# Patient Record
Sex: Female | Born: 1960 | Race: White | Hispanic: No | Smoking: Former smoker
Health system: Southern US, Community
[De-identification: ages and names within clinical notes are randomized; demographics above are authoritative.]

## PROBLEM LIST (undated history)

## (undated) DIAGNOSIS — I509 Heart failure, unspecified: Secondary | ICD-10-CM

## (undated) DIAGNOSIS — F329 Major depressive disorder, single episode, unspecified: Secondary | ICD-10-CM

## (undated) DIAGNOSIS — F32A Depression, unspecified: Secondary | ICD-10-CM

## (undated) DIAGNOSIS — G4730 Sleep apnea, unspecified: Secondary | ICD-10-CM

## (undated) DIAGNOSIS — E079 Disorder of thyroid, unspecified: Secondary | ICD-10-CM

## (undated) DIAGNOSIS — I10 Essential (primary) hypertension: Secondary | ICD-10-CM

## (undated) DIAGNOSIS — D649 Anemia, unspecified: Secondary | ICD-10-CM

## (undated) DIAGNOSIS — J449 Chronic obstructive pulmonary disease, unspecified: Secondary | ICD-10-CM

## (undated) DIAGNOSIS — E662 Morbid (severe) obesity with alveolar hypoventilation: Secondary | ICD-10-CM

## (undated) HISTORY — PX: ROTATOR CUFF REPAIR: SHX139

---

## 2007-09-11 ENCOUNTER — Ambulatory Visit (HOSPITAL_COMMUNITY): Admission: RE | Admit: 2007-09-11 | Discharge: 2007-09-12 | Payer: Self-pay | Admitting: Orthopedic Surgery

## 2010-08-18 NOTE — Discharge Summary (Signed)
NAMEIRVING, LUBBERS NO.:  [id number]   MEDICAL RECORD NO.:  [id number]          PATIENT TYPE:  OIB   LOCATION:  5022                         FACILITY:  [facility]   PHYSICIAN:  Almedia Balls. Ranell Patrick, M.D. DATE OF BIRTH:  Mar 11, 1961   DATE OF ADMISSION:  09/11/2007  DATE OF DISCHARGE:  09/12/2007                               DISCHARGE SUMMARY   ADMISSION DIAGNOSIS:  Left shoulder pain status post a rotator cuff  tear.   DISCHARGE DIAGNOSIS:  Left shoulder pain status post a rotator cuff tear  status post rotator cuff repair.   BRIEF HISTORY:  The patient is a 50 year old female injured herself in  January, worked with a fall and complained of continued left shoulder  pain.  The patient had known rotator cuff and presents for surgical  management of the rotator cuff tear of the left shoulder.   PROCEDURE:  The patient had left shoulder arthroscopy, subacromial  decompression, and rotator cuff repair by Dr. Malon Kindle on September 11, 2007.   ASSISTANT:  Donnie Coffin. Durwin Nora, P.A.   General anesthesia with interscalene block were used and no  complications.   HOSPITAL COURSE:  The patient was admitted on September 11, 2007 for the above-  stated procedure which she tolerated well.  After adequate time in the  postanesthesia care unit, she was transferred to 5000.  Postop day #1,  the patient complained of some mild numbness in the left shoulder,  questionable result of the continuation of her interscalene block.  Neurovascularly, she is intact distally.  Capillary refill is less 2  seconds.  Dressing was in place.  No signs of drainage or acute  bleeding.  Sling was in place.   DISCHARGE PLAN:  The patient will be discharged home on September 12, 2007.   ACTIVITIES:  No use of the left upper extremity until follow back up  with Dr. Malon Kindle.  Her condition is stable.  Her diet is low  sodium.   The patient has allergy to PREDNISONE.   DISCHARGE MEDICATIONS:  1. Flexeril 5  mg p.o. t.i.d. p.r.n.  2. Percocet 5/325 1-2 tablets q.4-6 h. p.r.n. pain.   FOLLOWUP:  The patient will follow back up with Dr. Malon Kindle in 2  weeks.  Her condition is stable.      Thomas B. Durwin Nora, P.A.      Almedia Balls. Ranell Patrick, M.D.  Electronically Signed    TBD/MEDQ  D:  09/12/2007  T:  09/12/2007  Job:  [id number]

## 2010-08-18 NOTE — Op Note (Signed)
Rebecca Bird, [name] NO.:  [id number]   MEDICAL RECORD NO.:  [id number]          PATIENT TYPE:  OIB   LOCATION:  5022                         FACILITY:  [facility]   PHYSICIAN:  Almedia Balls. Ranell Patrick, M.D. DATE OF BIRTH:  1960/10/01   DATE OF PROCEDURE:  09/11/2007  DATE OF DISCHARGE:                               OPERATIVE REPORT   PREOPERATIVE DIAGNOSIS:  Left shoulder rotator cuff tear.   POSTOPERATIVE DIAGNOSES:  1. Left shoulder rotator cuff tear.  2. Left shoulder superior labral tear, anterior to posterior.  3. Left shoulder biceps tear.   PROCEDURE PERFORMED:  Left shoulder arthroscopy with extensive  intraarticular debridment of torn superior labrum anterior to posterior,  arthroscopic biceps tenotomy followed by arthroscopic subacromial  decompression, and open rotator cuff repair.   ATTENDING SURGEON:  Almedia Balls. Ranell Patrick, MD   ASSISTANT:  Donnie Coffin. Dixon, PA.   ANESTHESIA:  General anesthesia plus interscalene block anesthesia was  used.   ESTIMATED BLOOD LOSS:  Minimal.   FLUIDS REPLACEMENT:  2300 mL of crystalloid.   INSTRUMENT COUNTS:  Correct.   COMPLICATIONS:  There were no complications.   Preoperative antibiotics were given.   INDICATIONS:  The patient is a 50 year old female with a work injury.  The patient fell directly on the left shoulder injuring her arm.  She  has had functional loss and pain since that injury.  She presents now  with an MRI documented rotator cuff tear for surgery to relieve pain and  restore function .  Informed consent was obtained.   DESCRIPTION OF PROCEDURE:  After an adequate level of anesthesia was  achieved, the patient was positioned in modified beach-chair position.  The left shoulder was sterilely prepped and draped in the usual manner.  We entered the shoulder arthroscopically using standard arthroscopic  portals including anterior and posterior lateral portals.  We identified  a lot of synovitis and  degenerative torn tissue within the shoulder.  There was an obvious proximal biceps tear in addition to the superior  labral tear, anterior to posterior representing an unstable complex in  which the entire structure subluxed into the joint, performed a biceps  tenotomy and labral debridement.  The patient's rotator cuff was  completely torn and retracted to the level of the glenoid. The patient's  posterior labrum was torn as well, performed a labral debridement there.  Subscapularis appeared normal.  The articular cartilage on the glenoid  and humeral head appeared to be in good condition.  We placed the scope  in subacromial space, performed a thorough bursectomy.  We were careful  to not disrupt the patient's os acromiale.  We performed just a lateral  debridement trying to get some of the lateral overhanging off the  lateral acromion, careful not to do a true acromioplasty and undermine  the os acromiale connection which appeared to be at the posterior aspect  of the clavicle per MRI.  At this point, with our decompression done, we  went ahead and made an incision, a fairly large incision for the  patient's size, she is a 167 kilo  woman, basically centered over the  anterolateral acromion.  Dissection carried sharply down through the  subcutaneous tissues.  We verified that the os acromiale was stable.  We  then made a mini open incision dividing the deltoid between the  anterolateral heads.  We identified a torn and retracted rotator cuff  tear, this was partially mobilizable.  We were able to bring some of the  torn rotator cuff tendon up lateral and forward and placed two 5.5 Bio-  Corkscrew anchors to restore the medial footprint.  There was a portion  of the anterior cuff that was torn and retracted, and was not fixable.  This could not come all the way over with the patient's arm at her side  to the rotator cuff footprint.  This represented the supraspinatus  tendon.  Thus, the  infraspinatus was repaired, supraspinatus was brought  over and still was connected to the infraspinatus, but was not actually  able to be moved over to its insertion.  We went ahead and also placed 2  sutures into the rotator cuff and brought those out over the  anterolateral humerus and used it by 4.5 PushLock anchor out on the  lateral humerus to restore that lateral portion of the footprint.  We  had a nice low-profile repair.  There still was missing anterior portion  of the rotator cuff as that was not able to be moved back in place  despite releasing it on both bursal and joint surfaces.  Subscapularis  was palpated and felt to be normal as well.  Following this, we took the  shoulder through a full range of motion.  We closed the deltoid to  itself with interrupted 0 Vicryl suture, figure-of-eight followed by  subcutaneous closure with 0 and 2-0 layered Vicryl.  Simple suture with  buried knots followed by 4-0 Monocryl for portals and the skins.  Steri-  Strips were applied followed by a sterile dressing.  The patient  tolerated the surgery well.      Almedia Balls. Ranell Patrick, M.D.  Electronically Signed     SRN/MEDQ  D:  09/11/2007  T:  09/12/2007  Job:  [id number]

## 2010-12-31 LAB — BASIC METABOLIC PANEL
BUN: 5 — ABNORMAL LOW
BUN: 9
CO2: 26
Calcium: 8.9
Creatinine, Ser: 0.73
GFR calc non Af Amer: >60
GFR calc non Af Amer: >60
Glucose, Bld: 100 — ABNORMAL HIGH
Glucose, Bld: 105 — ABNORMAL HIGH
Potassium: 4
Sodium: 140

## 2010-12-31 LAB — URINE MICROSCOPIC-ADD ON

## 2010-12-31 LAB — DIFFERENTIAL
Basophils Relative: 1
Eosinophils Absolute: 0.2
Lymphs Abs: 2.4
Monocytes Absolute: 0.6
Monocytes Relative: 6
Neutro Abs: 6.6
Neutrophils Relative %: 67

## 2010-12-31 LAB — URINALYSIS, ROUTINE W REFLEX MICROSCOPIC
Bilirubin Urine: NEGATIVE
Hgb urine dipstick: NEGATIVE
Nitrite: NEGATIVE
Specific Gravity, Urine: 1.025
pH: 6

## 2010-12-31 LAB — CBC
Platelets: 305
RDW: 15.3
WBC: 9.8

## 2010-12-31 LAB — TYPE AND SCREEN

## 2017-11-01 ENCOUNTER — Other Ambulatory Visit: Payer: Self-pay

## 2017-11-01 ENCOUNTER — Emergency Department: Payer: Medicare Other

## 2017-11-01 ENCOUNTER — Inpatient Hospital Stay
Admission: EM | Admit: 2017-11-01 | Discharge: 2017-11-05 | DRG: 189 | Disposition: A | Discharge status: Skilled Nursing Facility | Payer: Medicare Other | Attending: Internal Medicine | Admitting: Internal Medicine

## 2017-11-01 DIAGNOSIS — I110 Hypertensive heart disease with heart failure: Secondary | ICD-10-CM | POA: Diagnosis present

## 2017-11-01 DIAGNOSIS — F329 Major depressive disorder, single episode, unspecified: Secondary | ICD-10-CM | POA: Diagnosis present

## 2017-11-01 DIAGNOSIS — J9621 Acute and chronic respiratory failure with hypoxia: Principal | ICD-10-CM | POA: Diagnosis present

## 2017-11-01 DIAGNOSIS — E875 Hyperkalemia: Secondary | ICD-10-CM | POA: Diagnosis present

## 2017-11-01 DIAGNOSIS — D638 Anemia in other chronic diseases classified elsewhere: Secondary | ICD-10-CM | POA: Diagnosis present

## 2017-11-01 DIAGNOSIS — G9349 Other encephalopathy: Secondary | ICD-10-CM | POA: Diagnosis present

## 2017-11-01 DIAGNOSIS — Z9981 Dependence on supplemental oxygen: Secondary | ICD-10-CM

## 2017-11-01 DIAGNOSIS — G4730 Sleep apnea, unspecified: Secondary | ICD-10-CM | POA: Diagnosis present

## 2017-11-01 DIAGNOSIS — Z6841 Body Mass Index (BMI) 40.0 and over, adult: Secondary | ICD-10-CM

## 2017-11-01 DIAGNOSIS — E039 Hypothyroidism, unspecified: Secondary | ICD-10-CM | POA: Diagnosis present

## 2017-11-01 DIAGNOSIS — X58XXXA Exposure to other specified factors, initial encounter: Secondary | ICD-10-CM | POA: Diagnosis present

## 2017-11-01 DIAGNOSIS — Z87891 Personal history of nicotine dependence: Secondary | ICD-10-CM

## 2017-11-01 DIAGNOSIS — I5032 Chronic diastolic (congestive) heart failure: Secondary | ICD-10-CM | POA: Diagnosis present

## 2017-11-01 DIAGNOSIS — Z79899 Other long term (current) drug therapy: Secondary | ICD-10-CM | POA: Diagnosis not present

## 2017-11-01 DIAGNOSIS — R52 Pain, unspecified: Secondary | ICD-10-CM

## 2017-11-01 DIAGNOSIS — T368X5A Adverse effect of other systemic antibiotics, initial encounter: Secondary | ICD-10-CM | POA: Diagnosis present

## 2017-11-01 DIAGNOSIS — Z7951 Long term (current) use of inhaled steroids: Secondary | ICD-10-CM | POA: Diagnosis not present

## 2017-11-01 DIAGNOSIS — E662 Morbid (severe) obesity with alveolar hypoventilation: Secondary | ICD-10-CM | POA: Diagnosis present

## 2017-11-01 DIAGNOSIS — J441 Chronic obstructive pulmonary disease with (acute) exacerbation: Secondary | ICD-10-CM | POA: Diagnosis present

## 2017-11-01 DIAGNOSIS — E874 Mixed disorder of acid-base balance: Secondary | ICD-10-CM | POA: Diagnosis present

## 2017-11-01 DIAGNOSIS — J9602 Acute respiratory failure with hypercapnia: Secondary | ICD-10-CM | POA: Diagnosis present

## 2017-11-01 DIAGNOSIS — S43014D Anterior dislocation of right humerus, subsequent encounter: Secondary | ICD-10-CM | POA: Diagnosis not present

## 2017-11-01 DIAGNOSIS — J9622 Acute and chronic respiratory failure with hypercapnia: Secondary | ICD-10-CM | POA: Diagnosis present

## 2017-11-01 DIAGNOSIS — J9600 Acute respiratory failure, unspecified whether with hypoxia or hypercapnia: Secondary | ICD-10-CM

## 2017-11-01 HISTORY — DX: Essential (primary) hypertension: I10

## 2017-11-01 HISTORY — DX: Major depressive disorder, single episode, unspecified: F32.9

## 2017-11-01 HISTORY — DX: Anemia, unspecified: D64.9

## 2017-11-01 HISTORY — DX: Depression, unspecified: F32.A

## 2017-11-01 HISTORY — DX: Sleep apnea, unspecified: G47.30

## 2017-11-01 HISTORY — DX: Heart failure, unspecified: I50.9

## 2017-11-01 HISTORY — DX: Chronic obstructive pulmonary disease, unspecified: J44.9

## 2017-11-01 HISTORY — DX: Disorder of thyroid, unspecified: E07.9

## 2017-11-01 LAB — BLOOD GAS, VENOUS
ACID-BASE EXCESS: 23.8 mmol/L — AB (ref 0.0–2.0)
BICARBONATE: 56.4 mmol/L — AB (ref 20.0–28.0)
O2 SAT: 87.3 %
PO2 VEN: 60 mmHg — AB (ref 32.0–45.0)
Patient temperature: 37
pCO2, Ven: 120 mmHg (ref 44.0–60.0)
pH, Ven: 7.27 (ref 7.250–7.430)

## 2017-11-01 LAB — COMPREHENSIVE METABOLIC PANEL
ALT: 44 U/L (ref 0–44)
AST: 37 U/L (ref 15–41)
Albumin: 3.4 g/dL — ABNORMAL LOW (ref 3.5–5.0)
Alkaline Phosphatase: 75 U/L (ref 38–126)
Anion gap: 8 (ref 5–15)
BUN: 30 mg/dL — ABNORMAL HIGH (ref 6–20)
CALCIUM: 8.7 mg/dL — AB (ref 8.9–10.3)
CHLORIDE: 87 mmol/L — AB (ref 98–111)
CO2: 47 mmol/L — ABNORMAL HIGH (ref 22–32)
Creatinine, Ser: 1.05 mg/dL — ABNORMAL HIGH (ref 0.44–1.00)
GFR, EST NON AFRICAN AMERICAN: 58 mL/min — AB (ref >60)
Glucose, Bld: 120 mg/dL — ABNORMAL HIGH (ref 70–99)
Potassium: 5.3 mmol/L — ABNORMAL HIGH (ref 3.5–5.1)
Sodium: 142 mmol/L (ref 135–145)
Total Bilirubin: 1.2 mg/dL (ref 0.3–1.2)
Total Protein: 6.8 g/dL (ref 6.5–8.1)

## 2017-11-01 LAB — GLUCOSE, CAPILLARY: Glucose-Capillary: 131 mg/dL — ABNORMAL HIGH (ref 70–99)

## 2017-11-01 LAB — MRSA PCR SCREENING: MRSA by PCR: NEGATIVE

## 2017-11-01 LAB — CBC
HCT: 36.5 % (ref 35.0–47.0)
Hemoglobin: 11.6 g/dL — ABNORMAL LOW (ref 12.0–16.0)
MCH: 32.7 pg (ref 26.0–34.0)
MCHC: 32 g/dL (ref 32.0–36.0)
MCV: 102.4 fL — AB (ref 80.0–100.0)
PLATELETS: 176 10*3/uL (ref 150–440)
RBC: 3.56 MIL/uL — AB (ref 3.80–5.20)
RDW: 18.5 % — AB (ref 11.5–14.5)
WBC: 5.9 10*3/uL (ref 3.6–11.0)

## 2017-11-01 LAB — HEMOGLOBIN A1C
Hgb A1c MFr Bld: 4.8 % (ref 4.8–5.6)
Mean Plasma Glucose: 91.06 mg/dL

## 2017-11-01 LAB — TSH: TSH: 12.555 u[IU]/mL — ABNORMAL HIGH (ref 0.350–4.500)

## 2017-11-01 LAB — TROPONIN I

## 2017-11-01 MED ORDER — ACETAMINOPHEN 650 MG RE SUPP: 650.0000 mg | Freq: Four times a day (QID) | RECTAL | Status: DC | PRN

## 2017-11-01 MED ORDER — ONDANSETRON HCL 4 MG/2ML IJ SOLN: 4.0000 mg | Freq: Four times a day (QID) | INTRAMUSCULAR | Status: DC | PRN

## 2017-11-01 MED ORDER — IPRATROPIUM-ALBUTEROL 0.5-2.5 (3) MG/3ML IN SOLN
3.0000 mL | Freq: Four times a day (QID) | RESPIRATORY_TRACT | Status: DC | PRN
  Administered 2017-11-01: 3 mL via RESPIRATORY_TRACT
  Filled 2017-11-01: qty 3

## 2017-11-01 MED ORDER — METHYLPREDNISOLONE SODIUM SUCC 125 MG IJ SOLR
125.0000 mg | Freq: Once | INTRAMUSCULAR | Status: AC
  Administered 2017-11-01: 125 mg via INTRAVENOUS
  Filled 2017-11-01: qty 2

## 2017-11-01 MED ORDER — FERROUS SULFATE 325 (65 FE) MG PO TABS
325.0000 mg | ORAL_TABLET | Freq: Every day | ORAL | Status: DC
  Administered 2017-11-02 – 2017-11-05 (×4): 325 mg via ORAL
  Filled 2017-11-01 (×4): qty 1

## 2017-11-01 MED ORDER — FUROSEMIDE 40 MG PO TABS
120.0000 mg | ORAL_TABLET | Freq: Every day | ORAL | Status: DC
  Administered 2017-11-01 – 2017-11-05 (×5): 120 mg via ORAL
  Filled 2017-11-01 (×2): qty 3
  Filled 2017-11-01: qty 6
  Filled 2017-11-01 (×2): qty 3
  Filled 2017-11-01: qty 6
  Filled 2017-11-01 (×2): qty 3

## 2017-11-01 MED ORDER — ONDANSETRON HCL 4 MG PO TABS: 4.0000 mg | ORAL_TABLET | Freq: Four times a day (QID) | ORAL | Status: DC | PRN

## 2017-11-01 MED ORDER — LEVOTHYROXINE SODIUM 50 MCG PO TABS
50.0000 ug | ORAL_TABLET | Freq: Every day | ORAL | Status: DC
  Administered 2017-11-01: 50 ug via ORAL
  Filled 2017-11-01: qty 1

## 2017-11-01 MED ORDER — LEVOFLOXACIN IN D5W 750 MG/150ML IV SOLN
750.0000 mg | INTRAVENOUS | Status: DC
  Administered 2017-11-01 – 2017-11-02 (×2): 750 mg via INTRAVENOUS
  Filled 2017-11-01 (×2): qty 150

## 2017-11-01 MED ORDER — IPRATROPIUM-ALBUTEROL 0.5-2.5 (3) MG/3ML IN SOLN
3.0000 mL | Freq: Once | RESPIRATORY_TRACT | Status: AC
  Administered 2017-11-01: 3 mL via RESPIRATORY_TRACT

## 2017-11-01 MED ORDER — TRAMADOL HCL 50 MG PO TABS
50.0000 mg | ORAL_TABLET | Freq: Four times a day (QID) | ORAL | Status: DC | PRN
  Administered 2017-11-03 – 2017-11-04 (×4): 50 mg via ORAL
  Filled 2017-11-01 (×5): qty 1

## 2017-11-01 MED ORDER — ENOXAPARIN SODIUM 40 MG/0.4ML ~~LOC~~ SOLN
40.0000 mg | Freq: Two times a day (BID) | SUBCUTANEOUS | Status: DC
  Administered 2017-11-01 – 2017-11-05 (×9): 40 mg via SUBCUTANEOUS
  Filled 2017-11-01 (×9): qty 0.4

## 2017-11-01 MED ORDER — ACETAMINOPHEN 325 MG PO TABS
650.0000 mg | ORAL_TABLET | Freq: Four times a day (QID) | ORAL | Status: DC | PRN
  Administered 2017-11-03 – 2017-11-04 (×2): 650 mg via ORAL
  Filled 2017-11-01 (×2): qty 2

## 2017-11-01 MED ORDER — METHYLPREDNISOLONE SODIUM SUCC 125 MG IJ SOLR
60.0000 mg | Freq: Four times a day (QID) | INTRAMUSCULAR | Status: DC
  Administered 2017-11-01 – 2017-11-05 (×17): 60 mg via INTRAVENOUS
  Filled 2017-11-01 (×17): qty 2

## 2017-11-01 MED ORDER — CARVEDILOL 3.125 MG PO TABS
3.1250 mg | ORAL_TABLET | Freq: Two times a day (BID) | ORAL | Status: DC
  Administered 2017-11-01 – 2017-11-05 (×8): 3.125 mg via ORAL
  Filled 2017-11-01 (×9): qty 1

## 2017-11-01 MED ORDER — POTASSIUM CHLORIDE CRYS ER 10 MEQ PO TBCR
10.0000 meq | EXTENDED_RELEASE_TABLET | Freq: Every day | ORAL | Status: DC
  Administered 2017-11-01 – 2017-11-05 (×5): 10 meq via ORAL
  Filled 2017-11-01 (×6): qty 1

## 2017-11-01 MED ORDER — ENOXAPARIN SODIUM 40 MG/0.4ML ~~LOC~~ SOLN: 40.0000 mg | SUBCUTANEOUS | Status: DC

## 2017-11-01 MED ORDER — BUDESONIDE 0.25 MG/2ML IN SUSP
0.2500 mg | Freq: Two times a day (BID) | RESPIRATORY_TRACT | Status: DC
  Administered 2017-11-01 – 2017-11-05 (×9): 0.25 mg via RESPIRATORY_TRACT
  Filled 2017-11-01 (×9): qty 2

## 2017-11-01 MED ORDER — DOCUSATE SODIUM 100 MG PO CAPS
100.0000 mg | ORAL_CAPSULE | Freq: Two times a day (BID) | ORAL | Status: DC
  Administered 2017-11-02 – 2017-11-05 (×7): 100 mg via ORAL
  Filled 2017-11-01 (×9): qty 1

## 2017-11-01 MED ORDER — CITALOPRAM HYDROBROMIDE 20 MG PO TABS
40.0000 mg | ORAL_TABLET | Freq: Every day | ORAL | Status: DC
  Administered 2017-11-01 – 2017-11-05 (×5): 40 mg via ORAL
  Filled 2017-11-01 (×5): qty 2

## 2017-11-01 MED ORDER — IPRATROPIUM-ALBUTEROL 0.5-2.5 (3) MG/3ML IN SOLN
RESPIRATORY_TRACT | Status: AC
  Administered 2017-11-01: 3 mL via RESPIRATORY_TRACT
  Filled 2017-11-01: qty 6

## 2017-11-01 NOTE — Consult Note (Signed)
[facility] Pulmonary Medicine Consultation      Name: Rebecca Bird MRN: [id number] DOB: April 28, 1960    ADMISSION DATE:  11/01/2017 CONSULTATION DATE:  11/01/2017  REFERRING MD :  DIAMOND   CHIEF COMPLAINT:   Shortness of breath   HISTORY OF PRESENT ILLNESS   57 year old female with morbid obesity, CHF and COPD who presents to the emergency department from [facility]. Patient was found with her CPAP malpositioned and shortness of breath with cyanosis. Upon arrival to the ED patient was noted to have O2 saturation in the 60s and was placed on BiPAP at 100% FiO2. Patient states that her right shoulder is bothering her today and she needs an MRI. She tells me that she is also feeling more SOB than usual. She denies cough, chest pain, and fevers. Patient tells me that she came from her facility. History is provided by the patient and her caregiver, her Murriel Hopper. Her Aunt states that her shoulder was dislocated a few weeks ago and has been attempted to be reduced without success. She states that they have not been able to obtain an MRI secondary to body habitus. Her Aunt states that she has become increasingly dependent on others since her shoulder injury as she is right hand dominant.    SIGNIFICANT EVENTS   See above    PAST MEDICAL HISTORY    :  Past Medical History:  Diagnosis Date  . Anemia   . CHF (congestive heart failure) (HCC)   . COPD (chronic obstructive pulmonary disease) (HCC)   . Depression   . Hypertension   . Sleep apnea   . Thyroid disease    History reviewed. No pertinent surgical history. Prior to Admission medications   Medication Sig Start Date End Date Taking? Authorizing Provider  acetaminophen (TYLENOL) 500 MG tablet Take 500 mg by mouth every 8 (eight) hours as needed for mild pain.    Yes [provider]  albuterol (PROVENTIL HFA;VENTOLIN HFA) 108 (90 Base) MCG/ACT inhaler Inhale 2 puffs into the lungs every 4 (four) hours as  needed for shortness of breath.    Yes [provider]  carvedilol (COREG) 3.125 MG tablet Take 3.125 mg by mouth 2 (two) times daily with a meal.   Yes [provider]  citalopram (CELEXA) 20 MG tablet Take 40 mg by mouth daily.   Yes [provider]  diclofenac sodium (VOLTAREN) 1 % GEL Apply 4 g topically 4 (four) times daily.   Yes [provider]  ferrous sulfate 325 (65 FE) MG tablet Take 325 mg by mouth daily with breakfast.   Yes [provider]  furosemide (LASIX) 80 MG tablet Take 120 mg by mouth daily.   Yes [provider]  ipratropium-albuterol (DUONEB) 0.5-2.5 (3) MG/3ML SOLN Take 3 mLs by nebulization every 12 (twelve) hours as needed (shortness of breath).    Yes [provider]  levothyroxine (SYNTHROID, LEVOTHROID) 50 MCG tablet Take 50 mcg by mouth daily before breakfast.   Yes [provider]  potassium chloride (K-DUR) 10 MEQ tablet Take 10 mEq by mouth daily.    Yes [provider]  traMADol (ULTRAM) 50 MG tablet Take 50 mg by mouth every 6 (six) hours as needed for moderate pain.    Yes [provider]   No Known Allergies   FAMILY HISTORY   No family history on file.    SOCIAL HISTORY    reports that she has quit smoking. She has  never used smokeless tobacco. Her alcohol and drug histories are not on file.  Review of Systems  Constitutional: Negative for chills, diaphoresis, fever and malaise/fatigue.  HENT: Negative for congestion, ear pain, sinus pain and sore throat.   Eyes: Negative for pain and discharge.  Respiratory: Positive for cough and shortness of breath. Negative for hemoptysis, sputum production and wheezing.   Cardiovascular: Negative for chest pain and leg swelling.  Gastrointestinal: Negative for abdominal pain, blood in stool, constipation, diarrhea, nausea and vomiting.  Genitourinary: Negative for dysuria, frequency and urgency.  Musculoskeletal:  Negative for back pain and neck pain.       Right shoulder pain  Skin: Negative for itching and rash.  Neurological: Positive for weakness (right arm (due to shoulder dislocation)). Negative for dizziness, tingling, tremors, speech change and headaches.      VITAL SIGNS    Temp:  [98.1 F (36.7 C)-98.4 F (36.9 C)] 98.1 F (36.7 C) (07/30 0850) Pulse Rate:  [81-95] 91 (07/30 0850) Resp:  [20-26] 23 (07/30 0850) BP: (113-143)/(64-108) 143/106 (07/30 0850) SpO2:  [92 %-100 %] 100 % (07/30 0850) FiO2 (%):  [60 %] 60 % (07/30 0850) Weight:  [199.3 kg (439 lb 6 oz)-216.3 kg (476 lb 13.7 oz)] 199.3 kg (439 lb 6 oz) (07/30 0850)    VENTILATOR SETTINGS: BiPAP FiO2 (%):  [60 %] 60 % INTAKE / OUTPUT: No intake or output data in the 24 hours ending 11/01/17 0946     PHYSICAL EXAM   Physical Exam  Constitutional: She is oriented to person, place, and time.  Morbidly obese female, on BIPAP in hospital bed  HENT:  Head: Normocephalic and atraumatic.  Eyes: Pupils are equal, round, and reactive to light. EOM are normal.  Cardiovascular:  S1 and S2. Regular rate and rhythm. No murmurs, rubs or gallops. 2+ dorsalis pedis and radial pulses bilaterally  Pulmonary/Chest: Tachypnea noted. She is in respiratory distress.  Decreased air movement, bilateral rhonchi. No rales  Abdominal:  Obese, soft abdomen. Non-distended, no tenderness to palpation. Positive bowel sounds.   Musculoskeletal:       Right lower leg: She exhibits edema (1+ pedal).       Left lower leg: She exhibits edema (1+ pedal).  Lymphadenopathy:    She has no cervical adenopathy.  Neurological: She is alert and oriented to person, place, and time. No cranial nerve deficit.  Skin: Skin is warm and dry. Capillary refill takes less than 2 seconds. No rash noted. No erythema.  Psychiatric:  Lethargic       LABS   LABS:  CBC Recent Labs  Lab 11/01/17 0402  WBC 5.9  HGB 11.6*  HCT 36.5  PLT 176    Coag's No results for input(s): APTT, INR in the last 168 hours. BMET Recent Labs  Lab 11/01/17 0402  NA 142  K 5.3*  CL 87*  CO2 47*  BUN 30*  CREATININE 1.05*  GLUCOSE 120*   Electrolytes Recent Labs  Lab 11/01/17 0402  CALCIUM 8.7*   Sepsis Markers No results for input(s): LATICACIDVEN, PROCALCITON, O2SATVEN in the last 168 hours. ABG No results for input(s): PHART, PCO2ART, PO2ART in the last 168 hours. Liver Enzymes Recent Labs  Lab 11/01/17 0402  AST 37  ALT 44  ALKPHOS 75  BILITOT 1.2  ALBUMIN 3.4*   Cardiac Enzymes Recent Labs  Lab 11/01/17 0402  TROPONINI <0.03   Glucose Recent Labs  Lab 11/01/17 0831  GLUCAP 131*     No results  found for this or any previous visit (from the past 240 hour(s)).   Current Facility-Administered Medications:  .  acetaminophen (TYLENOL) tablet 650 mg, 650 mg, Oral, Q6H PRN **OR** acetaminophen (TYLENOL) suppository 650 mg, 650 mg, Rectal, Q6H PRN, Arnaldo Natal, MD .  budesonide (PULMICORT) nebulizer solution 0.25 mg, 0.25 mg, Nebulization, BID, Conforti, John, DO .  carvedilol (COREG) tablet 3.125 mg, 3.125 mg, Oral, BID WC, Arnaldo Natal, MD .  citalopram (CELEXA) tablet 40 mg, 40 mg, Oral, Daily, Arnaldo Natal, MD .  docusate sodium (COLACE) capsule 100 mg, 100 mg, Oral, BID, Arnaldo Natal, MD .  enoxaparin (LOVENOX) injection 40 mg, 40 mg, Subcutaneous, Q12H, Sudini, Srikar, MD .  ferrous sulfate tablet 325 mg, 325 mg, Oral, Q breakfast, Arnaldo Natal, MD .  furosemide (LASIX) tablet 120 mg, 120 mg, Oral, Daily, Arnaldo Natal, MD .  ipratropium-albuterol (DUONEB) 0.5-2.5 (3) MG/3ML nebulizer solution 3 mL, 3 mL, Nebulization, Q6H PRN, Arnaldo Natal, MD .  levofloxacin (LEVAQUIN) IVPB 750 mg, 750 mg, Intravenous, Q24H, Conforti, John, DO .  levothyroxine (SYNTHROID, LEVOTHROID) tablet 50 mcg, 50 mcg, Oral, QAC breakfast, Arnaldo Natal, MD .  methylPREDNISolone sodium  succinate (SOLU-MEDROL) 125 mg/2 mL injection 60 mg, 60 mg, Intravenous, Q6H, Conforti, John, DO .  ondansetron (ZOFRAN) tablet 4 mg, 4 mg, Oral, Q6H PRN **OR** ondansetron (ZOFRAN) injection 4 mg, 4 mg, Intravenous, Q6H PRN, Arnaldo Natal, MD .  potassium chloride (K-DUR,KLOR-CON) CR tablet 10 mEq, 10 mEq, Oral, Daily, Arnaldo Natal, MD .  traMADol Janean Sark) tablet 50 mg, 50 mg, Oral, Q6H PRN, Arnaldo Natal, MD  IMAGING    Dg Chest Portable 1 View  Result Date: 11/01/2017 CLINICAL DATA:  Shortness of breath. EXAM: PORTABLE CHEST 1 VIEW COMPARISON:  None. FINDINGS: The heart is enlarged. Vascular congestion and possible central pulmonary edema. No evidence of focal airspace disease, large pleural effusion or pneumothorax. The right humeral head appears anterior to the glenoid but suboptimally assessed. Soft tissue attenuation from body habitus limits detailed assessment. IMPRESSION: 1. Cardiomegaly with vascular congestion and possible central pulmonary edema. 2. Right humeral head appears anteriorly positioned with respect to glenoid, however suboptimally assessed on this portable view, and this may be positioning. Recommend correlation with physical exam. Dedicated shoulder radiographs could be considered based on clinical concern. Electronically Signed   By: Rubye Oaks M.D.   On: 11/01/2017 05:07    MICRO DATA: MRSA PCR: pending    ANTIMICROBIALS: levaquin    ASSESSMENT/PLAN   57 year old female with morbid obesity, CHF, COPD (4L O2 chronic via nasal cannula), and OHS who presents to the emergency department for evaluation of shortness of breath from [employer]. On exam, patient is on BiPAP and is alert and oriented, but she believes she is here for an MRI of her shoulder.   Patient has had multiple hospitalizations recently for shortness of breath within the past month at [facility]. Patient did require intubation at the beginning of July. Patient is  reported to have dislocated her shoulder while repositioning herself in bed. Reduction was unable to be corrected with closed manipulation in the OR per chart review.  Patient's caregiver has concerns about the care she is receiving at her current facility. Will consult case management.    PULMONARY A: Acute on chronic hypoxic and hypercapnic respiratory failure secondary to OHS and COPD with exacerbation P:  Will add solumedrol, budesonide, and Levaquin -continue NEB treatment -  wean BiPAP as tolerated, CPAP at night. 4L of chronic O2 at baseline -CXR: shows decreased inspiratory effort and limited visibility secondary to body habitus. R >L opacities w/vascular congestion and possible pneumonia. Chronic right anterior shoulder dislocation.     CARDIOVASCULAR A: Congestive diastolic heart failure and hypertension P: Echo from 10/2017 shows EF 55%, with mild LVH -Diuresis with lasix as blood pressure will support -carvedilol for hypertension outpatient, continue  RENAL A:  Hyperkalemia P: Potassium: 5.3, will monitor with diuresis, will likely decrease with furosemide -Creatinine 1.05, will monitor with diuresis  GASTROINTESTINAL -no acute concerns   HEMATOLOGIC Lovenox for DVT prophylaxis  INFECTIOUS A:  COPD exacerbation with possible pneumonia P:  Will start Levaquin  ENDOCRINE A: Hypothyroidism P: Continue synthroid HbA1c ordered, monitor CBG while on solumedrol   NEUROLOGIC A:  Acute encephalopathy secondary to hypercapnia P:  Will monitor as acute hypercapnia resolves and respiratory status improves. Patient reported to have some underlying minimal confusion at baseline.      I have personally obtained a history, examined the patient, evaluated laboratory and independently reviewed  imaging results, formulated the assessment and plan and placed orders.  The Patient requires high complexity decision making for assessment and support, frequent evaluation and  titration of therapies, application of advanced monitoring technologies and extensive interpretation of multiple databases. Critical Care Time devoted to patient care services described in this note is 52 minutes.   Overall, patient is critically ill, prognosis is guarded.

## 2017-11-01 NOTE — Progress Notes (Signed)
Received report from EarlingLori, Charity fundraiserN. Pt arrived in ICU from the ED on BIPAP 60%. SPO2 100%. Pt denies SOB, lungs sounds clear and diminished. Pt A&O X4, with delayed responses and forgetful. States that she wants an MRI of her right shoulder, explains that it was dislocated prior to admission a week ago in another facility. No swelling and limited ROM or the right arm, denies pain at this time. Dr, conforti notified. Will continue to monitor.

## 2017-11-01 NOTE — Progress Notes (Signed)
Chaplain received OR for prayer requested. Chaplain went to patient room and family and nurse were with her. Daughter(Danielle and Son in [employer] account plannerlaw [place]). The nurse was also in there and she said there is Chaplain. Chaplain introduced herself and patient introduce family. Chaplain ask patient was there anything in particular she desired me her to pray for. Patient said yes, family and ya'll that her family get through this situation. Chaplain said ok and pray with patient and family. The daughter asked patient if she wanted them to leave so she could have privacy with Chaplain. Patient said no. Chaplain told patient if she need her she could tell the nurse to page her and she would return. Patient said ok.

## 2017-11-01 NOTE — ED Provider Notes (Addendum)
[facility] Emergency Department Provider Note    First MD Initiated Contact with Patient 11/01/17 0407     (approximate)  I have reviewed the triage vital signs and the nursing notes.   HISTORY  Chief Complaint Shortness of Breath    HPI Rebecca Bird is a 57 y.o. female low list of chronic medical conditions including CHF hypertension depression and COPD presents to the emergency department via EMS from [facility] secondary to dyspnea.  Per EMS they were called to [facility] secondary to respiratory distress and on their arrival they noted that the patient's CPAP was malpositioned with an oxygen saturation of 60%.  EMS states that they reposition the patient's CPAP with oxygen level increased to 88%.  On arrival to the emergency department the patient with increased work of breathing apparent tachypnea with somnolence  Past Medical History:  Diagnosis Date  . Anemia   . CHF (congestive heart failure) (HCC)   . COPD (chronic obstructive pulmonary disease) (HCC)   . Depression   . Hypertension   . Sleep apnea   . Thyroid disease     There are no active problems to display for this patient.   History reviewed. No pertinent surgical history.  Prior to Admission medications   Medication Sig Start Date End Date Taking? Authorizing Provider  acetaminophen (TYLENOL) 500 MG tablet Take 500 mg by mouth every 8 (eight) hours as needed for moderate pain.   Yes [provider]  albuterol (PROVENTIL HFA;VENTOLIN HFA) 108 (90 Base) MCG/ACT inhaler Inhale 2 puffs into the lungs every 4 (four) hours as needed for wheezing or shortness of breath.   Yes [provider]  carvedilol (COREG) 3.125 MG tablet Take 3.125 mg by mouth 2 (two) times daily with a meal.   Yes [provider]  citalopram (CELEXA) 20 MG tablet Take 40 mg by mouth daily.   Yes [provider]  diclofenac sodium (VOLTAREN) 1 % GEL Apply 4 g  topically 4 (four) times daily.   Yes [provider]  ferrous sulfate 325 (65 FE) MG tablet Take 325 mg by mouth daily with breakfast.   Yes [provider]  furosemide (LASIX) 80 MG tablet Take 120 mg by mouth daily.   Yes [provider]  ipratropium-albuterol (DUONEB) 0.5-2.5 (3) MG/3ML SOLN Take 3 mLs by nebulization every 12 (twelve) hours as needed.   Yes [provider]  levothyroxine (SYNTHROID, LEVOTHROID) 50 MCG tablet Take 50 mcg by mouth daily before breakfast.   Yes [provider]  potassium chloride (MICRO-K) 10 MEQ CR capsule Take 10 mEq by mouth daily.   Yes [provider]  traMADol (ULTRAM) 50 MG tablet Take by mouth every 6 (six) hours as needed.   Yes [provider]    Allergies  No family history on file.  Social History Social History   Tobacco Use  . Smoking status: Former Games developer  . Smokeless tobacco: Never Used  Substance Use Topics  . Alcohol use: Not on file  . Drug use: Not on file    Review of Systems Constitutional: No fever/chills Eyes: No visual changes. ENT: No sore throat. Cardiovascular: Denies chest pain. Respiratory: Positive for shortness of breath. Gastrointestinal: No abdominal pain.  No nausea, no vomiting.  No diarrhea.  No constipation. Genitourinary: Negative for dysuria. Musculoskeletal: Negative for neck pain.  Negative for back pain. Integumentary: Negative for rash. Neurological: Negative for headaches, focal weakness or numbness.  ____________________________________________   PHYSICAL EXAM:  VITAL SIGNS: ED Triage Vitals  Enc Vitals Group     BP 11/01/17 0347 (!) 127/108     Pulse Rate 11/01/17 0347 89     Resp 11/01/17 0347 20     Temp 11/01/17 0356 98.4 F (36.9 C)     Temp Source 11/01/17 0356 Oral     SpO2 11/01/17 0340 94 %     Weight 11/01/17 0348 (!) 216.3 kg (476 lb 13.7 oz)     Height 11/01/17 0348 1.676 m (5\' 6" )     Head Circumference --       Peak Flow --      Pain Score 11/01/17 0348 10     Pain Loc --      Pain Edu? --      Excl. in GC? --      Constitutional: Very somnolent but aroused by verbal stimuli.  Apparent respiratory difficulty Eyes: Conjunctivae are normal. Head: Atraumatic. Mouth/Throat: Mucous membranes are dry  Oropharynx non-erythematous. Neck: No stridor. Cardiovascular: Normal rate, regular rhythm. Good peripheral circulation. Grossly normal heart sounds. Respiratory: Tachypnea, speaking 3 word phrases, pursed lip breathing, bibasilar rhonchi Gastrointestinal: Soft and nontender. No distention.  Musculoskeletal: No lower extremity tenderness nor edema. No gross deformities of extremities. Neurologic:  Normal speech and language. No gross focal neurologic deficits are appreciated.  Skin:  Skin is warm, dry and intact. No rash noted. Psychiatric: Mood and affect are normal. Speech and behavior are normal.  ____________________________________________   LABS (all labs ordered are listed, but only abnormal results are displayed)  Labs Reviewed  BLOOD GAS, VENOUS - Abnormal; Notable for the following components:      Result Value   pCO2, Ven 120 (*)    pO2, Ven 60.0 (*)    Bicarbonate 56.4 (*)    Acid-Base Excess 23.8 (*)    All other components within normal limits  CBC - Abnormal; Notable for the following components:   RBC 3.56 (*)    Hemoglobin 11.6 (*)    MCV 102.4 (*)    RDW 18.5 (*)    All other components within normal limits  COMPREHENSIVE METABOLIC PANEL - Abnormal; Notable for the following components:   Potassium 5.3 (*)    Chloride 87 (*)    CO2 47 (*)    Glucose, Bld 120 (*)    BUN 30 (*)    Creatinine, Ser 1.05 (*)    Calcium 8.7 (*)    Albumin 3.4 (*)    GFR calc non Af Amer 58 (*)    All other components within normal limits  TROPONIN I   ____________________________________________  EKG  ED ECG REPORT I, [place] N Thersea [place], the attending physician, personally  viewed and interpreted this ECG.   Date: 11/01/2017  EKG Time: 3:46 AM  Rate: 83  Rhythm: Normal sinus rhythm with right atrial hypertrophy  Axis: Normal  Intervals: Normal  ST&T Change: None  ____________________________________________  RADIOLOGY I, [place] Kendal Raffo, personally viewed and evaluated these images (plain radiographs) as part of my medical decision making, as well as reviewing the written report by the radiologist.  ED MD interpretation: Cardiomegaly with vascular congestion anterior dislocation of the right humerus  Official radiology report(s): Dg Chest Portable 1 View  Result Date: 11/01/2017 CLINICAL DATA:  Shortness of breath. EXAM: PORTABLE CHEST 1 VIEW COMPARISON:  None. FINDINGS: The heart is enlarged. Vascular congestion and possible central pulmonary edema. No evidence of focal airspace disease, large pleural  effusion or pneumothorax. The right humeral head appears anterior to the glenoid but suboptimally assessed. Soft tissue attenuation from body habitus limits detailed assessment. IMPRESSION: 1. Cardiomegaly with vascular congestion and possible central pulmonary edema. 2. Right humeral head appears anteriorly positioned with respect to glenoid, however suboptimally assessed on this portable view, and this may be positioning. Recommend correlation with physical exam. Dedicated shoulder radiographs could be considered based on clinical concern. Electronically Signed   By: Rubye OaksMelanie  Ehinger M.D.   On: 11/01/2017 05:07    ____________________________________________      .Critical Care Performed by: Darci CurrentBrown, Sherrill N, MD Authorized by: Darci CurrentBrown, [place] N, MD   Critical care provider statement:    Critical care time (minutes):  30   Critical care time was exclusive of:  Separately billable procedures and treating other patients   Critical care was necessary to treat or prevent imminent or life-threatening deterioration of the following conditions:   Respiratory failure   Critical care was time spent personally by me on the following activities:  Development of treatment plan with patient or surrogate, discussions with consultants, evaluation of patient's response to treatment, examination of patient, obtaining history from patient or surrogate, ordering and performing treatments and interventions, ordering and review of laboratory studies, ordering and review of radiographic studies, pulse oximetry, re-evaluation of patient's condition and review of old charts   I assumed direction of critical care for this patient from another provider in my specialty: no       ____________________________________________   INITIAL IMPRESSION / ASSESSMENT AND PLAN / ED COURSE  As part of my medical decision making, I reviewed the following data within the electronic MEDICAL RECORD NUMBER   57 year old female presenting to the emergency department apparent respiratory difficulty patient noted to be hypoxic on arrival with considerable work of breathing and as such 2 DuoNeb's IV Solu-Medrol administered followed by BiPAP application.  Patient admits to improve stress status since being on BiPAP.  Laboratory data notable for CO2 on VBG of 120.  Patient discussed with Dr. Sheryle Hailiamond for hospital admission for further evaluation and management of acute hypoxic respiratory failure ____________________________________________  FINAL CLINICAL IMPRESSION(S) / ED DIAGNOSES  Final diagnoses:  Acute respiratory failure with hypercapnia (HCC)     MEDICATIONS GIVEN DURING THIS VISIT:  Medications  ipratropium-albuterol (DUONEB) 0.5-2.5 (3) MG/3ML nebulizer solution 3 mL (3 mLs Nebulization Given 11/01/17 0354)  ipratropium-albuterol (DUONEB) 0.5-2.5 (3) MG/3ML nebulizer solution 3 mL (3 mLs Nebulization Given 11/01/17 0354)  methylPREDNISolone sodium succinate (SOLU-MEDROL) 125 mg/2 mL injection 125 mg (125 mg Intravenous Given 11/01/17 0436)     ED Discharge Orders      None       Note:  This document was prepared using Dragon voice recognition software and may include unintentional dictation errors.    Darci CurrentBrown, [place] N, MD 11/01/17 [id number]    Darci CurrentBrown, Marianne N, MD 11/01/17 [id number]

## 2017-11-01 NOTE — H&P (Signed)
Rebecca Bird is an 57 y.o. female.   Chief Complaint: Shortness of breath HPI: The patient with past medical history of hypertension, COPD, sleep apnea and CHF presents to the emergency department with shortness of breath.  The patient was found to be less responsive than normal at her nursing home.  EMS reported that CPAP was malpositioned on her face and that her nasal cannula was tied in a not.  Oxygen saturations upon arrival to the emergency department was 60%.  She was started on BiPAP with 100% FiO2.  She was given multiple breathing treatments as well as Solu-Medrol prior to the emergency department staff calling the hospitalist service for admission.  Past Medical History:  Diagnosis Date  . Anemia   . CHF (congestive heart failure) ([place])   . COPD (chronic obstructive pulmonary disease) ([place])   . Depression   . Hypertension   . Sleep apnea   . Thyroid disease     History reviewed. No pertinent surgical history.  Unable to obtain history as patient does not verbally respond consistently due to illness  No family history on file.  Same as above Social History:  reports that she has quit smoking. She has never used smokeless tobacco. Her alcohol and drug histories are not on file.  Allergies: No Known Allergies  Prior to Admission medications   Medication Sig Start Date End Date Taking? Authorizing Provider  acetaminophen (TYLENOL) 500 MG tablet Take 500 mg by mouth every 8 (eight) hours as needed for mild pain.    Yes [provider]  albuterol (PROVENTIL HFA;VENTOLIN HFA) 108 (90 Base) MCG/ACT inhaler Inhale 2 puffs into the lungs every 4 (four) hours as needed for shortness of breath.    Yes [provider]  carvedilol (COREG) 3.125 MG tablet Take 3.125 mg by mouth 2 (two) times daily with a meal.   Yes [provider]  citalopram (CELEXA) 20 MG tablet Take 40 mg by mouth daily.   Yes [provider]  diclofenac sodium (VOLTAREN) 1 % GEL  Apply 4 g topically 4 (four) times daily.   Yes [provider]  ferrous sulfate 325 (65 FE) MG tablet Take 325 mg by mouth daily with breakfast.   Yes [provider]  furosemide (LASIX) 80 MG tablet Take 120 mg by mouth daily.   Yes [provider]  ipratropium-albuterol (DUONEB) 0.5-2.5 (3) MG/3ML SOLN Take 3 mLs by nebulization every 12 (twelve) hours as needed (shortness of breath).    Yes [provider]  levothyroxine (SYNTHROID, LEVOTHROID) 50 MCG tablet Take 50 mcg by mouth daily before breakfast.   Yes [provider]  potassium chloride (K-DUR) 10 MEQ tablet Take 10 mEq by mouth daily.    Yes [provider]  traMADol (ULTRAM) 50 MG tablet Take 50 mg by mouth every 6 (six) hours as needed for moderate pain.    Yes [provider]     Results for orders placed or performed during the hospital encounter of 11/01/17 (from the past 48 hour(s))  Blood gas, venous     Status: Abnormal   Collection Time: 11/01/17  4:02 AM  Result Value Ref Range   pH, Ven 7.27 7.250 - 7.430   pCO2, Ven 120 (HH) 44.0 - 60.0 mmHg    Comment: CRITICAL RESULT CALLED TO, READ BACK BY AND VERIFIED WITH:  BROWN R, MD AT 0437 ON [id number]    pO2, Ven 60.0 (H) 32.0 - 45.0 mmHg   Bicarbonate 56.4 (  H) 20.0 - 28.0 mmol/L   Acid-Base Excess 23.8 (H) 0.0 - 2.0 mmol/L   O2 Saturation 87.3 %   Patient temperature 37.0    Collection site VENOUS    Sample type VENOUS     Comment: Performed at [facility], [address] [place], [place] [address]  CBC     Status: Abnormal   Collection Time: 11/01/17  4:02 AM  Result Value Ref Range   WBC 5.9 3.6 - 11.0 K/uL   RBC 3.56 (L) 3.80 - 5.20 MIL/uL   Hemoglobin 11.6 (L) 12.0 - 16.0 g/dL   HCT 36.5 35.0 - 47.0 %   MCV 102.4 (H) 80.0 - 100.0 fL   MCH 32.7 26.0 - 34.0 pg   MCHC 32.0 32.0 - 36.0 g/dL   RDW 18.5 (H) 11.5 - 14.5 %   Platelets 176 150 - 440 K/uL    Comment: Performed at [facility], [address] [place], [place] [address]  Comprehensive metabolic panel     Status: Abnormal   Collection Time: 11/01/17  4:02 AM  Result Value Ref Range   Sodium 142 135 - 145 mmol/L   Potassium 5.3 (H) 3.5 - 5.1 mmol/L   Chloride 87 (L) 98 - 111 mmol/L   CO2 47 (H) 22 - 32 mmol/L   Glucose, Bld 120 (H) 70 - 99 mg/dL   BUN 30 (H) 6 - 20 mg/dL   Creatinine, Ser 1.05 (H) 0.44 - 1.00 mg/dL   Calcium 8.7 (L) 8.9 - 10.3 mg/dL   Total Protein 6.8 6.5 - 8.1 g/dL   Albumin 3.4 (L) 3.5 - 5.0 g/dL   AST 37 15 - 41 U/L   ALT 44 0 - 44 U/L   Alkaline Phosphatase 75 38 - 126 U/L   Total Bilirubin 1.2 0.3 - 1.2 mg/dL   GFR calc non Af Amer 58 (L) >60 mL/min   GFR calc Af Amer >60 >60 mL/min    Comment: (NOTE) The eGFR has been calculated using the CKD EPI equation. This calculation has not been validated in all clinical situations. eGFR's persistently <60 mL/min signify possible Chronic Kidney Disease.    Anion gap 8 5 - 15    Comment: Performed at [facility], [address] [place], [place] [address]  Troponin I     Status: None   Collection Time: 11/01/17  4:02 AM  Result Value Ref Range   Troponin I <0.03 <0.03 ng/mL    Comment: Performed at [facility], [address], [place],  [address]   Dg Chest Portable 1 View  Result Date: 11/01/2017 CLINICAL DATA:  Shortness of breath. EXAM: PORTABLE CHEST 1 VIEW COMPARISON:  None. FINDINGS: The heart is enlarged. Vascular congestion and possible central pulmonary edema. No evidence of focal airspace disease, large pleural effusion or pneumothorax. The right humeral head appears anterior to the glenoid but suboptimally assessed. Soft tissue attenuation from body habitus limits detailed assessment. IMPRESSION: 1. Cardiomegaly with vascular congestion and possible central pulmonary edema. 2. Right humeral head appears anteriorly positioned with respect to glenoid, however suboptimally assessed on this  portable view, and this may be positioning. Recommend correlation with physical exam. Dedicated shoulder radiographs could be considered based on clinical concern. Electronically Signed   By: Jeb Levering M.D.   On: 11/01/2017 05:07    Review of Systems  Unable to perform ROS: Critical illness    Blood pressure 119/76, pulse 82, temperature 98.4 F (36.9 C),  temperature source Oral, resp. rate (!) 21, height _0  (1.676 m), weight (!) 216.3 kg (476 lb 13.7 oz), SpO2 96 %. Physical Exam  Vitals reviewed. Constitutional: She is oriented to person, place, and time. She appears well-developed and well-nourished. No distress.  HENT:  Head: Normocephalic and atraumatic.  Mouth/Throat: Oropharynx is clear and moist.  Eyes: Pupils are equal, round, and reactive to light. Conjunctivae and EOM are normal.  Neck: Normal range of motion. Neck supple. No JVD present. No tracheal deviation present. No thyromegaly present.  Cardiovascular: Normal rate, regular rhythm and normal heart sounds. Exam reveals no gallop and no friction rub.  No murmur heard. Respiratory: Breath sounds normal. She is in respiratory distress.  GI: Soft. Bowel sounds are normal. She exhibits no distension. There is no tenderness.  Genitourinary:  Genitourinary Comments: Deferred  Musculoskeletal: Normal range of motion. She exhibits edema.  Lymphadenopathy:    She has no cervical adenopathy.  Neurological: She is alert and oriented to person, place, and time. No cranial nerve deficit. She exhibits normal muscle tone.  Skin: Skin is warm and dry. No rash noted. No erythema.  Psychiatric: She has a normal mood and affect. Her behavior is normal. Judgment and thought content normal.     Assessment/Plan This is a 57 year old female admitted for respiratory failure. 1.  Respiratory failure: Acute on chronic; with hypoxia and hypercapnia.  Patient needs noninvasive positive pressure ventilation for retained CO2.  Hypoxia has  resolved with supplemental O2.  Wean as tolerated. 2.  CHF: Chronic; diastolic.  Continue Lasix per home regimen 3.  Hypertension: Controlled; continue carvedilol 4.  Hypothyroidism: Check TSH; continue Synthroid 5.  DVT prophylaxis: Lovenox 6.  GI prophylaxis: None The patient is a full code.  Time spent on admission orders and critical care approximately 45 minutes area discussed with [facility] telemedicine  Harrie Foreman, MD 11/01/2017, 7:10 AM

## 2017-11-01 NOTE — Progress Notes (Signed)
Pt A&O on currently on [place] 4L per RRT, SPO2 9 for mealtime. Family at the bedside, assisting pt with eating. Pt denies SOB and pain at this time. Pt has remained on Bipap through the shift with exceptions to mealtime, tolerating well.

## 2017-11-01 NOTE — Progress Notes (Signed)
Lovenox ordered 40mg  daily adjusted to 40mg  q12hr based on protocol for weight > 40 BMI. Pharmacy will continue to monitor.  Waldron LabsKaren K Joslynne Klatt, RPh 11/01/2017  9:15 AM

## 2017-11-01 NOTE — ED Triage Notes (Addendum)
Pt arrives to ED via [facility] EMS reports they were called out for respiratory distress, noted that the pt's CPAP with malpositioned and the pt's O2 sats were in the 60's. EMS states they repositioned the pt's CPAP with noticeable O2 sat improvement, but facility insisted she come for evaluation d/t "not being at her baseline". Pt normally wears 3L O2 via [place]. Pt also with a dislocated shoulder that's she "scheduled to have repaired this morning". Pt is A&O, in NAD, but appears very drowsy. Dr Manson PasseyBrown at bedside upon pt's arrival to ED. Of note, pt's O2 tubing facility was found to be tied in a knot that completely restricted airflow.

## 2017-11-02 ENCOUNTER — Inpatient Hospital Stay: Payer: Medicare Other

## 2017-11-02 DIAGNOSIS — J9621 Acute and chronic respiratory failure with hypoxia: Principal | ICD-10-CM

## 2017-11-02 DIAGNOSIS — J9622 Acute and chronic respiratory failure with hypercapnia: Secondary | ICD-10-CM

## 2017-11-02 LAB — BASIC METABOLIC PANEL
Anion gap: 7 (ref 5–15)
BUN: 32 mg/dL — AB (ref 6–20)
CALCIUM: 8.7 mg/dL — AB (ref 8.9–10.3)
CO2: 49 mmol/L — ABNORMAL HIGH (ref 22–32)
CREATININE: 0.89 mg/dL (ref 0.44–1.00)
Chloride: 87 mmol/L — ABNORMAL LOW (ref 98–111)
Glucose, Bld: 169 mg/dL — ABNORMAL HIGH (ref 70–99)
Potassium: 4.8 mmol/L (ref 3.5–5.1)
SODIUM: 143 mmol/L (ref 135–145)

## 2017-11-02 LAB — CBC WITH DIFFERENTIAL/PLATELET
BASOS PCT: 0 %
Basophils Absolute: 0 10*3/uL (ref 0–0.1)
EOS ABS: 0 10*3/uL (ref 0–0.7)
EOS PCT: 0 %
HCT: 33.3 % — ABNORMAL LOW (ref 35.0–47.0)
HEMOGLOBIN: 10.9 g/dL — AB (ref 12.0–16.0)
Lymphocytes Relative: 7 %
Lymphs Abs: 0.4 10*3/uL — ABNORMAL LOW (ref 1.0–3.6)
MCH: 32.7 pg (ref 26.0–34.0)
MCHC: 32.6 g/dL (ref 32.0–36.0)
MCV: 100.3 fL — ABNORMAL HIGH (ref 80.0–100.0)
Monocytes Absolute: 0.3 10*3/uL (ref 0.2–0.9)
Monocytes Relative: 5 %
NEUTROS PCT: 88 %
Neutro Abs: 5 10*3/uL (ref 1.4–6.5)
Platelets: 179 10*3/uL (ref 150–440)
RBC: 3.32 MIL/uL — AB (ref 3.80–5.20)
RDW: 18.1 % — ABNORMAL HIGH (ref 11.5–14.5)
WBC: 5.7 10*3/uL (ref 3.6–11.0)

## 2017-11-02 MED ORDER — CEFTRIAXONE SODIUM 1 G IJ SOLR
1.0000 g | INTRAMUSCULAR | Status: DC
  Administered 2017-11-02 – 2017-11-04 (×3): 1 g via INTRAVENOUS
  Filled 2017-11-02 (×3): qty 1
  Filled 2017-11-02: qty 10

## 2017-11-02 MED ORDER — DIPHENHYDRAMINE HCL 25 MG PO CAPS
25.0000 mg | ORAL_CAPSULE | Freq: Once | ORAL | Status: AC
  Administered 2017-11-02: 25 mg via ORAL
  Filled 2017-11-02: qty 1

## 2017-11-02 MED ORDER — LEVOTHYROXINE SODIUM 100 MCG PO TABS
100.0000 ug | ORAL_TABLET | Freq: Every day | ORAL | Status: DC
  Administered 2017-11-03 – 2017-11-05 (×3): 100 ug via ORAL
  Filled 2017-11-02 (×3): qty 1

## 2017-11-02 MED ORDER — AZITHROMYCIN 250 MG PO TABS
500.0000 mg | ORAL_TABLET | Freq: Every day | ORAL | Status: DC
  Administered 2017-11-03 – 2017-11-05 (×3): 500 mg via ORAL
  Filled 2017-11-02: qty 1
  Filled 2017-11-02 (×3): qty 2

## 2017-11-02 NOTE — Progress Notes (Signed)
Follow up - Critical Care Medicine Note  Patient Details:    Rebecca Bird is an 57 y.o. female with morbid obesity, CHF and COPD who presents to the emergency department from [facility]. Patient was found with her CPAP malpositioned and shortness of breath with cyanosis. Upon arrival to the ED patient was noted to have O2 saturation in the 60s and was hypercapnic arterial blood gas admitted to the intensive care unit for BiPAP.   Lines, Airways, Drains: External Urinary Catheter (Active)  Collection Container Dedicated Suction Canister 11/02/2017  2:00 AM  Output (mL) 25 mL 11/02/2017  6:00 AM    Anti-infectives:  Anti-infectives (From admission, onward)   Start     Dose/Rate Route Frequency Ordered Stop   11/01/17 1000  levofloxacin (LEVAQUIN) IVPB 750 mg     750 mg 100 mL/hr over 90 Minutes Intravenous Every 24 hours 11/01/17 0945        Microbiology: Results for orders placed or performed during the hospital encounter of 11/01/17  MRSA PCR Screening     Status: None   Collection Time: 11/01/17  8:55 AM  Result Value Ref Range Status   MRSA by PCR NEGATIVE NEGATIVE Final    Comment:        The GeneXpert MRSA Assay (FDA approved for NASAL specimens only), is one component of a comprehensive MRSA colonization surveillance program. It is not intended to diagnose MRSA infection nor to guide or monitor treatment for MRSA infections. Performed at [facility], [address] [place] [place], [place] [address]     Studies: Dg Chest [facility] 1 View  Result Date: 11/02/2017 CLINICAL DATA:  Acute respiratory failure EXAM: PORTABLE CHEST 1 VIEW COMPARISON:  11/01/2017 FINDINGS: Cardiomegaly with vascular congestion. Interstitial prominence throughout the lungs, likely mild interstitial edema, similar to prior study. No visible effusions or acute bony abnormality. IMPRESSION: Mild edema/CHF.  No change. Electronically Signed   By: Charlett Nose M.D.   On: 11/02/2017 07:21    Dg Chest Portable 1 View  Result Date: 11/01/2017 CLINICAL DATA:  Shortness of breath. EXAM: PORTABLE CHEST 1 VIEW COMPARISON:  None. FINDINGS: The heart is enlarged. Vascular congestion and possible central pulmonary edema. No evidence of focal airspace disease, large pleural effusion or pneumothorax. The right humeral head appears anterior to the glenoid but suboptimally assessed. Soft tissue attenuation from body habitus limits detailed assessment. IMPRESSION: 1. Cardiomegaly with vascular congestion and possible central pulmonary edema. 2. Right humeral head appears anteriorly positioned with respect to glenoid, however suboptimally assessed on this portable view, and this may be positioning. Recommend correlation with physical exam. Dedicated shoulder radiographs could be considered based on clinical concern. Electronically Signed   By: Rubye Oaks M.D.   On: 11/01/2017 05:07    Consults:    Subjective:    Overnight Issues: patient was transitioned on and off of BiPAP throughout the day. Presently awake and alert on BiPAP.Does complain of shoulder pain. Shoulder sling was ordered based on previous consultation  Objective:  Vital signs for last 24 hours: Temp:  [98.1 F (36.7 C)-99 F (37.2 C)] 99 F (37.2 C) (07/31 0200) Pulse Rate:  [71-94] 76 (07/31 0700) Resp:  [13-28] 27 (07/31 0700) BP: (98-143)/(36-106) 109/54 (07/31 0700) SpO2:  [86 %-100 %] 93 % (07/31 0700) FiO2 (%):  [50 %-60 %] 50 % (07/30 1200) Weight:  [439 lb 6 oz (199.3 kg)-468 lb 7.6 oz (212.5 kg)] 468 lb 7.6 oz (212.5 kg) (07/31 0423)  Hemodynamic parameters for  last 24 hours:    Intake/Output from previous day: 07/30 0701 - 07/31 0700 In: 240 [P.O.:240] Out: 2025 [Urine:2025]  Intake/Output this shift: No intake/output data recorded.  Vent settings for last 24 hours: FiO2 (%):  [50 %-60 %] 50 %  Physical Exam:   Constitutional: She is oriented to person, place, and time.  Morbidly obese female,  on BIPAP in hospital bed  HENT:  Head: Normocephalic and atraumatic.  Eyes: Pupils are equal, round, and reactive to light. EOM are normal.  Cardiovascular:  S1 and S2. Regular rate and rhythm. No murmurs, rubs or gallops. 2+ dorsalis pedis and radial pulses bilaterally  Pulmonary/Chest: patient is resting comfortably on BiPAP Decreased air movement, bilateral rhonchi. No rales  Abdominal:  Obese, soft abdomen. Non-distended, no tenderness to palpation. Positive bowel sounds.   Musculoskeletal:       Right lower leg: She exhibits edema (1+ pedal).       Left lower leg: She exhibits edema (1+ pedal).  Lymphadenopathy:    She has no cervical adenopathy.  Neurological: She is alert and oriented to person,  Assessment/Plan:    57 year old female with morbid obesity, CHF, COPD (4L O2 chronic via nasal cannula), and OHS who presents to the emergency department for evaluation of shortness of breath from [facility]. On exam, patient is on BiPAP and is alert and oriented. Will continue diuresis, Levaquin, Solu-Medrol, albuterol, Atrovent, budesonide and wean BiPAP as tolerated  Metabolic alkalosis. Compensatory secondary to chronic respiratory acidosis  Anemia. No evidence of active bleeding  Prerenal azotemia  Hypothyroidism with a TSH of 12.55 will start on replacement   Critical Care Total Time*: 30 Minutes  Rebecca Bird 11/02/2017  *Care during the described time interval was provided by me and/or other providers on the critical care team.  I have reviewed this patient's available data, including medical history, events of note, physical examination and test results as part of my evaluation.

## 2017-11-02 NOTE — Progress Notes (Signed)
Patient called out to the nursing station with complaints of itching and red face. Patient's face is more red and itching present in arm. Stopped infusion and flushed line. Informed Pharmacy and Misty StanleyLisa stated she would inform Dr. Lonn Georgiaonforti and make changes. Patient is alert, oriented x4 and wearing oxygen at 4 L/min per home dose. Patient has family at bedside and already the itching has reduced with infusion stopped. Will continue to monitor.

## 2017-11-02 NOTE — Progress Notes (Signed)
[facility] [place] [facility]   PATIENT NAME: Rebecca Bird    MR#:  [id number]  DATE OF BIRTH:  06/27/1960  SUBJECTIVE:  CHIEF COMPLAINT:   Chief Complaint  Patient presents with  . Shortness of Breath   -came in with dyspnea, hypoxia.  Also unable to herself at home -Developed reaction to Levaquin this morning with pruritus and facial flushing/rash  REVIEW OF SYSTEMS:  Review of Systems  Constitutional: Positive for malaise/fatigue. Negative for chills and fever.  HENT: Negative for congestion, ear discharge, hearing loss and nosebleeds.   Eyes: Negative for blurred vision and double vision.  Respiratory: Positive for cough, shortness of breath and wheezing.   Cardiovascular: Negative for chest pain and palpitations.  Gastrointestinal: Negative for abdominal pain, constipation, diarrhea, nausea and vomiting.  Genitourinary: Negative for dysuria.  Musculoskeletal: Positive for myalgias.  Neurological: Positive for weakness. Negative for dizziness, speech change, focal weakness, seizures and headaches.  Psychiatric/Behavioral: Negative for depression.    DRUG ALLERGIES:   Allergies  Allergen Reactions  . Levaquin [Levofloxacin In D5w] Itching and Rash    VITALS:  Blood pressure 122/76, pulse 82, temperature 99 F (37.2 C), temperature source Oral, resp. rate 19, height 5\' 7"  (1.702 m), weight (!) 212.5 kg (468 lb 7.6 oz), SpO2 93 %.  PHYSICAL EXAMINATION:  Physical Exam   GENERAL:  57 y.o.-year-old morbidly obese patient lying in the bed with no acute distress.  EYES: Pupils equal, round, reactive to light and accommodation. No scleral icterus. Extraocular muscles intact.  HEENT: Head atraumatic, normocephalic. Oropharynx and nasopharynx clear.  NECK:  Supple, no jugular venous distention. No thyroid enlargement, no tenderness.  LUNGS: scant breath sounds bilaterally, minimal scattered wheezing,no  rales,rhonchi or crepitation. No use of  accessory muscles of respiration.  CARDIOVASCULAR: S1, S2 normal. No murmurs, rubs, or gallops.  ABDOMEN: Soft, obese, nontender, nondistended. Bowel sounds present. No organomegaly or mass.  EXTREMITIES: No pedal edema, cyanosis, or clubbing.  NEUROLOGIC: Cranial nerves II through XII are intact. Muscle strength 5/5 in all extremities. Sensation intact. Gait not checked. Global weakness noted. PSYCHIATRIC: The patient is alert and oriented x 3.  SKIN: No obvious rash, lesion, or ulcer.    LABORATORY PANEL:   CBC Recent Labs  Lab 11/02/17 0442  WBC 5.7  HGB 10.9*  HCT 33.3*  PLT 179   ------------------------------------------------------------------------------------------------------------------  Chemistries  Recent Labs  Lab 11/01/17 0402 11/02/17 0442  NA 142 143  K 5.3* 4.8  CL 87* 87*  CO2 47* 49*  GLUCOSE 120* 169*  BUN 30* 32*  CREATININE 1.05* 0.89  CALCIUM 8.7* 8.7*  AST 37  --   ALT 44  --   ALKPHOS 75  --   BILITOT 1.2  --    ------------------------------------------------------------------------------------------------------------------  Cardiac Enzymes Recent Labs  Lab 11/01/17 0402  TROPONINI <0.03   ------------------------------------------------------------------------------------------------------------------  RADIOLOGY:  Dg Chest Port 1 View  Result Date: 11/02/2017 CLINICAL DATA:  Acute respiratory failure EXAM: PORTABLE CHEST 1 VIEW COMPARISON:  11/01/2017 FINDINGS: Cardiomegaly with vascular congestion. Interstitial prominence throughout the lungs, likely mild interstitial edema, similar to prior study. No visible effusions or acute bony abnormality. IMPRESSION: Mild edema/CHF.  No change. Electronically Signed   By: Charlett NoseKevin  Dover M.D.   On: 11/02/2017 07:21   Dg Chest Portable 1 View  Result Date: 11/01/2017 CLINICAL DATA:  Shortness of breath. EXAM: PORTABLE CHEST 1 VIEW COMPARISON:  None. FINDINGS: The heart is enlarged. Vascular  congestion and possible central  pulmonary edema. No evidence of focal airspace disease, large pleural effusion or pneumothorax. The right humeral head appears anterior to the glenoid but suboptimally assessed. Soft tissue attenuation from body habitus limits detailed assessment. IMPRESSION: 1. Cardiomegaly with vascular congestion and possible central pulmonary edema. 2. Right humeral head appears anteriorly positioned with respect to glenoid, however suboptimally assessed on this portable view, and this may be positioning. Recommend correlation with physical exam. Dedicated shoulder radiographs could be considered based on clinical concern. Electronically Signed   By: Rubye Oaks M.D.   On: 11/01/2017 05:07    EKG:   Orders placed or performed during the hospital encounter of 11/01/17  . EKG 12-Lead  . EKG 12-Lead  . ED EKG  . ED EKG    ASSESSMENT AND PLAN:   57 year old female with morbid obesity, diastolic CHF, COPD on 4 L home oxygen and sleep apnea presents to hospital secondary to shortness of breath and hypoxia  1.  Acute on chronic respiratory failure-secondary to COPD exacerbation and obesity hypoventilation -Started on BiPAP in the ICU.  Now weaned to her 4 L chronic home oxygen -Continue BiPAP at bedtime while in the hospital -On IV steroids, daily oral Lasix. -Developed allergic reaction to Levaquin.  Started on Rocephin and azithromycin -Appreciate pulmonary consult.  2.  Chronic diastolic CHF-enlarged heart and mild pulmonary vascular congestion.  On high-dose Lasix.  PRN IV Lasix if needed  3.  Anemia of chronic disease-stable.  No indication for transfusion  4.  Weakness-physical therapy consulted.  Patient and family very interested in placement.  Patient repeatedly saying that she is unable to care for herself at home. -Social worker consulted.  5.  DVT prophylaxis-Lovenox  PT consult is pending Can be transferred to floor  Updated aunt over the phone per  patients' request.   All the records are reviewed and case discussed with Care Management/Social Workerr. Management plans discussed with the patient, family and they are in agreement.  CODE STATUS: Full Code  TOTAL TIME TAKING CARE OF THIS PATIENT: 39 minutes.   POSSIBLE D/C IN 2-3 DAYS, DEPENDING ON CLINICAL CONDITION.   Enid Baas M.D on 11/02/2017 at 3:19 PM  Between 7am to 6pm - Pager - [phone]  After 6pm go to [email or web address] - Social research officer, government  [facility]  Office  [phone]  CC: Primary care physician; Abner Greenspan, MD

## 2017-11-02 NOTE — Progress Notes (Signed)
Pt remained on and off BiPAP overnight and on no gtts.

## 2017-11-02 NOTE — Clinical Social Work Note (Signed)
Clinical Social Work Assessment  Patient Details  Name: Rebecca Bird MRN: [id number] Date of Birth: 1960-05-16  Date of referral:  11/02/17               Reason for consult:  Family Concerns                Permission sought to share information with:    Permission granted to share information::     Name::        Agency::     Relationship::     Contact Information:     Housing/Transportation Living arrangements for the past 2 months:  Skilled Nursing Facility Source of Information:  Adult Children Patient Interpreter Needed:  None Criminal Activity/Legal Involvement Pertinent to Current Situation/Hospitalization:  No - Comment as needed Significant Relationships:  Adult Children Lives with:  Facility Resident Do you feel safe going back to the place where you live?  Yes Need for family participation in patient care:  Yes (Comment)  Care giving concerns:  Patient resides at [facility]. She has been there for 2 weeks where she was placed from [facility].   Social Worker assessment / plan:  CSW received call from daughter: Ms. Rebecca Bird: [phone]. Rebecca Bird was wanting to know if patient could return to [facility]. She wanted to complete a [employer] Power of Attorney. CSW was able to confirm that patient can return to [facility]. Patient's daughter aware that when she returns to hospital she can ask the nurse to page pastoral care to complete the healthcare power of attorney.  Employment status:  Disabled (Comment on whether or not currently receiving Disability) Insurance information:    PT Recommendations:    Information / Referral to community resources:     Patient/Family's Response to care:  Patient's daughter appreciated CSW assistance.  Patient/Family's Understanding of and Emotional Response to Diagnosis, Current Treatment, and Prognosis:  Patient's daughter very involved with her mother's current treatment and aware  of patient's barriers when it comes to placement. She said she was very happy that [facility] can take patient back.  Emotional Assessment Appearance:  Appears stated age Attitude/Demeanor/Rapport:    Affect (typically observed):    Orientation:    Alcohol / Substance use:  Not Applicable Psych involvement (Current and /or in the community):  No (Comment)  Discharge Needs  Concerns to be addressed:  Care Coordination Readmission within the last 30 days:  No Current discharge risk:  None Barriers to Discharge:  No Barriers Identified   Rebecca SpanielMonica Sari Cogan, LCSW 11/02/2017, 5:11 PM

## 2017-11-03 LAB — BASIC METABOLIC PANEL WITH GFR
BUN: 37 mg/dL — ABNORMAL HIGH (ref 6–20)
CO2: >50 mmol/L — ABNORMAL HIGH (ref 22–32)
Calcium: 8.9 mg/dL (ref 8.9–10.3)
Chloride: 86 mmol/L — ABNORMAL LOW (ref 98–111)
Creatinine, Ser: 0.97 mg/dL (ref 0.44–1.00)
GFR calc Af Amer: >60 mL/min
GFR calc non Af Amer: >60 mL/min
Glucose, Bld: 142 mg/dL — ABNORMAL HIGH (ref 70–99)
Potassium: 4.4 mmol/L (ref 3.5–5.1)
Sodium: 145 mmol/L (ref 135–145)

## 2017-11-03 LAB — CBC
HCT: 33.6 % — ABNORMAL LOW (ref 35.0–47.0)
Hemoglobin: 11 g/dL — ABNORMAL LOW (ref 12.0–16.0)
MCH: 32.8 pg (ref 26.0–34.0)
MCHC: 32.6 g/dL (ref 32.0–36.0)
MCV: 100.5 fL — ABNORMAL HIGH (ref 80.0–100.0)
Platelets: 180 10*3/uL (ref 150–440)
RBC: 3.35 MIL/uL — ABNORMAL LOW (ref 3.80–5.20)
RDW: 17.8 % — ABNORMAL HIGH (ref 11.5–14.5)
WBC: 6.1 10*3/uL (ref 3.6–11.0)

## 2017-11-03 NOTE — Evaluation (Signed)
Physical Therapy Evaluation Patient Details Name: Rebecca Bird MRN: [id number] DOB: [birth year] Today's Date: 11/03/2017   History of Present Illness  Pt presents to hospital on 11/01/17 after a fall and subsequently diagnosed with acute on chronic respiratory failure with hypoxia. Pt has a past medical history that includes anemia, CHF, COPD, depression, and HTN. Pts R shoulder is currently dislocated and requires a sling for all mobility. Plan is for MRI once able to coordinate in MRI machine big enough for pts body habitus.    Clinical Impression  Pt is a pleasant 57 year old female who was admitted for acute on chronic respiratory failure with hypoxia. Pt performs limited bed mobility with mod assist however is total assist for transfers. Pt demonstrates deficits with strength, mobility and endurance. Attempted to go supine<>sit however with max assist pt unable to perform. Pt instructed in LE there ex which she tolerates well however desaturates to 91%. Pt on 4L throughout session and at rest remains in the high 90s. R shoulder monitored and carefully positioned throughout and at conclusion of session. Pt is currently off of her baseline stating that she normally is independent for bed mobility and stand pivot transfers. Pt could benefit from continued skilled therapy at this time to improve deficits toward PLOF. PT will continue to work with pt at least 2x/week while admitted. D/c recommendations at this time are SNF likely followed by a long term care facility.      Follow Up Recommendations SNF    Equipment Recommendations  None recommended by PT    Recommendations for Other Services       Precautions / Restrictions Precautions Precautions: None Restrictions Weight Bearing Restrictions: No      Mobility  Bed Mobility Overal bed mobility: Needs Assistance Bed Mobility: Rolling;Supine to Sit(scooting) Rolling: Mod assist   Supine to sit: Total assist     General bed  mobility comments: Pt is mod assist to roll to L, deferred rolling to R due to shld dislocation, also mod assist for scooting and positioning in bed. Attempted to perform supine to sit however with max PT assist pt unable to perform.  Transfers                 General transfer comment: Unable to assess due to pts inability to perform supine<>sit  Ambulation/Gait             General Gait Details: Unable to assess due to pts inability to perform supine<>sit  Stairs            Wheelchair Mobility    Modified Rankin (Stroke Patients Only)       Balance                                             Pertinent Vitals/Pain Pain Assessment: No/denies pain    Home Living Family/patient expects to be discharged to:: Private residence Living Arrangements: Alone Available Help at Discharge: Family(intermittent by aunt and daughter) Type of Home: House Home Access: Ramped entrance     Home Layout: One level Home Equipment: Environmental consultantWalker - 4 wheels;Bedside commode      Prior Function Level of Independence: Needs assistance   Gait / Transfers Assistance Needed: Pt states that she rolls on rollator for all mobility, states that she is able to transfer to walker and BSC independently  ADL's / Homemaking Assistance  Needed: dependent on aunt and daughter  Comments: Pt reports multiple falls in recent months     Hand Dominance        Extremity/Trunk Assessment   Upper Extremity Assessment Upper Extremity Assessment: RUE deficits/detail;LUE deficits/detail RUE Deficits / Details: Not fully assessed to to current dislocation, able to squeeze hand 3/5 RUE Sensation: WNL LUE Deficits / Details: Grossly at least 4-/5 including grip strength, elbow flex/ext and shld flex    Lower Extremity Assessment Lower Extremity Assessment: Generalized weakness(Grossly 3+/5 hip flex, knee flex/ext and DF, equal bilat)       Communication   Communication: No  difficulties  Cognition Arousal/Alertness: Awake/alert Behavior During Therapy: WFL for tasks assessed/performed Overall Cognitive Status: Within Functional Limits for tasks assessed                                        General Comments      Exercises Other Exercises Other Exercises: pt instructed in B LE ther ex including SLR, ankle pumps, quad sets, glut sets, and heel slides x10. Pt fatigues during and O2 saturation drops to 91.   Assessment/Plan    PT Assessment Patient needs continued PT services  PT Problem List Decreased strength;Decreased range of motion;Decreased activity tolerance;Decreased balance;Decreased mobility;Decreased coordination;Decreased knowledge of use of DME;Decreased safety awareness;Cardiopulmonary status limiting activity;Obesity       PT Treatment Interventions DME instruction;Gait training;Stair training;Functional mobility training;Therapeutic activities;Therapeutic exercise;Balance training;Neuromuscular re-education;Patient/family education    PT Goals (Current goals can be found in the Care Plan section)  Acute Rehab PT Goals Patient Stated Goal: pt states that she would like to be able to sit up PT Goal Formulation: With patient Time For Goal Achievement: 11/17/17 Potential to Achieve Goals: Fair    Frequency Min 2X/week   Barriers to discharge        Co-evaluation               AM-PAC PT "6 Clicks" Daily Activity  Outcome Measure Difficulty turning over in bed (including adjusting bedclothes, sheets and blankets)?: Unable Difficulty moving from lying on back to sitting on the side of the bed? : Unable Difficulty sitting down on and standing up from a chair with arms (e.g., wheelchair, bedside commode, etc,.)?: Unable Help needed moving to and from a bed to chair (including a wheelchair)?: Total Help needed walking in hospital room?: Total Help needed climbing 3-5 steps with a railing? : Total 6 Click Score:  6    End of Session   Activity Tolerance: Other (comment)(limited due to gross deconditioning) Patient left: in bed;with call bell/phone within reach;with bed alarm set;with family/visitor present Nurse Communication: Mobility status;Other (comment)(oxygen) PT Visit Diagnosis: Unsteadiness on feet (R26.81);Other abnormalities of gait and mobility (R26.89);Repeated falls (R29.6);Muscle weakness (generalized) (M62.81);History of falling (Z91.81);Difficulty in walking, not elsewhere classified (R26.2)    Time: 3086-5784 PT Time Calculation (min) (ACUTE ONLY): 26 min   Charges:   PT Evaluation $PT Eval Low Complexity: 1 Low PT Treatments $Therapeutic Exercise: 8-22 mins        [employer], SPT   Sulayman Manning 11/03/2017, 3:14 PM

## 2017-11-03 NOTE — Progress Notes (Signed)
[employer] - [place] at [facility]   PATIENT NAME: Rebecca Bird    MR#:  [id number]  DATE OF BIRTH:  1960/08/25  SUBJECTIVE:  CHIEF COMPLAINT:   Chief Complaint  Patient presents with  . Shortness of Breath   -Patient and her aunt at bedside revealed that patient cannot go back home.  She is from skilled nursing facility at this time.  They want long-term placement -Breathing is about the same  REVIEW OF SYSTEMS:  Review of Systems  Constitutional: Positive for malaise/fatigue. Negative for chills and fever.  HENT: Negative for congestion, ear discharge, hearing loss and nosebleeds.   Eyes: Negative for blurred vision and double vision.  Respiratory: Positive for cough, shortness of breath and wheezing.   Cardiovascular: Negative for chest pain and palpitations.  Gastrointestinal: Negative for abdominal pain, constipation, diarrhea, nausea and vomiting.  Genitourinary: Negative for dysuria.  Musculoskeletal: Positive for myalgias.  Neurological: Positive for weakness. Negative for dizziness, speech change, focal weakness, seizures and headaches.  Psychiatric/Behavioral: Negative for depression.    DRUG ALLERGIES:   Allergies  Allergen Reactions  . Levaquin [Levofloxacin In D5w] Itching and Rash    VITALS:  Blood pressure 120/75, pulse 75, temperature 98.1 F (36.7 C), temperature source Oral, resp. rate 17, height 5\' 7"  (1.702 m), weight (!) 210.5 kg (464 lb), SpO2 90 %.  PHYSICAL EXAMINATION:  Physical Exam   GENERAL:  57 y.o.-year-old morbidly obese patient lying in the bed with no acute distress.  EYES: Pupils equal, round, reactive to light and accommodation. No scleral icterus. Extraocular muscles intact.  HEENT: Head atraumatic, normocephalic. Oropharynx and nasopharynx clear.  NECK:  Supple, no jugular venous distention. No thyroid enlargement, no tenderness.  LUNGS: scant breath sounds bilaterally, minimal scattered wheezing,no   rales,rhonchi or crepitation. No use of accessory muscles of respiration.  CARDIOVASCULAR: S1, S2 normal. No murmurs, rubs, or gallops.  ABDOMEN: Soft, obese, nontender, nondistended. Bowel sounds present. No organomegaly or mass.  EXTREMITIES: No pedal edema, cyanosis, or clubbing.  NEUROLOGIC: Cranial nerves II through XII are intact. Muscle strength 5/5 in all extremities. Sensation intact. Gait not checked. Global weakness noted. PSYCHIATRIC: The patient is alert and oriented x 3.  SKIN: No obvious rash, lesion, or ulcer.    LABORATORY PANEL:   CBC Recent Labs  Lab 11/03/17 0407  WBC 6.1  HGB 11.0*  HCT 33.6*  PLT 180   ------------------------------------------------------------------------------------------------------------------  Chemistries  Recent Labs  Lab 11/01/17 0402  11/03/17 0407  NA 142   < > 145  K 5.3*   < > 4.4  CL 87*   < > 86*  CO2 47*   < > >50*  GLUCOSE 120*   < > 142*  BUN 30*   < > 37*  CREATININE 1.05*   < > 0.97  CALCIUM 8.7*   < > 8.9  AST 37  --   --   ALT 44  --   --   ALKPHOS 75  --   --   BILITOT 1.2  --   --    < > = values in this interval not displayed.   ------------------------------------------------------------------------------------------------------------------  Cardiac Enzymes Recent Labs  Lab 11/01/17 0402  TROPONINI <0.03   ------------------------------------------------------------------------------------------------------------------  RADIOLOGY:  Dg Chest Port 1 View  Result Date: 11/02/2017 CLINICAL DATA:  Acute respiratory failure EXAM: PORTABLE CHEST 1 VIEW COMPARISON:  11/01/2017 FINDINGS: Cardiomegaly with vascular congestion. Interstitial prominence throughout the lungs, likely mild interstitial edema, similar to prior  study. No visible effusions or acute bony abnormality. IMPRESSION: Mild edema/CHF.  No change. Electronically Signed   By: Charlett Nose M.D.   On: 11/02/2017 07:21    EKG:   Orders placed  or performed during the hospital encounter of 11/01/17  . EKG 12-Lead  . EKG 12-Lead  . ED EKG  . ED EKG    ASSESSMENT AND PLAN:   57 year old female with morbid obesity, diastolic CHF, COPD on 4 L home oxygen and sleep apnea presents to hospital secondary to shortness of breath and hypoxia  1.  Acute on chronic respiratory failure-secondary to COPD exacerbation and obesity hypoventilation -Started on BiPAP in the ICU.  Now weaned to her 4 L chronic home oxygen -Continue BiPAP/CPAP at bedtime while in the hospital -On IV steroids, daily oral Lasix. -Developed allergic reaction to Levaquin.  Now on Rocephin and azithromycin -Appreciate pulmonary consult.  2.  Chronic diastolic CHF-enlarged heart and mild pulmonary vascular congestion.  On high-dose Lasix.  PRN IV Lasix if needed  3.  Anemia of chronic disease-stable.  No indication for transfusion  4.  Weakness-physical therapy consulted.  Patient and family very interested in placement.  Patient repeatedly saying that she is unable to care for herself at home. -Patient is currently at [facility] health care.  Family interested in long-term placement -Social worker consulted.  5.  DVT prophylaxis-Lovenox  6.  Right shoulder dislocation-seen at [facility], failed attempts to relocate the displacement.  Recommended outpatient follow-up for possible reconstruction surgery. -Discussed with on-call orthopedics.-Deferred to outpatient follow-up  PT consult is pending   Updated Aunt and also case discussed with Child psychotherapist.   All the records are reviewed and case discussed with Care Management/Social Workerr. Management plans discussed with the patient, family and they are in agreement.  CODE STATUS: Full Code  TOTAL TIME TAKING CARE OF THIS PATIENT: 41 minutes.   POSSIBLE D/C IN 2-3 DAYS, DEPENDING ON CLINICAL CONDITION.   Enid Baas M.D on 11/03/2017 at 2:28 PM  Between 7am to 6pm - Pager -  [phone]  After 6pm go to [email or web address] - Social research officer, government  [facility] [place] [facility]  Office  [phone]  CC: Primary care physician; Abner Greenspan, MD

## 2017-11-03 NOTE — Clinical Social Work Note (Signed)
CSW spoke with patient's daughter Burna MortimerWanda via phone [phone], who stated the plan is for patient to discharge to [facility] care once she is medically ready for discharge.  CSW to complete formal assessment, patient's family is inn.  Patient's niece Tyson AliasJudy Roberts [phone] she was at bedside.  Formal assessment to follow, plan is for patient to go under rehab benefits and then transfer to long ter care.  Ervin KnackEric R. Trenae Brunke, MSW, Theresia MajorsLCSWA [phone]  11/03/2017 5:54 PM

## 2017-11-04 ENCOUNTER — Other Ambulatory Visit: Payer: Self-pay

## 2017-11-04 NOTE — Plan of Care (Signed)
  Problem: Education: Goal: Knowledge of General Education information will improve Description Including pain rating scale, medication(s)/side effects and non-pharmacologic comfort measures Outcome: Progressing   Problem: Spiritual Needs Goal: Ability to function at adequate level Outcome: Progressing   

## 2017-11-04 NOTE — Care Management Important Message (Addendum)
Spoke with patient in room and later called daughter, Duwayne HeckDanielle, at patient's request to make aware of Medicare IM right.   Daughter requested to sign copy of IM in person while visiting mother on Saturday.  Copy to be left in room for her to sign around 2:30pm.  Instructed to bring to the nurse to have it placed in her chart so it can be scanned in at later time.

## 2017-11-04 NOTE — Progress Notes (Signed)
[employer] [facility] -  at [facility]   PATIENT NAME: Rebecca Bird    MR#:  [id number]  DATE OF BIRTH:  12/07/1960  SUBJECTIVE:  CHIEF COMPLAINT:   Chief Complaint  Patient presents with  . Shortness of Breath   - no changes, feels about the same - using Bipap at bedtime  REVIEW OF SYSTEMS:  Review of Systems  Constitutional: Positive for malaise/fatigue. Negative for chills and fever.  HENT: Negative for congestion, ear discharge, hearing loss and nosebleeds.   Eyes: Negative for blurred vision and double vision.  Respiratory: Positive for cough, shortness of breath and wheezing.   Cardiovascular: Negative for chest pain and palpitations.  Gastrointestinal: Negative for abdominal pain, constipation, diarrhea, nausea and vomiting.  Genitourinary: Negative for dysuria.  Musculoskeletal: Positive for myalgias.  Neurological: Positive for weakness. Negative for dizziness, speech change, focal weakness, seizures and headaches.  Psychiatric/Behavioral: Negative for depression.    DRUG ALLERGIES:   Allergies  Allergen Reactions  . Levaquin [Levofloxacin In D5w] Itching and Rash    VITALS:  Blood pressure 125/75, pulse 80, temperature (!) 97.5 F (36.4 C), temperature source Oral, resp. rate 14, height 5\' 7"  (1.702 m), weight (!) 204.6 kg (451 lb), SpO2 93 %.  PHYSICAL EXAMINATION:  Physical Exam   GENERAL:  57 y.o.-year-old morbidly obese patient lying in the bed with no acute distress.  EYES: Pupils equal, round, reactive to light and accommodation. No scleral icterus. Extraocular muscles intact.  HEENT: Head atraumatic, normocephalic. Oropharynx and nasopharynx clear.  NECK:  Supple, no jugular venous distention. No thyroid enlargement, no tenderness.  LUNGS: scant breath sounds bilaterally, minimal scattered wheezing,no  rales,rhonchi or crepitation. No use of accessory muscles of respiration.  CARDIOVASCULAR: S1, S2 normal. No murmurs, rubs, or  gallops.  ABDOMEN: Soft, obese, nontender, nondistended. Bowel sounds present. No organomegaly or mass.  EXTREMITIES: No pedal edema, cyanosis, or clubbing.  NEUROLOGIC: Cranial nerves II through XII are intact. Muscle strength 5/5 in all extremities. Sensation intact. Gait not checked. Global weakness noted. PSYCHIATRIC: The patient is alert and oriented x 3.  SKIN: No obvious rash, lesion, or ulcer.    LABORATORY PANEL:   CBC Recent Labs  Lab 11/03/17 0407  WBC 6.1  HGB 11.0*  HCT 33.6*  PLT 180   ------------------------------------------------------------------------------------------------------------------  Chemistries  Recent Labs  Lab 11/01/17 0402  11/03/17 0407  NA 142   < > 145  K 5.3*   < > 4.4  CL 87*   < > 86*  CO2 47*   < > >50*  GLUCOSE 120*   < > 142*  BUN 30*   < > 37*  CREATININE 1.05*   < > 0.97  CALCIUM 8.7*   < > 8.9  AST 37  --   --   ALT 44  --   --   ALKPHOS 75  --   --   BILITOT 1.2  --   --    < > = values in this interval not displayed.   ------------------------------------------------------------------------------------------------------------------  Cardiac Enzymes Recent Labs  Lab 11/01/17 0402  TROPONINI <0.03   ------------------------------------------------------------------------------------------------------------------  RADIOLOGY:  No results found.  EKG:   Orders placed or performed during the hospital encounter of 11/01/17  . EKG 12-Lead  . EKG 12-Lead  . ED EKG  . ED EKG    ASSESSMENT AND PLAN:   57 year old female with morbid obesity, diastolic CHF, COPD on 4 L home oxygen and sleep apnea presents to hospital  secondary to shortness of breath and hypoxia  1.  Acute on chronic respiratory failure-secondary to COPD exacerbation and obesity hypoventilation -was on BiPAP in the ICU.  Now weaned to her 4 L chronic home oxygen during daytime -Continue BiPAP/CPAP at bedtime while in the hospital -On IV steroids,  daily oral Lasix. -Developed allergic reaction to Levaquin.  Now on Rocephin and azithromycin -Appreciate pulmonary consult.  2.  Chronic diastolic CHF-enlarged heart and mild pulmonary vascular congestion.  On high-dose Lasix.  PRN IV Lasix if needed  3.  Anemia of chronic disease-stable.  No indication for transfusion  4.  Weakness-physical therapy consulted.  Patient and family very interested in placement.  Patient repeatedly saying that she is unable to care for herself at home. -Patient is currently at [facility].  Family interested in long-term placement -Social worker consulted.  5.  DVT prophylaxis-Lovenox  6.  Right shoulder dislocation-seen at [facility], failed attempts to relocate the displacement.  Recommended outpatient follow-up for possible reconstruction surgery. -Discussed with on-call orthopedics.- recommended outpatient follow-up  PT consult is recommended SNF Patient from [facility]- family interested in long term care.    .   All the records are reviewed and case discussed with Care Management/Social Workerr. Management plans discussed with the patient, family and they are in agreement.  CODE STATUS: Full Code  TOTAL TIME TAKING CARE OF THIS PATIENT: 32 minutes.   POSSIBLE D/C IN 1-2 DAYS, DEPENDING ON CLINICAL CONDITION.   Nicollette Wilhelmi M.D on 11/04/2017 at 1:42 PM  Between 7am to 6pm - Pager - [phone]  After 6pm go to [email or web address] - Social research officer, governmentpassword EPAS [facility]  [facility] [address] [facility]  Office  [phone]  CC: Primary care physician; Abner GreenspanHodges, Beth, MD

## 2017-11-04 NOTE — Consult Note (Signed)
Discussed case with Dr. Nemiah CommanderKalisetti yesterday.  She has a chronically dislocated right shoulder.  Close attempts at reducing the shoulder 2 weeks ago at an outside hospital were unsuccessful.  Patient is due to follow-up with Dr. Prentiss BellsWaterman a shoulder specialist at [facility] for further evaluation and management.  Patient's orthopedic consult here at [facility] is canceled.

## 2017-11-04 NOTE — Progress Notes (Signed)
Offered to place pt on BIPAP for sleep. Pt states that she is no where near bedtime. Pt wanted something to eat. RN notified of pt request. Pt given sherbert. Pt instructed to call when she is ready for BIPAP

## 2017-11-04 NOTE — [place] FL2 (Addendum)
[place] MEDICAID FL2 LEVEL OF CARE SCREENING TOOL     IDENTIFICATION  Patient Name: Rebecca Bird Birthdate: 10-27-1960 Sex: female Admission Date (Current Location): 11/01/2017  [place]   Facility and Address:  [facility], [address], [place], [place] [address]      Provider Number: [id number]  Attending Physician Name and Address:  Enid BaasKalisetti, Radhika, MD  Relative Name and Phone Number:  Dorothey Basemanennington, Danielle Daughter   [phone]     Current Level of Care: Hospital Recommended Level of Care: Skilled Nursing Facility Prior Approval Number:    Date Approved/Denied:   PASRR Number: [id number]  Discharge Plan: SNF    Current Diagnoses: Patient Active Problem List   Diagnosis Date Noted  . Acute on chronic respiratory failure with hypoxemia (HCC) 11/01/2017    Orientation RESPIRATION BLADDER Height & Weight     Self, Time, Situation, Place  O2(4L) Incontinent Weight: (!) 451 lb (204.6 kg) Height:  5\' 7"  (170.2 cm)  BEHAVIORAL SYMPTOMS/MOOD NEUROLOGICAL BOWEL NUTRITION STATUS      Continent Diet(Cardiac)  AMBULATORY STATUS COMMUNICATION OF NEEDS Skin   Limited Assist Verbally Normal                       Personal Care Assistance Level of Assistance  Bathing, Feeding, Dressing Bathing Assistance: Limited assistance Feeding assistance: Independent Dressing Assistance: Limited assistance     Functional Limitations Info  Hearing, Sight, Speech Sight Info: Adequate Hearing Info: Adequate Speech Info: Adequate    SPECIAL CARE FACTORS FREQUENCY  PT (By licensed PT), OT (By licensed OT)     PT Frequency: 5x a week OT Frequency: 5x a week            Contractures Contractures Info: Not present    Additional Factors Info  Code Status, Allergies Code Status Info: Full Code Allergies Info: LEVAQUIN LEVOFLOXACIN IN D5W            Current Medications (11/04/2017):  This is the current  hospital active medication list Current Facility-Administered Medications  Medication Dose Route Frequency Provider Last Rate Last Dose  . acetaminophen (TYLENOL) tablet 650 mg  650 mg Oral Q6H PRN Arnaldo Nataliamond, Michael S, MD   650 mg at 11/04/17 [id number]   Or  . acetaminophen (TYLENOL) suppository 650 mg  650 mg Rectal Q6H PRN Arnaldo Nataliamond, Michael S, MD      . azithromycin [facility](ZITHROMAX) tablet 500 mg  500 mg Oral Daily Conforti, John, DO   500 mg at 11/04/17 0856  . budesonide (PULMICORT) nebulizer solution 0.25 mg  0.25 mg Nebulization BID Conforti, John, DO   0.25 mg at 11/04/17 0727  . carvedilol (COREG) tablet 3.125 mg  3.125 mg Oral BID WC Arnaldo Nataliamond, Michael S, MD   3.125 mg at 11/04/17 0857  . cefTRIAXone (ROCEPHIN) 1 g in sodium chloride 0.9 % 100 mL IVPB  1 g Intravenous Q24H Conforti, John, DO   Stopped at 11/03/17 1947  . citalopram (CELEXA) tablet 40 mg  40 mg Oral Daily Arnaldo Nataliamond, Michael S, MD   40 mg at 11/04/17 0855  . docusate sodium (COLACE) capsule 100 mg  100 mg Oral BID Arnaldo Nataliamond, Michael S, MD   100 mg at 11/04/17 0856  . enoxaparin (LOVENOX) injection 40 mg  40 mg Subcutaneous Q12H Milagros LollSudini, Srikar, MD   40 mg at 11/04/17 0857  . ferrous sulfate tablet 325 mg  325 mg Oral Q breakfast Arnaldo Nataliamond, Michael S, MD  325 mg at 11/04/17 0856  . furosemide (LASIX) tablet 120 mg  120 mg Oral Daily Arnaldo Natal, MD   120 mg at 11/04/17 0857  . ipratropium-albuterol (DUONEB) 0.5-2.5 (3) MG/3ML nebulizer solution 3 mL  3 mL Nebulization Q6H PRN Arnaldo Natal, MD   3 mL at 11/01/17 2027  . levothyroxine (SYNTHROID, LEVOTHROID) tablet 100 mcg  100 mcg Oral QAC breakfast Conforti, John, DO   100 mcg at 11/04/17 0554  . methylPREDNISolone sodium succinate (SOLU-MEDROL) 125 mg/2 mL injection 60 mg  60 mg Intravenous Q6H Conforti, John, DO   60 mg at 11/04/17 1540  . ondansetron (ZOFRAN) tablet 4 mg  4 mg Oral Q6H PRN Arnaldo Natal, MD       Or  . ondansetron [facility]) injection 4 mg  4 mg Intravenous  Q6H PRN Arnaldo Natal, MD      . potassium chloride (K-DUR,KLOR-CON) CR tablet 10 mEq  10 mEq Oral Daily Arnaldo Natal, MD   10 mEq at 11/04/17 0856  . traMADol (ULTRAM) tablet 50 mg  50 mg Oral Q6H PRN Arnaldo Natal, MD   50 mg at 11/03/17 2339     Discharge Medications: Please see discharge summary for a list of discharge medications.  Relevant Imaging Results:  Relevant Lab Results:   Additional Information SSN [id number]  Darleene Cleaver, [place]

## 2017-11-05 LAB — BASIC METABOLIC PANEL
BUN: 39 mg/dL — AB (ref 6–20)
CALCIUM: 8.5 mg/dL — AB (ref 8.9–10.3)
CO2: >50 mmol/L — ABNORMAL HIGH (ref 22–32)
CREATININE: 0.85 mg/dL (ref 0.44–1.00)
Chloride: 83 mmol/L — ABNORMAL LOW (ref 98–111)
GFR calc Af Amer: >60 mL/min (ref >60)
GLUCOSE: 120 mg/dL — AB (ref 70–99)
Potassium: 4.6 mmol/L (ref 3.5–5.1)
Sodium: 145 mmol/L (ref 135–145)

## 2017-11-05 MED ORDER — LEVOTHYROXINE SODIUM 100 MCG PO TABS: 100.0000 ug | ORAL_TABLET | Freq: Every day | ORAL | 2 refills | Status: AC

## 2017-11-05 MED ORDER — DOCUSATE SODIUM 100 MG PO CAPS: 100.0000 mg | ORAL_CAPSULE | Freq: Two times a day (BID) | ORAL | 0 refills | Status: DC

## 2017-11-05 MED ORDER — CEFUROXIME AXETIL 500 MG PO TABS: 500.0000 mg | ORAL_TABLET | Freq: Two times a day (BID) | ORAL | 0 refills | Status: AC

## 2017-11-05 MED ORDER — IPRATROPIUM-ALBUTEROL 0.5-2.5 (3) MG/3ML IN SOLN: 3.0000 mL | Freq: Four times a day (QID) | RESPIRATORY_TRACT | 1 refills | Status: AC | PRN

## 2017-11-05 MED ORDER — TRAMADOL HCL 50 MG PO TABS: 50.0000 mg | ORAL_TABLET | Freq: Four times a day (QID) | ORAL | 0 refills | Status: DC | PRN

## 2017-11-05 MED ORDER — PREDNISONE 20 MG PO TABS: ORAL_TABLET | ORAL | 0 refills | Status: DC

## 2017-11-05 NOTE — Progress Notes (Signed)
Chaplain responded to a request from nurse to complete AD. Chaplain recruited notary and witness and executed AD. Pt had family at the bedside.    11/05/17 1500  Clinical Encounter Type  Visited With Patient and family together  Visit Type Follow-up  Referral From Nurse  Spiritual Encounters  Spiritual Needs Brochure;Prayer

## 2017-11-05 NOTE — Discharge Summary (Signed)
[facility] [place] [facility]   PATIENT NAME: Rebecca Bird    MR#:  [id number]  DATE OF BIRTH:  17-Jun-1960  DATE OF ADMISSION:  11/01/2017   ADMITTING PHYSICIAN: Arnaldo NatalMichael S Diamond, MD  DATE OF DISCHARGE:  11/05/17  PRIMARY CARE PHYSICIAN: Abner GreenspanHodges, Beth, MD   ADMISSION DIAGNOSIS:   Acute respiratory failure with hypercapnia (HCC) [J96.02]  DISCHARGE DIAGNOSIS:   Active Problems:   Acute on chronic respiratory failure with hypoxemia (HCC)   SECONDARY DIAGNOSIS:   Past Medical History:  Diagnosis Date  . Anemia   . CHF (congestive heart failure) (HCC)   . COPD (chronic obstructive pulmonary disease) (HCC)   . Depression   . Hypertension   . Sleep apnea   . Thyroid disease     HOSPITAL COURSE:    57 year old female with morbid obesity, diastolic CHF, COPD on 4 L home oxygen and sleep apnea presents to hospital secondary to shortness of breath and hypoxia  1.  Acute on chronic respiratory failure-secondary to COPD exacerbation and obesity hypoventilation -was on BiPAP in the ICU.  Now weaned to her 4 L chronic home oxygen during daytime -Continue  CPAP at bedtime see if patient would qualify for noninvasive ventilator over at the rehab every night -On IV steroids-change to oral steroids at discharge., daily oral Lasix. -Developed allergic reaction to Levaquin.    And then received Rocephin and azithromycin-change to Ceftin at discharge -Appreciate pulmonary consult.  2.  Chronic diastolic CHF-enlarged heart and mild pulmonary vascular congestion.  On oral high-dose Lasix. -low-salt diet advised and also recommend weight loss  3.  Anemia of chronic disease-stable.  No indication for transfusion  4.  Weakness-physical therapy consulted.  Patient and family very interested in placement.  Patient repeatedly saying that she is unable to care for herself at home. -Patient is [facility] at [facility].  Family interested in long-term  placement -Social worker consulted.  5.  Right shoulder dislocation-seen at [facility], failed attempts to relocate the displacement.  Recommended outpatient follow-up for possible reconstruction surgery. -Discussed with on-call orthopedics.- recommended outpatient follow-up  PT consult is recommended SNF Patient from [facility]- family interested in long term care.  Will be discharged today    DISCHARGE CONDITIONS:   Guarded  CONSULTS OBTAINED:   Intensivist  DRUG ALLERGIES:   Allergies  Allergen Reactions  . Levaquin [Levofloxacin In D5w] Itching and Rash   DISCHARGE MEDICATIONS:   Allergies as of 11/05/2017      Reactions   Levaquin [levofloxacin In D5w] Itching, Rash      Medication List    TAKE these medications   acetaminophen 500 MG tablet Commonly known as:  TYLENOL Take 500 mg by mouth every 8 (eight) hours as needed for mild pain.   albuterol 108 (90 Base) MCG/ACT inhaler Commonly known as:  PROVENTIL HFA;VENTOLIN HFA Inhale 2 puffs into the lungs every 4 (four) hours as needed for shortness of breath.   carvedilol 3.125 MG tablet Commonly known as:  COREG Take 3.125 mg by mouth 2 (two) times daily with a meal.   cefUROXime 500 MG tablet Commonly known as:  CEFTIN Take 1 tablet (500 mg total) by mouth 2 (two) times daily with a meal for 7 days.   citalopram 20 MG tablet Commonly known as:  CELEXA Take 40 mg by mouth daily.   diclofenac sodium 1 % Gel Commonly known as:  VOLTAREN Apply 4 g topically 4 (four) times daily.  docusate sodium 100 MG capsule Commonly known as:  COLACE Take 1 capsule (100 mg total) by mouth 2 (two) times daily.   ferrous sulfate 325 (65 FE) MG tablet Take 325 mg by mouth daily with breakfast.   furosemide 80 MG tablet Commonly known as:  LASIX Take 120 mg by mouth daily.   ipratropium-albuterol 0.5-2.5 (3) MG/3ML Soln Commonly known as:  DUONEB Take 3 mLs by nebulization every 6  (six) hours as needed (shortness of breath). What changed:  when to take this   levothyroxine 100 MCG tablet Commonly known as:  SYNTHROID, LEVOTHROID Take 1 tablet (100 mcg total) by mouth daily before breakfast. Start taking on:  11/06/2017 What changed:    medication strength  how much to take   potassium chloride 10 MEQ tablet Commonly known as:  K-DUR Take 10 mEq by mouth daily.   predniSONE 20 MG tablet Commonly known as:  DELTASONE 1 tablet TID for 3 days followed by 1 tablet BID  For 3 days and then 1 tablet once a day for 3 days and stop   traMADol 50 MG tablet Commonly known as:  ULTRAM Take 1 tablet (50 mg total) by mouth every 6 (six) hours as needed for moderate pain.        DISCHARGE INSTRUCTIONS:   1. PCP f/u in 1 week 2. CPAP at nights- see if she can get qualified for a non invasive ventilator at bedtime  DIET:   Cardiac diet  ACTIVITY:   Activity as tolerated  OXYGEN:   Home Oxygen: Yes.    Oxygen Delivery: 4 liters/min via Patient connected to nasal cannula oxygen  DISCHARGE LOCATION:   nursing home   If you experience worsening of your admission symptoms, develop shortness of breath, life threatening emergency, suicidal or homicidal thoughts you must seek medical attention immediately by calling 911 or calling your MD immediately  if symptoms less severe.  You Must read complete instructions/literature along with all the possible adverse reactions/side effects for all the Medicines you take and that have been prescribed to you. Take any new Medicines after you have completely understood and accpet all the possible adverse reactions/side effects.   Please note  You were cared for by a hospitalist during your hospital stay. If you have any questions about your discharge medications or the care you received while you were in the hospital after you are discharged, you can call the unit and asked to speak with the hospitalist on call if the  hospitalist that took care of you is not available. Once you are discharged, your primary care physician will handle any further medical issues. Please note that NO REFILLS for any discharge medications will be authorized once you are discharged, as it is imperative that you return to your primary care physician (or establish a relationship with a primary care physician if you do not have one) for your aftercare needs so that they can reassess your need for medications and monitor your lab values.    On the day of Discharge:  VITAL SIGNS:   Blood pressure 140/69, pulse 76, temperature 97.8 F (36.6 C), temperature source Oral, resp. rate 18, height 5\' 7"  (1.702 m), weight (!) 203.2 kg (448 lb), SpO2 91 %.  PHYSICAL EXAMINATION:     GENERAL:  57 y.o.-year-old morbidly obese patient lying in the bed with no acute distress.  EYES: Pupils equal, round, reactive to light and accommodation. No scleral icterus. Extraocular muscles intact.  HEENT: Head atraumatic, normocephalic.  Oropharynx and nasopharynx clear.  NECK:  Supple, no jugular venous distention. No thyroid enlargement, no tenderness.  LUNGS: scant breath sounds bilaterally, minimal scattered wheezing,no  rales,rhonchi or crepitation. No use of accessory muscles of respiration.  CARDIOVASCULAR: S1, S2 normal. No murmurs, rubs, or gallops.  ABDOMEN: Soft, obese, nontender, nondistended. Bowel sounds present. No organomegaly or mass.  EXTREMITIES: No pedal edema, cyanosis, or clubbing.  NEUROLOGIC: Cranial nerves II through XII are intact. Muscle strength 5/5 in all extremities. Sensation intact. Gait not checked. Global weakness noted. PSYCHIATRIC: The patient is alert and oriented x 3.  SKIN: No obvious rash, lesion, or ulcer.     DATA REVIEW:   CBC Recent Labs  Lab 11/03/17 0407  WBC 6.1  HGB 11.0*  HCT 33.6*  PLT 180    Chemistries  Recent Labs  Lab 11/01/17 0402  11/05/17 0442  NA 142   < > 145  K 5.3*   < > 4.6    CL 87*   < > 83*  CO2 47*   < > >50*  GLUCOSE 120*   < > 120*  BUN 30*   < > 39*  CREATININE 1.05*   < > 0.85  CALCIUM 8.7*   < > 8.5*  AST 37  --   --   ALT 44  --   --   ALKPHOS 75  --   --   BILITOT 1.2  --   --    < > = values in this interval not displayed.     Microbiology Results  Results for orders placed or performed during the hospital encounter of 11/01/17  MRSA PCR Screening     Status: None   Collection Time: 11/01/17  8:55 AM  Result Value Ref Range Status   MRSA by PCR NEGATIVE NEGATIVE Final    Comment:        The GeneXpert MRSA Assay (FDA approved for NASAL specimens only), is one component of a comprehensive MRSA colonization surveillance program. It is not intended to diagnose MRSA infection nor to guide or monitor treatment for MRSA infections. Performed at [facility], [address], [place], [place] [address]     RADIOLOGY:  No results found.   Management plans discussed with the patient, family and they are in agreement.  CODE STATUS:     Code Status Orders  (From admission, onward)        Start     Ordered   11/01/17 0851  Full code  Continuous     11/01/17 0850    Code Status History    This patient has a current code status but no historical code status.      TOTAL TIME TAKING CARE OF THIS PATIENT: 38 minutes.    Enid Baas M.D on 11/05/2017 at 11:22 AM  Between 7am to 6pm - Pager - [phone]  After 6pm go to [email or web address] - Social research officer, government  [facility]  Office  [phone]  CC: Primary care physician; Abner Greenspan, MD   Note: This dictation was prepared with Dragon dictation along with smaller phrase technology. Any transcriptional errors that result from this process are unintentional.

## 2017-11-05 NOTE — Clinical Social Work Note (Signed)
The patient will discharge today to [facility] via non emergent EMS. The patient, the facility, and her family are aware and in agreement. The CSW has delivered the discharge paperwork to the facility and the packet to the chart. The CSW is signing off. Please consult should needs arise.  Rebecca PonderKaren Martha Calab Bird, MSW, Theresia MajorsLCSWA [phone]

## 2017-11-05 NOTE — Progress Notes (Signed)
[facility] has been called for transportation.

## 2017-11-05 NOTE — Progress Notes (Addendum)
Report called to Brynda Rimarol RN, at [facility],  Daughter and family are coming at 1430 to do advanced directives.

## 2017-11-05 NOTE — Progress Notes (Signed)
Pt d/c per MD order to [facility] Report given to Okey Regalarol RN at [facility], Cass[place]onele box and IV removed, clean and intact. Pt d/c via EMS.

## 2017-11-15 ENCOUNTER — Emergency Department: Payer: Medicare Other

## 2017-11-15 ENCOUNTER — Inpatient Hospital Stay
Admission: EM | Admit: 2017-11-15 | Discharge: 2017-11-25 | DRG: 205 | Disposition: A | Discharge status: Skilled Nursing Facility | Payer: Medicare Other | Source: Skilled Nursing Facility | Attending: Internal Medicine | Admitting: Internal Medicine

## 2017-11-15 ENCOUNTER — Other Ambulatory Visit: Payer: Self-pay

## 2017-11-15 DIAGNOSIS — Z515 Encounter for palliative care: Secondary | ICD-10-CM

## 2017-11-15 DIAGNOSIS — J9622 Acute and chronic respiratory failure with hypercapnia: Secondary | ICD-10-CM | POA: Diagnosis present

## 2017-11-15 DIAGNOSIS — Z87891 Personal history of nicotine dependence: Secondary | ICD-10-CM

## 2017-11-15 DIAGNOSIS — I5032 Chronic diastolic (congestive) heart failure: Secondary | ICD-10-CM | POA: Diagnosis present

## 2017-11-15 DIAGNOSIS — Z79899 Other long term (current) drug therapy: Secondary | ICD-10-CM

## 2017-11-15 DIAGNOSIS — Z7951 Long term (current) use of inhaled steroids: Secondary | ICD-10-CM

## 2017-11-15 DIAGNOSIS — I2609 Other pulmonary embolism with acute cor pulmonale: Secondary | ICD-10-CM | POA: Diagnosis present

## 2017-11-15 DIAGNOSIS — J9601 Acute respiratory failure with hypoxia: Secondary | ICD-10-CM

## 2017-11-15 DIAGNOSIS — Z7952 Long term (current) use of systemic steroids: Secondary | ICD-10-CM

## 2017-11-15 DIAGNOSIS — E876 Hypokalemia: Secondary | ICD-10-CM | POA: Diagnosis not present

## 2017-11-15 DIAGNOSIS — E662 Morbid (severe) obesity with alveolar hypoventilation: Secondary | ICD-10-CM | POA: Diagnosis not present

## 2017-11-15 DIAGNOSIS — D638 Anemia in other chronic diseases classified elsewhere: Secondary | ICD-10-CM | POA: Diagnosis present

## 2017-11-15 DIAGNOSIS — Z6841 Body Mass Index (BMI) 40.0 and over, adult: Secondary | ICD-10-CM

## 2017-11-15 DIAGNOSIS — E039 Hypothyroidism, unspecified: Secondary | ICD-10-CM | POA: Diagnosis present

## 2017-11-15 DIAGNOSIS — J9600 Acute respiratory failure, unspecified whether with hypoxia or hypercapnia: Secondary | ICD-10-CM

## 2017-11-15 DIAGNOSIS — J9602 Acute respiratory failure with hypercapnia: Secondary | ICD-10-CM

## 2017-11-15 DIAGNOSIS — Z7189 Other specified counseling: Secondary | ICD-10-CM

## 2017-11-15 DIAGNOSIS — R0602 Shortness of breath: Secondary | ICD-10-CM | POA: Diagnosis not present

## 2017-11-15 DIAGNOSIS — G92 Toxic encephalopathy: Secondary | ICD-10-CM | POA: Diagnosis present

## 2017-11-15 DIAGNOSIS — Z7401 Bed confinement status: Secondary | ICD-10-CM

## 2017-11-15 DIAGNOSIS — I110 Hypertensive heart disease with heart failure: Secondary | ICD-10-CM | POA: Diagnosis present

## 2017-11-15 DIAGNOSIS — E874 Mixed disorder of acid-base balance: Secondary | ICD-10-CM | POA: Diagnosis present

## 2017-11-15 DIAGNOSIS — J9621 Acute and chronic respiratory failure with hypoxia: Secondary | ICD-10-CM | POA: Diagnosis present

## 2017-11-15 DIAGNOSIS — J441 Chronic obstructive pulmonary disease with (acute) exacerbation: Secondary | ICD-10-CM | POA: Diagnosis present

## 2017-11-15 DIAGNOSIS — Z79890 Hormone replacement therapy (postmenopausal): Secondary | ICD-10-CM

## 2017-11-15 DIAGNOSIS — M25519 Pain in unspecified shoulder: Secondary | ICD-10-CM

## 2017-11-15 DIAGNOSIS — G4733 Obstructive sleep apnea (adult) (pediatric): Secondary | ICD-10-CM

## 2017-11-15 DIAGNOSIS — Z9981 Dependence on supplemental oxygen: Secondary | ICD-10-CM

## 2017-11-15 LAB — BLOOD GAS, VENOUS
ACID-BASE EXCESS: 28.5 mmol/L — AB (ref 0.0–2.0)
Bicarbonate: 61 mmol/L — ABNORMAL HIGH (ref 20.0–28.0)
O2 SAT: 88.6 %
Patient temperature: 37
pCO2, Ven: 113 mmHg (ref 44.0–60.0)
pH, Ven: 7.34 (ref 7.250–7.430)
pO2, Ven: 59 mmHg — ABNORMAL HIGH (ref 32.0–45.0)

## 2017-11-15 MED ORDER — ALBUTEROL SULFATE (2.5 MG/3ML) 0.083% IN NEBU
2.5000 mg | INHALATION_SOLUTION | Freq: Once | RESPIRATORY_TRACT | Status: AC
  Administered 2017-11-15: 2.5 mg via RESPIRATORY_TRACT
  Filled 2017-11-15: qty 3

## 2017-11-15 NOTE — ED Triage Notes (Signed)
Pt arrives to ED via [facility] from [facility]. Facility called report to Charge RN Erie Noe(Vanessa) prior to EMS arrival; pt's O2 sats were 56% for close to an hour while the pt was on 4L O2 via BiPap before EMS was called. (1) Douneb given by facility prior to transport, (2) Duonebs and (1) Albuterol t/x given by EMS en route. Pt arrives with BiPap in place, current arrival O2 sats of 95%. Dr Marisa SeverinSiadecki at bedside upon pt's arrival. Of note, EMS reports pt with a right shoulder dislocation/fracture with unknown date of injury.

## 2017-11-15 NOTE — ED Provider Notes (Signed)
[facility] Emergency Department Provider Note ____________________________________________   First MD Initiated Contact with Patient 11/15/17 2333     (approximate)  I have reviewed the triage vital signs and the nursing notes.   HISTORY  Chief Complaint Respiratory Distress  Level 5 caveat: History of present illness limited due to respiratory distress  HPI Rebecca Bird is a 57 y.o. female with multiple past medical problems as below including COPD and CHF, on home O2 and CPAP who presents with shortness of breath over the last several hours.  Per EMS, the O2 saturation was in the 60s to 70s on their arrival.  The patient improved when placed on CPAP by EMS.  She reports some right arm pain due to recent shoulder dislocation but denies other acute pain and states that she is feeling much better after the CPAP by EMS.   Past Medical History:  Diagnosis Date  . Anemia   . CHF (congestive heart failure) (HCC)   . COPD (chronic obstructive pulmonary disease) (HCC)   . Depression   . Hypertension   . Sleep apnea   . Thyroid disease     Patient Active Problem List   Diagnosis Date Noted  . Acute on chronic respiratory failure with hypoxemia (HCC) 11/01/2017    History reviewed. No pertinent surgical history.  Prior to Admission medications   Medication Sig Start Date End Date Taking? Authorizing Provider  acetaminophen (TYLENOL) 500 MG tablet Take 500 mg by mouth every 8 (eight) hours as needed for mild pain.    Yes [provider]  albuterol (PROVENTIL HFA;VENTOLIN HFA) 108 (90 Base) MCG/ACT inhaler Inhale 2 puffs into the lungs every 4 (four) hours as needed for shortness of breath.    Yes [provider]  carvedilol (COREG) 3.125 MG tablet Take 3.125 mg by mouth 2 (two) times daily with a meal.   Yes [provider]  citalopram (CELEXA) 40 MG tablet Take 40 mg by mouth daily.    Yes [provider]  diclofenac  sodium (VOLTAREN) 1 % GEL Apply 4 g topically 4 (four) times daily. Apply to right shoulder   Yes [provider]  docusate sodium (COLACE) 100 MG capsule Take 1 capsule (100 mg total) by mouth 2 (two) times daily. 11/05/17  Yes Enid Baas, MD  ferrous sulfate 325 (65 FE) MG tablet Take 325 mg by mouth daily with breakfast.   Yes [provider]  furosemide (LASIX) 40 MG tablet Take 120 mg by mouth daily.    Yes [provider]  ipratropium-albuterol (DUONEB) 0.5-2.5 (3) MG/3ML SOLN Take 3 mLs by nebulization every 6 (six) hours as needed (shortness of breath). 11/05/17  Yes Enid Baas, MD  levothyroxine (SYNTHROID, LEVOTHROID) 100 MCG tablet Take 1 tablet (100 mcg total) by mouth daily before breakfast. 11/06/17  Yes Enid Baas, MD  potassium chloride (K-DUR) 10 MEQ tablet Take 10 mEq by mouth daily.    Yes [provider]  predniSONE (DELTASONE) 20 MG tablet Take 20 mg by mouth daily with breakfast. 11/14/17 11/23/17 Yes [provider]  traMADol (ULTRAM) 50 MG tablet Take 1 tablet (50 mg total) by mouth every 6 (six) hours as needed for moderate pain. 11/05/17  Yes Enid Baas, MD  predniSONE (DELTASONE) 20 MG tablet 1 tablet TID for 3 days followed by 1 tablet BID  For 3 days and then 1 tablet once a day for 3 days and stop Patient not taking: Reported on 11/16/2017 11/05/17  Enid BaasKalisetti, Radhika, MD    Allergies Levaquin [levofloxacin in d5w]  No family history on file.  Social History Social History   Tobacco Use  . Smoking status: Former Games developermoker  . Smokeless tobacco: Never Used  Substance Use Topics  . Alcohol use: Not on file  . Drug use: Not on file    Review of Systems Level 5 caveat: Unable to obtain review of systems due to respiratory distress    ____________________________________________   PHYSICAL EXAM:  VITAL SIGNS: ED Triage Vitals [11/15/17 2331]  Enc Vitals Group     BP      Pulse      Resp        Temp      Temp src      SpO2 95 %     Weight      Height      Head Circumference      Peak Flow      Pain Score      Pain Loc      Pain Edu?      Excl. in GC?     Constitutional: Alert and oriented.  Obese.  Uncomfortable appearing.   Eyes: Conjunctivae are normal.  Head: Atraumatic. Nose: No congestion/rhinnorhea. Mouth/Throat: Mucous membranes are dry.   Neck: Normal range of motion.  Cardiovascular: Normal rate, regular rhythm. Grossly normal heart sounds.  Good peripheral circulation. Respiratory: Increased respiratory effort.  Decreased breath sounds bilaterally with poor air entry. Gastrointestinal: Soft and nontender. No distention.  Genitourinary: No flank tenderness. Musculoskeletal: No lower extremity edema.  Extremities warm and well perfused.  Neurologic:  Normal speech and language. No gross focal neurologic deficits are appreciated.  Skin:  Skin is warm and dry. No rash noted. Psychiatric: Calm and cooperative.  ____________________________________________   LABS (all labs ordered are listed, but only abnormal results are displayed)  Labs Reviewed  BRAIN NATRIURETIC PEPTIDE - Abnormal; Notable for the following components:      Result Value   B Natriuretic Peptide 193.0 (*)    All other components within normal limits  COMPREHENSIVE METABOLIC PANEL - Abnormal; Notable for the following components:   Chloride 85 (*)    CO2 49 (*)    Glucose, Bld 134 (*)    BUN 33 (*)    Calcium 8.8 (*)    All other components within normal limits  CBC WITH DIFFERENTIAL/PLATELET - Abnormal; Notable for the following components:   MCV 101.8 (*)    RDW 17.7 (*)    All other components within normal limits  BLOOD GAS, VENOUS - Abnormal; Notable for the following components:   pCO2, Ven 113 (*)    pO2, Ven 59.0 (*)    Bicarbonate 61.0 (*)    Acid-Base Excess 28.5 (*)    All other components within normal limits  TROPONIN I  LACTIC ACID, PLASMA  URINALYSIS,  COMPLETE (UACMP) WITH MICROSCOPIC   ____________________________________________  EKG  ED ECG REPORT I, Dionne BucySebastian Alonnah Lampkins, the attending physician, personally viewed and interpreted this ECG.  Date: 11/15/2017 EKG Time: 2358 Rate: 97 Rhythm: normal sinus rhythm with PVCs QRS Axis: Borderline right axis Intervals: normal ST/T Wave abnormalities: normal Narrative Interpretation: no evidence of acute ischemia  ____________________________________________  RADIOLOGY  CXR: Cardiomegaly with vascular congestion.  Stable appearance from prior.  ____________________________________________   PROCEDURES  Procedure(s) performed: No  Procedures  Critical Care performed: Yes  CRITICAL CARE Performed by: Dionne BucySebastian Leanard Dimaio   Total critical care time: 30 minutes  Critical care  time was exclusive of separately billable procedures and treating other patients.  Critical care was necessary to treat or prevent imminent or life-threatening deterioration.  Critical care was time spent personally by me on the following activities: development of treatment plan with patient and/or surrogate as well as nursing, discussions with consultants, evaluation of patient's response to treatment, examination of patient, obtaining history from patient or surrogate, ordering and performing treatments and interventions, ordering and review of laboratory studies, ordering and review of radiographic studies, pulse oximetry and re-evaluation of patient's condition.  ____________________________________________   INITIAL IMPRESSION / ASSESSMENT AND PLAN / ED COURSE  Pertinent labs & imaging results that were available during my care of the patient were reviewed by me and considered in my medical decision making (see chart for details).  57 year old female with PMH as noted above presents with acute onset of shortness of breath over the last several hours, with hypoxia on EMS arrival despite being on  her nighttime CPAP and oxygen.    I reviewed the past medical records in Epic; the patient was most recently admitted earlier this month for acute on chronic respiratory failure due to COPD exacerbation.  On exam, the patient is tachypneic and in some respiratory distress although when she was being transferred from EMS CPAP to our BiPAP she was able to speak in short sentences.  The remainder of the exam is as described above.  Overall I suspect most likely COPD versus CHF, with some component of obesity hypoventilation as well.  We will give steroid, additional nebulizer treatments, continue on BiPAP for the next 1 to 2 hours, obtain chest x-ray and lab work-up, and reassess.  I anticipate admission.  ----------------------------------------- 2:08 AM on 11/16/2017 -----------------------------------------  Chest x-ray is unchanged from prior.  The patient continues to improve in terms of work of breathing and she is oxygenating normally on BiPAP when awake.  She feels more comfortable.  VBG shows significantly elevated CO2 which is similar to her last presentation.  Given this finding I will keep her on the BiPAP for now to assist with ventilation.  She is stable for admission at this time.  I signed the patient out to the hospitalist Dr. Anne HahnWillis. ____________________________________________   FINAL CLINICAL IMPRESSION(S) / ED DIAGNOSES  Final diagnoses:  Acute respiratory failure with hypoxia and hypercapnia (HCC)      NEW MEDICATIONS STARTED DURING THIS VISIT:  New Prescriptions   No medications on file     Note:  This document was prepared using Dragon voice recognition software and may include unintentional dictation errors.    Dionne BucySiadecki, Sherrica Niehaus, MD 11/16/17 [phone]

## 2017-11-16 ENCOUNTER — Other Ambulatory Visit: Payer: Self-pay

## 2017-11-16 DIAGNOSIS — J9621 Acute and chronic respiratory failure with hypoxia: Secondary | ICD-10-CM

## 2017-11-16 DIAGNOSIS — E039 Hypothyroidism, unspecified: Secondary | ICD-10-CM

## 2017-11-16 DIAGNOSIS — I5032 Chronic diastolic (congestive) heart failure: Secondary | ICD-10-CM | POA: Diagnosis present

## 2017-11-16 DIAGNOSIS — Z79899 Other long term (current) drug therapy: Secondary | ICD-10-CM | POA: Diagnosis not present

## 2017-11-16 DIAGNOSIS — Z515 Encounter for palliative care: Secondary | ICD-10-CM | POA: Diagnosis not present

## 2017-11-16 DIAGNOSIS — G4733 Obstructive sleep apnea (adult) (pediatric): Secondary | ICD-10-CM | POA: Diagnosis not present

## 2017-11-16 DIAGNOSIS — J441 Chronic obstructive pulmonary disease with (acute) exacerbation: Secondary | ICD-10-CM | POA: Diagnosis present

## 2017-11-16 DIAGNOSIS — Z7952 Long term (current) use of systemic steroids: Secondary | ICD-10-CM | POA: Diagnosis not present

## 2017-11-16 DIAGNOSIS — G92 Toxic encephalopathy: Secondary | ICD-10-CM | POA: Diagnosis present

## 2017-11-16 DIAGNOSIS — Z79890 Hormone replacement therapy (postmenopausal): Secondary | ICD-10-CM | POA: Diagnosis not present

## 2017-11-16 DIAGNOSIS — J9622 Acute and chronic respiratory failure with hypercapnia: Secondary | ICD-10-CM

## 2017-11-16 DIAGNOSIS — E662 Morbid (severe) obesity with alveolar hypoventilation: Secondary | ICD-10-CM | POA: Diagnosis present

## 2017-11-16 DIAGNOSIS — R0602 Shortness of breath: Secondary | ICD-10-CM | POA: Diagnosis present

## 2017-11-16 DIAGNOSIS — Z7951 Long term (current) use of inhaled steroids: Secondary | ICD-10-CM | POA: Diagnosis not present

## 2017-11-16 DIAGNOSIS — I110 Hypertensive heart disease with heart failure: Secondary | ICD-10-CM | POA: Diagnosis present

## 2017-11-16 DIAGNOSIS — E873 Alkalosis: Secondary | ICD-10-CM

## 2017-11-16 DIAGNOSIS — J9602 Acute respiratory failure with hypercapnia: Secondary | ICD-10-CM | POA: Diagnosis not present

## 2017-11-16 DIAGNOSIS — Z7401 Bed confinement status: Secondary | ICD-10-CM | POA: Diagnosis not present

## 2017-11-16 DIAGNOSIS — D638 Anemia in other chronic diseases classified elsewhere: Secondary | ICD-10-CM | POA: Diagnosis present

## 2017-11-16 DIAGNOSIS — Z7189 Other specified counseling: Secondary | ICD-10-CM | POA: Diagnosis not present

## 2017-11-16 DIAGNOSIS — Z87891 Personal history of nicotine dependence: Secondary | ICD-10-CM | POA: Diagnosis not present

## 2017-11-16 DIAGNOSIS — E876 Hypokalemia: Secondary | ICD-10-CM | POA: Diagnosis not present

## 2017-11-16 DIAGNOSIS — Z6841 Body Mass Index (BMI) 40.0 and over, adult: Secondary | ICD-10-CM | POA: Diagnosis not present

## 2017-11-16 DIAGNOSIS — I2609 Other pulmonary embolism with acute cor pulmonale: Secondary | ICD-10-CM | POA: Diagnosis present

## 2017-11-16 DIAGNOSIS — Z9981 Dependence on supplemental oxygen: Secondary | ICD-10-CM | POA: Diagnosis not present

## 2017-11-16 DIAGNOSIS — E874 Mixed disorder of acid-base balance: Secondary | ICD-10-CM | POA: Diagnosis present

## 2017-11-16 DIAGNOSIS — J9601 Acute respiratory failure with hypoxia: Secondary | ICD-10-CM | POA: Diagnosis not present

## 2017-11-16 LAB — BASIC METABOLIC PANEL
Anion gap: 10 (ref 5–15)
BUN: 31 mg/dL — ABNORMAL HIGH (ref 6–20)
CHLORIDE: 86 mmol/L — AB (ref 98–111)
CO2: 48 mmol/L — ABNORMAL HIGH (ref 22–32)
CREATININE: 0.7 mg/dL (ref 0.44–1.00)
Calcium: 8.8 mg/dL — ABNORMAL LOW (ref 8.9–10.3)
Glucose, Bld: 148 mg/dL — ABNORMAL HIGH (ref 70–99)
Potassium: 4.3 mmol/L (ref 3.5–5.1)
SODIUM: 144 mmol/L (ref 135–145)

## 2017-11-16 LAB — CBC WITH DIFFERENTIAL/PLATELET
BAND NEUTROPHILS: 1 %
BASOS ABS: 0.1 10*3/uL (ref 0–0.1)
Basophils Relative: 1 %
Blasts: 0 %
EOS ABS: 0.1 10*3/uL (ref 0–0.7)
EOS PCT: 1 %
HCT: 38.9 % (ref 35.0–47.0)
Hemoglobin: 12.4 g/dL (ref 12.0–16.0)
LYMPHS ABS: 2.3 10*3/uL (ref 1.0–3.6)
Lymphocytes Relative: 27 %
MCH: 32.6 pg (ref 26.0–34.0)
MCHC: 32 g/dL (ref 32.0–36.0)
MCV: 101.8 fL — ABNORMAL HIGH (ref 80.0–100.0)
METAMYELOCYTES PCT: 2 %
MONO ABS: 0.6 10*3/uL (ref 0.2–0.9)
MONOS PCT: 7 %
MYELOCYTES: 1 %
NEUTROS ABS: 5.3 10*3/uL (ref 1.4–6.5)
Neutrophils Relative %: 60 %
Other: 0 %
PLATELETS: 234 10*3/uL (ref 150–440)
Promyelocytes Relative: 0 %
RBC: 3.82 MIL/uL (ref 3.80–5.20)
RDW: 17.7 % — AB (ref 11.5–14.5)
WBC: 8.4 10*3/uL (ref 3.6–11.0)
nRBC: 0 /100 WBC

## 2017-11-16 LAB — COMPREHENSIVE METABOLIC PANEL
ALK PHOS: 83 U/L (ref 38–126)
ALT: 33 U/L (ref 0–44)
AST: 26 U/L (ref 15–41)
Albumin: 3.5 g/dL (ref 3.5–5.0)
Anion gap: 10 (ref 5–15)
BUN: 33 mg/dL — AB (ref 6–20)
CALCIUM: 8.8 mg/dL — AB (ref 8.9–10.3)
CO2: 49 mmol/L — AB (ref 22–32)
CREATININE: 0.73 mg/dL (ref 0.44–1.00)
Chloride: 85 mmol/L — ABNORMAL LOW (ref 98–111)
Glucose, Bld: 134 mg/dL — ABNORMAL HIGH (ref 70–99)
Potassium: 4.7 mmol/L (ref 3.5–5.1)
Sodium: 144 mmol/L (ref 135–145)
Total Bilirubin: 1.1 mg/dL (ref 0.3–1.2)
Total Protein: 6.8 g/dL (ref 6.5–8.1)

## 2017-11-16 LAB — GLUCOSE, CAPILLARY
Glucose-Capillary: 117 mg/dL — ABNORMAL HIGH (ref 70–99)
Glucose-Capillary: 114 mg/dL — ABNORMAL HIGH (ref 70–99)
Glucose-Capillary: 158 mg/dL — ABNORMAL HIGH (ref 70–99)
Glucose-Capillary: 105 mg/dL — ABNORMAL HIGH (ref 70–99)
Glucose-Capillary: 136 mg/dL — ABNORMAL HIGH (ref 70–99)

## 2017-11-16 LAB — TROPONIN I: Troponin I: <0.03 ng/mL (ref <0.03)

## 2017-11-16 LAB — BRAIN NATRIURETIC PEPTIDE: B Natriuretic Peptide: 193 pg/mL — ABNORMAL HIGH (ref 0.0–100.0)

## 2017-11-16 LAB — MRSA PCR SCREENING: MRSA by PCR: NEGATIVE

## 2017-11-16 LAB — LACTIC ACID, PLASMA: LACTIC ACID, VENOUS: 1 mmol/L (ref 0.5–1.9)

## 2017-11-16 MED ORDER — ACETAMINOPHEN 650 MG RE SUPP: 650.0000 mg | Freq: Four times a day (QID) | RECTAL | Status: DC | PRN

## 2017-11-16 MED ORDER — CHLORHEXIDINE GLUCONATE 0.12 % MT SOLN
15.0000 mL | Freq: Two times a day (BID) | OROMUCOSAL | Status: DC
  Administered 2017-11-16 – 2017-11-24 (×15): 15 mL via OROMUCOSAL
  Filled 2017-11-16 (×13): qty 15

## 2017-11-16 MED ORDER — INSULIN ASPART 100 UNIT/ML ~~LOC~~ SOLN: 0.0000 [IU] | Freq: Every day | SUBCUTANEOUS | Status: DC

## 2017-11-16 MED ORDER — ONDANSETRON HCL 4 MG/2ML IJ SOLN: 4.0000 mg | Freq: Four times a day (QID) | INTRAMUSCULAR | Status: DC | PRN

## 2017-11-16 MED ORDER — FERROUS SULFATE 325 (65 FE) MG PO TABS
325.0000 mg | ORAL_TABLET | Freq: Every day | ORAL | Status: DC
  Administered 2017-11-16 – 2017-11-25 (×8): 325 mg via ORAL
  Filled 2017-11-16 (×10): qty 1

## 2017-11-16 MED ORDER — ENOXAPARIN SODIUM 40 MG/0.4ML ~~LOC~~ SOLN
40.0000 mg | Freq: Two times a day (BID) | SUBCUTANEOUS | Status: DC
  Administered 2017-11-16 – 2017-11-18 (×5): 40 mg via SUBCUTANEOUS
  Filled 2017-11-16 (×4): qty 0.4

## 2017-11-16 MED ORDER — CITALOPRAM HYDROBROMIDE 20 MG PO TABS
40.0000 mg | ORAL_TABLET | Freq: Every day | ORAL | Status: DC
  Administered 2017-11-16 – 2017-11-25 (×10): 40 mg via ORAL
  Filled 2017-11-16 (×10): qty 2

## 2017-11-16 MED ORDER — INSULIN ASPART 100 UNIT/ML ~~LOC~~ SOLN: 0.0000 [IU] | SUBCUTANEOUS | Status: DC

## 2017-11-16 MED ORDER — INSULIN ASPART 100 UNIT/ML ~~LOC~~ SOLN: 4.0000 [IU] | Freq: Three times a day (TID) | SUBCUTANEOUS | Status: DC

## 2017-11-16 MED ORDER — FUROSEMIDE 20 MG PO TABS: 120.0000 mg | ORAL_TABLET | Freq: Every day | ORAL | Status: DC

## 2017-11-16 MED ORDER — SODIUM CHLORIDE 0.9 % IV SOLN: INTRAVENOUS | Status: DC | PRN

## 2017-11-16 MED ORDER — LEVOTHYROXINE SODIUM 100 MCG IV SOLR
50.0000 ug | Freq: Every day | INTRAVENOUS | Status: DC
  Administered 2017-11-16: 50 ug via INTRAVENOUS
  Filled 2017-11-16 (×2): qty 5

## 2017-11-16 MED ORDER — PREDNISONE 20 MG PO TABS: 20.0000 mg | ORAL_TABLET | Freq: Every day | ORAL | Status: DC

## 2017-11-16 MED ORDER — STERILE WATER FOR INJECTION IJ SOLN
INTRAMUSCULAR | Status: AC
  Administered 2017-11-16: 16:00:00
  Filled 2017-11-16: qty 10

## 2017-11-16 MED ORDER — STERILE WATER FOR INJECTION IJ SOLN
INTRAMUSCULAR | Status: AC
  Administered 2017-11-16
  Filled 2017-11-16: qty 10

## 2017-11-16 MED ORDER — CARVEDILOL 3.125 MG PO TABS
3.1250 mg | ORAL_TABLET | Freq: Two times a day (BID) | ORAL | Status: DC
  Administered 2017-11-16 – 2017-11-25 (×12): 3.125 mg via ORAL
  Filled 2017-11-16 (×14): qty 1

## 2017-11-16 MED ORDER — IPRATROPIUM-ALBUTEROL 0.5-2.5 (3) MG/3ML IN SOLN: 3.0000 mL | Freq: Four times a day (QID) | RESPIRATORY_TRACT | Status: DC | PRN

## 2017-11-16 MED ORDER — ENOXAPARIN SODIUM 40 MG/0.4ML ~~LOC~~ SOLN: 40.0000 mg | SUBCUTANEOUS | Status: DC

## 2017-11-16 MED ORDER — ACETAZOLAMIDE SODIUM 500 MG IJ SOLR
500.0000 mg | Freq: Four times a day (QID) | INTRAMUSCULAR | Status: AC
  Administered 2017-11-16 (×2): 500 mg via INTRAVENOUS
  Filled 2017-11-16 (×2): qty 500

## 2017-11-16 MED ORDER — IPRATROPIUM-ALBUTEROL 0.5-2.5 (3) MG/3ML IN SOLN
3.0000 mL | Freq: Four times a day (QID) | RESPIRATORY_TRACT | Status: DC
  Administered 2017-11-16 – 2017-11-25 (×37): 3 mL via RESPIRATORY_TRACT
  Filled 2017-11-16 (×36): qty 3

## 2017-11-16 MED ORDER — ACETAMINOPHEN 325 MG PO TABS
650.0000 mg | ORAL_TABLET | Freq: Four times a day (QID) | ORAL | Status: DC | PRN
  Administered 2017-11-19 – 2017-11-20 (×3): 650 mg via ORAL
  Filled 2017-11-16 (×3): qty 2

## 2017-11-16 MED ORDER — PNEUMOCOCCAL VAC POLYVALENT 25 MCG/0.5ML IJ INJ: 0.5000 mL | INJECTION | INTRAMUSCULAR | Status: DC

## 2017-11-16 MED ORDER — STERILE WATER FOR INJECTION IJ SOLN
INTRAMUSCULAR | Status: AC
  Administered 2017-11-16: 10 mL
  Filled 2017-11-16: qty 10

## 2017-11-16 MED ORDER — INSULIN ASPART 100 UNIT/ML ~~LOC~~ SOLN
0.0000 [IU] | Freq: Three times a day (TID) | SUBCUTANEOUS | Status: DC
  Administered 2017-11-16: 3 [IU] via SUBCUTANEOUS
  Administered 2017-11-17 – 2017-11-22 (×6): 2 [IU] via SUBCUTANEOUS
  Administered 2017-11-22: 3 [IU] via SUBCUTANEOUS
  Administered 2017-11-23 – 2017-11-25 (×3): 2 [IU] via SUBCUTANEOUS
  Filled 2017-11-16 (×12): qty 1

## 2017-11-16 MED ORDER — POTASSIUM CHLORIDE CRYS ER 20 MEQ PO TBCR
40.0000 meq | EXTENDED_RELEASE_TABLET | Freq: Every day | ORAL | Status: DC
Start: 2017-11-17 — End: 2017-11-21
  Administered 2017-11-17 – 2017-11-20 (×3): 40 meq via ORAL
  Filled 2017-11-16 (×4): qty 2

## 2017-11-16 MED ORDER — ONDANSETRON HCL 4 MG PO TABS: 4.0000 mg | ORAL_TABLET | Freq: Four times a day (QID) | ORAL | Status: DC | PRN

## 2017-11-16 MED ORDER — TRAMADOL HCL 50 MG PO TABS
50.0000 mg | ORAL_TABLET | Freq: Four times a day (QID) | ORAL | Status: DC | PRN
  Administered 2017-11-16 – 2017-11-25 (×7): 50 mg via ORAL
  Filled 2017-11-16 (×7): qty 1

## 2017-11-16 MED ORDER — DOCUSATE SODIUM 100 MG PO CAPS
100.0000 mg | ORAL_CAPSULE | Freq: Two times a day (BID) | ORAL | Status: DC
  Administered 2017-11-16 – 2017-11-24 (×15): 100 mg via ORAL
  Filled 2017-11-16 (×15): qty 1

## 2017-11-16 MED ORDER — LEVOTHYROXINE SODIUM 100 MCG PO TABS: 100.0000 ug | ORAL_TABLET | Freq: Every day | ORAL | Status: DC

## 2017-11-16 MED ORDER — POTASSIUM CHLORIDE CRYS ER 10 MEQ PO TBCR
10.0000 meq | EXTENDED_RELEASE_TABLET | Freq: Every day | ORAL | Status: DC
  Administered 2017-11-16: 10 meq via ORAL
  Filled 2017-11-16: qty 1

## 2017-11-16 MED ORDER — ACETAZOLAMIDE SODIUM 500 MG IJ SOLR
500.0000 mg | Freq: Once | INTRAMUSCULAR | Status: AC
  Administered 2017-11-16: 500 mg via INTRAVENOUS
  Filled 2017-11-16: qty 500

## 2017-11-16 MED ORDER — ORAL CARE MOUTH RINSE
15.0000 mL | Freq: Two times a day (BID) | OROMUCOSAL | Status: DC
  Administered 2017-11-16 – 2017-11-20 (×8): 15 mL via OROMUCOSAL

## 2017-11-16 NOTE — H&P (Addendum)
Rebecca Bird is an 57 y.o. female.   Chief Complaint: Respiratory distress HPI: The patient with past medical history of COPD, CHF, sleep apnea, hypothyroidism and hypertension presents to the emergency department via EMS due to hypoxia and respiratory distress.  EMS reports the patient had saturations in the low 50s despite supplemental oxygen of 4 L/min via BiPAP mask in the outpatient facility.  She received multiple breathing treatments.  In the emergency department she was given Solu-Medrol but continued to require respiratory support was found to the emergency department staff to call the hospitalist service for admission.  Past Medical History:  Diagnosis Date  . Anemia   . CHF (congestive heart failure) ([place])   . COPD (chronic obstructive pulmonary disease) ([place])   . Depression   . Hypertension   . Sleep apnea   . Thyroid disease     History reviewed. No pertinent surgical history. Patient critically ill and cannot provide history  No family history on file. Patient unable to provide information due to critical illness   Social History:  reports that she has quit smoking. She has never used smokeless tobacco. Her alcohol and drug histories are not on file.  Allergies:  Allergies  Allergen Reactions  . Levaquin [Levofloxacin In D5w] Itching and Rash    Medications Prior to Admission  Medication Sig Dispense Refill  . acetaminophen (TYLENOL) 500 MG tablet Take 500 mg by mouth every 8 (eight) hours as needed for mild pain.     Marland Kitchen albuterol (PROVENTIL HFA;VENTOLIN HFA) 108 (90 Base) MCG/ACT inhaler Inhale 2 puffs into the lungs every 4 (four) hours as needed for shortness of breath.     . carvedilol (COREG) 3.125 MG tablet Take 3.125 mg by mouth 2 (two) times daily with a meal.    . citalopram (CELEXA) 40 MG tablet Take 40 mg by mouth daily.     . diclofenac sodium (VOLTAREN) 1 % GEL Apply 4 g topically 4 (four) times daily. Apply to right shoulder    . docusate sodium (COLACE)  100 MG capsule Take 1 capsule (100 mg total) by mouth 2 (two) times daily. 10 capsule 0  . ferrous sulfate 325 (65 FE) MG tablet Take 325 mg by mouth daily with breakfast.    . furosemide (LASIX) 40 MG tablet Take 120 mg by mouth daily.     Marland Kitchen ipratropium-albuterol (DUONEB) 0.5-2.5 (3) MG/3ML SOLN Take 3 mLs by nebulization every 6 (six) hours as needed (shortness of breath). 360 mL 1  . levothyroxine (SYNTHROID, LEVOTHROID) 100 MCG tablet Take 1 tablet (100 mcg total) by mouth daily before breakfast. 30 tablet 2  . potassium chloride (K-DUR) 10 MEQ tablet Take 10 mEq by mouth daily.     . predniSONE (DELTASONE) 20 MG tablet Take 20 mg by mouth daily with breakfast.    . traMADol (ULTRAM) 50 MG tablet Take 1 tablet (50 mg total) by mouth every 6 (six) hours as needed for moderate pain. 20 tablet 0    Results for orders placed or performed during the hospital encounter of 11/15/17 (from the past 48 hour(s))  Brain natriuretic peptide     Status: Abnormal   Collection Time: 11/15/17 11:35 PM  Result Value Ref Range   B Natriuretic Peptide 193.0 (H) 0.0 - 100.0 pg/mL    Comment: Performed at [facility], [address] [place], [place] [address]  Comprehensive metabolic panel     Status: Abnormal   Collection Time: 11/15/17 11:37 PM  Result  Value Ref Range   Sodium 144 135 - 145 mmol/L   Potassium 4.7 3.5 - 5.1 mmol/L    Comment: HEMOLYSIS AT THIS LEVEL MAY AFFECT RESULT   Chloride 85 (L) 98 - 111 mmol/L   CO2 49 (H) 22 - 32 mmol/L   Glucose, Bld 134 (H) 70 - 99 mg/dL   BUN 33 (H) 6 - 20 mg/dL   Creatinine, Ser 0.73 0.44 - 1.00 mg/dL   Calcium 8.8 (L) 8.9 - 10.3 mg/dL   Total Protein 6.8 6.5 - 8.1 g/dL   Albumin 3.5 3.5 - 5.0 g/dL   AST 26 15 - 41 U/L   ALT 33 0 - 44 U/L   Alkaline Phosphatase 83 38 - 126 U/L   Total Bilirubin 1.1 0.3 - 1.2 mg/dL   GFR calc non Af Amer >60 >60 mL/min   GFR calc Af Amer >60 >60 mL/min    Comment: (NOTE) The eGFR has been calculated using  the CKD EPI equation. This calculation has not been validated in all clinical situations. eGFR's persistently <60 mL/min signify possible Chronic Kidney Disease.    Anion gap 10 5 - 15    Comment: Performed at [facility], [address] [place], [place] [address]  Troponin I     Status: None   Collection Time: 11/15/17 11:37 PM  Result Value Ref Range   Troponin I <0.03 <0.03 ng/mL    Comment: Performed at [facility], [address] [place], [place] [address]  CBC with Differential     Status: Abnormal   Collection Time: 11/15/17 11:37 PM  Result Value Ref Range   WBC 8.4 3.6 - 11.0 K/uL   RBC 3.82 3.80 - 5.20 MIL/uL   Hemoglobin 12.4 12.0 - 16.0 g/dL   HCT 38.9 35.0 - 47.0 %   MCV 101.8 (H) 80.0 - 100.0 fL   MCH 32.6 26.0 - 34.0 pg   MCHC 32.0 32.0 - 36.0 g/dL   RDW 17.7 (H) 11.5 - 14.5 %   Platelets 234 150 - 440 K/uL   Neutrophils Relative % 60 %   Lymphocytes Relative 27 %   Monocytes Relative 7 %   Eosinophils Relative 1 %   Basophils Relative 1 %   Band Neutrophils 1 %   Metamyelocytes Relative 2 %   Myelocytes 1 %   Promyelocytes Relative 0 %   Blasts 0 %   nRBC 0 0 /100 WBC   Other 0 %   Neutro Abs 5.3 1.4 - 6.5 K/uL   Lymphs Abs 2.3 1.0 - 3.6 K/uL   Monocytes Absolute 0.6 0.2 - 0.9 K/uL   Eosinophils Absolute 0.1 0 - 0.7 K/uL   Basophils Absolute 0.1 0 - 0.1 K/uL   RBC Morphology MIXED RBC POPULATION     Comment: POLYCHROMASIA PRESENT Performed at [facility], [address] [place], [place] [address]   Lactic acid, plasma     Status: None   Collection Time: 11/15/17 11:37 PM  Result Value Ref Range   Lactic Acid, Venous 1.0 0.5 - 1.9 mmol/L    Comment: Performed at [facility], [address] [place], [place] [address]  Blood gas, venous     Status: Abnormal   Collection Time: 11/15/17 11:37 PM  Result Value Ref Range   pH, Ven 7.34 7.250 - 7.430   pCO2, Ven 113 (HH) 44.0 - 60.0 mmHg    Comment:  CRITICAL RESULT CALLED TO, READ BACK BY AND VERIFIED  WITH: DR.SIADECKI AT 9470 ON 11/15/17 KSL    pO2, Ven 59.0 (H) 32.0 - 45.0 mmHg   Bicarbonate 61.0 (H) 20.0 - 28.0 mmol/L   Acid-Base Excess 28.5 (H) 0.0 - 2.0 mmol/L   O2 Saturation 88.6 %   Patient temperature 37.0    Collection site VENOUS    Sample type VENOUS     Comment: Performed at [facility], [address] [place], [place] [address]   Dg Chest Portable 1 View  Result Date: 11/15/2017 CLINICAL DATA:  Hypoxia EXAM: PORTABLE CHEST 1 VIEW COMPARISON:  11/02/2017 FINDINGS: Asymmetric opacity in the right thorax. Cardiomegaly with vascular congestion. No pleural effusion. IMPRESSION: Cardiomegaly with vascular congestion. Asymmetric opacity in the right thorax may reflect asymmetric edema. Findings do not appear significantly changed as compared with 11/02/2017. Electronically Signed   By: Donavan Foil M.D.   On: 11/15/2017 23:58    Review of Systems  Unable to perform ROS: Critical illness    Blood pressure 131/68, pulse 95, resp. rate 20, height 5' 5" (1.651 m), weight (!) 208.5 kg, SpO2 95 %. Physical Exam  Vitals reviewed. Constitutional: She is oriented to person, place, and time. She appears well-developed and well-nourished. She appears distressed.  HENT:  Head: Normocephalic and atraumatic.  Mouth/Throat: Oropharynx is clear and moist.  Eyes: Pupils are equal, round, and reactive to light. Conjunctivae and EOM are normal. No scleral icterus.  Neck: Normal range of motion. Neck supple. No JVD present. No tracheal deviation present. No thyromegaly present.  Cardiovascular: Normal rate, regular rhythm and normal heart sounds. Exam reveals no gallop and no friction rub.  No murmur heard. Respiratory: Breath sounds normal. She is in respiratory distress.  GI: Soft. Bowel sounds are normal. She exhibits no distension. There is no tenderness.  Genitourinary:  Genitourinary Comments: Deferred  Musculoskeletal:  Normal range of motion. She exhibits no edema.  Lymphadenopathy:    She has no cervical adenopathy.  Neurological: She is alert and oriented to person, place, and time. No cranial nerve deficit. She exhibits normal muscle tone.  Skin: Skin is warm and dry. No rash noted. No erythema.  Psychiatric:  Difficult to assess mental status as patient in distress     Assessment/Plan This is a 58 year old female admitted for respiratory failure with hypoxia and hypercapnia. 1.  Respiratory failure: Acute on chronic; the patient was admitted to the hospital 2 weeks ago for the same.  CO2 significantly elevated.  Follow blood gas for improvement in oxygenation and CO2 ventilation. 2.  CHF: Chronic; diastolic.  Continue Lasix per home regimen  3.  Hypertension: Controlled; continue carvedilol 4.  Hypothyroidism: Stable; continue Synthroid 5.  Right humeral fracture: Present since previous admission.  Patient not an operative candidate as she is high risk for general anesthesia. 6.  DVT prophylaxis: Lovenox 7.  GI prophylaxis: None The patient is a full code.  Time spent on admission orders and critical care approximately 45 minutes. Discussed with [facility] telemedicine.   Harrie Foreman, MD 11/16/2017, 3:19 AM

## 2017-11-16 NOTE — Consult Note (Signed)
Name: Rebecca Bird MRN: [id number] DOB: 1961-04-05    ADMISSION DATE:  11/15/2017 CONSULTATION DATE: 11/16/2017  REFERRING MD : Dr. Sheryle Hailiamond   CHIEF COMPLAINT: Shortness of Breath    BRIEF PATIENT DESCRIPTION:  57 yo female admitted with acute on chronic hypoxic hypercapnic respiratory failure secondary to vascular congestion and AECOPD requiring Bipap  SIGNIFICANT EVENTS/STUDIES:  08/14 Pt admitted to the stepdown unit   HISTORY OF PRESENT ILLNESS:   This is a 57 yo female with a PMH of Hypothyroidism, OSA wears CPAP, HTN, Depression, COPD, CHF, and Anemia.  She presented to [facility] ER on 08/14 via EMS with shortness of breath for several hours and hypoxia O2 sats 60-70's.  Upon EMS arrival she was placed on CPAP.  In the ER she was transitioned to Bipap.  CXR revealed pulmonary edema, BNP 193, and VBG pH 7.34/CO2 113.  She was subsequently admitted to the stepdown unit by hospitalist team for further workup and treatment.    PAST MEDICAL HISTORY :   has a past medical history of Anemia, CHF (congestive heart failure) ([facility]), COPD (chronic obstructive pulmonary disease) ([facility]), Depression, Hypertension, Sleep apnea, and Thyroid disease.  has no past surgical history on file. Prior to Admission medications   Medication Sig Start Date End Date Taking? Authorizing Provider  acetaminophen (TYLENOL) 500 MG tablet Take 500 mg by mouth every 8 (eight) hours as needed for mild pain.    Yes [provider]  albuterol (PROVENTIL HFA;VENTOLIN HFA) 108 (90 Base) MCG/ACT inhaler Inhale 2 puffs into the lungs every 4 (four) hours as needed for shortness of breath.    Yes [provider]  carvedilol (COREG) 3.125 MG tablet Take 3.125 mg by mouth 2 (two) times daily with a meal.   Yes [provider]  citalopram (CELEXA) 40 MG tablet Take 40 mg by mouth daily.    Yes [provider]  diclofenac sodium (VOLTAREN) 1 % GEL Apply 4 g topically 4 (four) times daily. Apply to  right shoulder   Yes [provider]  docusate sodium (COLACE) 100 MG capsule Take 1 capsule (100 mg total) by mouth 2 (two) times daily. 11/05/17  Yes Enid BaasKalisetti, Radhika, MD  ferrous sulfate 325 (65 FE) MG tablet Take 325 mg by mouth daily with breakfast.   Yes [provider]  furosemide (LASIX) 40 MG tablet Take 120 mg by mouth daily.    Yes [provider]  ipratropium-albuterol (DUONEB) 0.5-2.5 (3) MG/3ML SOLN Take 3 mLs by nebulization every 6 (six) hours as needed (shortness of breath). 11/05/17  Yes Enid BaasKalisetti, Radhika, MD  levothyroxine (SYNTHROID, LEVOTHROID) 100 MCG tablet Take 1 tablet (100 mcg total) by mouth daily before breakfast. 11/06/17  Yes Enid BaasKalisetti, Radhika, MD  potassium chloride (K-DUR) 10 MEQ tablet Take 10 mEq by mouth daily.    Yes [provider]  predniSONE (DELTASONE) 20 MG tablet Take 20 mg by mouth daily with breakfast. 11/14/17 11/23/17 Yes [provider]  traMADol (ULTRAM) 50 MG tablet Take 1 tablet (50 mg total) by mouth every 6 (six) hours as needed for moderate pain. 11/05/17  Yes Enid BaasKalisetti, Radhika, MD  predniSONE (DELTASONE) 20 MG tablet 1 tablet TID for 3 days followed by 1 tablet BID  For 3 days and then 1 tablet once a day for 3 days and stop Patient not taking: Reported on 11/16/2017 11/05/17   Enid BaasKalisetti, Radhika, MD   Allergies  Allergen Reactions  . Levaquin [Levofloxacin In D5w] Itching and Rash  FAMILY HISTORY:  family history is not on file. SOCIAL HISTORY:  reports that she has quit smoking. She has never used smokeless tobacco.  REVIEW OF SYSTEMS: Positives in BOLD   Constitutional: Negative for fever, chills, weight loss, malaise/fatigue and diaphoresis.  HENT: Negative for hearing loss, ear pain, nosebleeds, congestion, sore throat, neck pain, tinnitus and ear discharge.   Eyes: Negative for blurred vision, double vision, photophobia, pain, discharge and redness.  Respiratory: cough, hemoptysis, sputum  production, shortness of breath, wheezing and stridor.  Cardiovascular: Negative for chest pain, palpitations, orthopnea, claudication, leg swelling and PND.  Gastrointestinal: Negative for heartburn, nausea, vomiting, abdominal pain, diarrhea, constipation, blood in stool and melena.  Genitourinary: Negative for dysuria, urgency, frequency, hematuria and flank pain.  Musculoskeletal: Negative for myalgias, back pain, joint pain and falls.  Skin: Negative for itching and rash.  Neurological: Negative for dizziness, tingling, tremors, sensory change, speech change, focal weakness, seizures, loss of consciousness, weakness and headaches.  Endo/Heme/Allergies: Negative for environmental allergies and polydipsia. Does not bruise/bleed easily.  SUBJECTIVE:  Pt c/o some shortness of breath   VITAL SIGNS: Pulse Rate:  [93-96] 96 (08/14 0138) Resp:  [18-22] 20 (08/14 0138) BP: (109-135)/(62-96) 109/96 (08/14 0138) SpO2:  [86 %-95 %] 95 % (08/14 0142) FiO2 (%):  [40 %] 40 % (08/14 0138) Weight:  [208.5 kg] 208.5 kg (08/13 2340)  PHYSICAL EXAMINATION: General: well developed, well nourished female, NAD on Bipap  Neuro: alert and oriented, follows commands  HEENT: large neck unable to assess for JVD  Cardiovascular: nsr, rrr, no R/G Lungs: diminished, even, non labored Abdomen: +BS x4, obese, non tender, non distended  Musculoskeletal: trace bilateral lower extremity edema  Skin: intact no rashes or lesions   Recent Labs  Lab 11/15/17 2337  NA 144  K 4.7  CL 85*  CO2 49*  BUN 33*  CREATININE 0.73  GLUCOSE 134*   Recent Labs  Lab 11/15/17 2337  HGB 12.4  HCT 38.9  WBC 8.4  PLT 234   Dg Chest Portable 1 View  Result Date: 11/15/2017 CLINICAL DATA:  Hypoxia EXAM: PORTABLE CHEST 1 VIEW COMPARISON:  11/02/2017 FINDINGS: Asymmetric opacity in the right thorax. Cardiomegaly with vascular congestion. No pleural effusion. IMPRESSION: Cardiomegaly with vascular congestion. Asymmetric  opacity in the right thorax may reflect asymmetric edema. Findings do not appear significantly changed as compared with 11/02/2017. Electronically Signed   By: Jasmine PangKim  Fujinaga M.D.   On: 11/15/2017 23:58    ASSESSMENT / PLAN: Acute on chronic hypoxic hypercapnic respiratory failure secondary to vascular congestion and AECOPD OSA/OHS Chronic dislocated right shoulder-(close attempts at reducing the shoulder at outside hospital, however unsuccessful.  Pt due to follow-up with Dr. Prentiss BellsWaterman a shoulder specialist [facility] for further evaluation and management) Hx: Hypothyroidism, HTN, and Anemia  P: Prn Bipap for dyspnea and/or hypoxia  CPAP or Bipap qhs  Scheduled and prn bronchodilator therapy  Continue prednisone   Continue outpatient lasix  Continuous telemetry monitoring  Trend BMP  Replace electrolytes as indicated  Monitor UOP VTE px: subq lovenox Trend CBC  Monitor for s/sx of bleeding and transfuse for hgb <7 SSI  Prn tramadol for pain management   Sonda Rumbleana Blakeney, AGNP  Pulmonary/Critical Care Pager [phone] (please enter 7 digits) PCCM Consult Pager [phone] (please enter 7 digits)

## 2017-11-16 NOTE — Progress Notes (Signed)
Dorothey Basemananielle Pennington, daughter, notified of pt's admission.

## 2017-11-16 NOTE — Progress Notes (Signed)
Patient with morbid obesity, COPD on chronic home O2 and sleep apnea with CPAP, diastolic CHF and hypertension who is bedbound at baseline was brought in from skilled nursing facility secondary to hypoxia and respiratory distress. -Was not found off her for mask last night and low O2 saturations were recorded. -Was on BiPAP this morning.  Agree with current treatment. -Not done oral or IV steroids, continue inhalers and nebs -Start oral Lasix if tolerated -Management per ICU team

## 2017-11-16 NOTE — ED Notes (Signed)
Pt sleeping at this time with O2 sats noted to be dropping into the low to mid 80's with good waveform. RT called to notify, and will be in room shortly to increase O2%.

## 2017-11-16 NOTE — Care Management Note (Signed)
Case Management Note  Patient Details  Name: Manley Masonammy G Varble MRN: [id number] Date of Birth: 30-Jan-1961  Subjective/Objective:                 Patient presents from [facility].  At time of last discharge- 8/3, she had returned to the facility and there had been discussion of long term care plan of care at the facility.  There is documentation present that there was a need to determine if patient would qualify for non invasive ventilator at  night. She  has chronic oxygen. Placed in icu due to need for continuous bipap   Action/Plan: Reaching out to [facility] to determine if evaluation for niv has been initiated  Expected Discharge Date:                  Expected Discharge Plan:     In-House Referral:     Discharge planning Services     Post Acute Care Choice:    Choice offered to:     DME Arranged:    DME Agency:     HH Arranged:    HH Agency:     Status of Service:     If discussed at [employer] Meetings, dates discussed:    Additional Comments:  Eber HongGreene, Jaquelynn Wanamaker R, RN 11/16/2017, 8:12 AM

## 2017-11-16 NOTE — Progress Notes (Signed)
Lovenox dose adjustment. Patient admitted for SOB and started on lovenox 40 mg subq daily.  Given CrCl 143.7 ml/min, BMI > 40 (75) and CBC WNL will readjust dose to lovenox 40 mg subq bid.  Thomasene Rippleavid Franklyn Cafaro, PharmD, BCPS Clinical Pharmacist 11/16/2017

## 2017-11-16 NOTE — Progress Notes (Signed)
   11/16/17 1945  Clinical Encounter Type  Visited With Patient  Visit Type Initial;Spiritual support  Referral From Nurse  Consult/Referral To Chaplain  Spiritual Encounters  Spiritual Needs Prayer;Emotional   CH received an OR to visit with patient. Ms. Rebecca Bird was alert and thankful for my visit. She spoke briefly about her worries of her family "getting along." She feels like her children are having trouble living without her help. She stated that she always did everything for her kids, which she has two. She asked me to pray which I did and left telling her to have me paged if needed.

## 2017-11-17 LAB — MAGNESIUM: Magnesium: 2.3 mg/dL (ref 1.7–2.4)

## 2017-11-17 LAB — BASIC METABOLIC PANEL
Anion gap: 11 (ref 5–15)
BUN: 31 mg/dL — AB (ref 6–20)
CALCIUM: 8.8 mg/dL — AB (ref 8.9–10.3)
CO2: 47 mmol/L — ABNORMAL HIGH (ref 22–32)
CREATININE: 0.71 mg/dL (ref 0.44–1.00)
Chloride: 86 mmol/L — ABNORMAL LOW (ref 98–111)
GFR calc Af Amer: >60 mL/min (ref >60)
GLUCOSE: 113 mg/dL — AB (ref 70–99)
Potassium: 4.2 mmol/L (ref 3.5–5.1)
Sodium: 144 mmol/L (ref 135–145)

## 2017-11-17 LAB — TSH: TSH: 3.175 u[IU]/mL (ref 0.350–4.500)

## 2017-11-17 LAB — PHOSPHORUS: Phosphorus: 5.4 mg/dL — ABNORMAL HIGH (ref 2.5–4.6)

## 2017-11-17 LAB — GLUCOSE, CAPILLARY
Glucose-Capillary: 129 mg/dL — ABNORMAL HIGH (ref 70–99)
Glucose-Capillary: 133 mg/dL — ABNORMAL HIGH (ref 70–99)
Glucose-Capillary: 126 mg/dL — ABNORMAL HIGH (ref 70–99)
Glucose-Capillary: 122 mg/dL — ABNORMAL HIGH (ref 70–99)

## 2017-11-17 MED ORDER — LEVOTHYROXINE SODIUM 100 MCG PO TABS
100.0000 ug | ORAL_TABLET | Freq: Every day | ORAL | Status: DC
  Administered 2017-11-17 – 2017-11-25 (×9): 100 ug via ORAL
  Filled 2017-11-17 (×9): qty 1

## 2017-11-17 MED ORDER — ACETAZOLAMIDE SODIUM 500 MG IJ SOLR
250.0000 mg | Freq: Four times a day (QID) | INTRAMUSCULAR | Status: AC
  Administered 2017-11-17 (×2): 250 mg via INTRAVENOUS
  Filled 2017-11-17 (×2): qty 250

## 2017-11-17 NOTE — Clinical Social Work Note (Addendum)
CSW notified that patient is a resident at [facility]. She had been discussing long term care with them. CSW has asked [facility] if they will be able to take patient back at discharge and Tresa EndoKelly at [facility] stated that they will be able to. CSW will complete assessment as time permits. York SpanielMonica Kaylaann Mountz MSW,LCSW [phone]

## 2017-11-17 NOTE — Progress Notes (Signed)
[facility] [place] [facility]   PATIENT NAME: Rebecca Bird    MR#:  [id number]  DATE OF BIRTH:  [birth year]  SUBJECTIVE:  CHIEF COMPLAINT:   Chief Complaint  Patient presents with  . Respiratory Distress   - off bipap, on 4l nasal cannula, more alert- feels some better  REVIEW OF SYSTEMS:  Review of Systems  Constitutional: Positive for malaise/fatigue. Negative for chills and fever.  Respiratory: Positive for shortness of breath. Negative for cough and wheezing.   Cardiovascular: Positive for leg swelling. Negative for chest pain and palpitations.  Gastrointestinal: Negative for abdominal pain, constipation, diarrhea, nausea and vomiting.  Genitourinary: Negative for dysuria.  Musculoskeletal: Positive for myalgias.  Neurological: Negative for dizziness, focal weakness, seizures, weakness and headaches.  Psychiatric/Behavioral: Negative for depression, substance abuse and suicidal ideas.    DRUG ALLERGIES:   Allergies  Allergen Reactions  . Levaquin [Levofloxacin In D5w] Itching and Rash    VITALS:  Blood pressure (!) 102/56, pulse 96, temperature 98.1 F (36.7 C), temperature source Oral, resp. rate (!) 21, height 5\' 5"  (1.651 m), weight (!) 204.2 kg, SpO2 91 %.  PHYSICAL EXAMINATION:  Physical Exam  GENERAL:  57 y.o.-year-old morbidly obese patient lying in the bed with no acute distress.  EYES: Pupils equal, round, reactive to light and accommodation. No scleral icterus. Extraocular muscles intact.  HEENT: Head atraumatic, normocephalic. Oropharynx and nasopharynx clear.  NECK:  Supple, no jugular venous distention. No thyroid enlargement, no tenderness.  LUNGS: scant breath sounds bilaterally, minimal scattered wheezing,no  rales,rhonchi or crepitation. No use of accessory muscles of respiration.  CARDIOVASCULAR: S1, S2 normal. No murmurs, rubs, or gallops.  ABDOMEN: Soft, obese, nontender, nondistended. Bowel sounds present. No  organomegaly or mass.  EXTREMITIES: No pedal edema, cyanosis, or clubbing.  NEUROLOGIC: Cranial nerves II through XII are intact. Muscle strength 5/5 in all extremities. Sensation intact. Gait not checked. Global weakness noted. PSYCHIATRIC: The patient is alert and oriented x 3.  SKIN: No obvious rash, lesion, or ulcer.     LABORATORY PANEL:   CBC Recent Labs  Lab 11/15/17 2337  WBC 8.4  HGB 12.4  HCT 38.9  PLT 234   ------------------------------------------------------------------------------------------------------------------  Chemistries  Recent Labs  Lab 11/15/17 2337  11/17/17 0428  NA 144   < > 144  K 4.7   < > 4.2  CL 85*   < > 86*  CO2 49*   < > 47*  GLUCOSE 134*   < > 113*  BUN 33*   < > 31*  CREATININE 0.73   < > 0.71  CALCIUM 8.8*   < > 8.8*  MG  --   --  2.3  AST 26  --   --   ALT 33  --   --   ALKPHOS 83  --   --   BILITOT 1.1  --   --    < > = values in this interval not displayed.   ------------------------------------------------------------------------------------------------------------------  Cardiac Enzymes Recent Labs  Lab 11/15/17 2337  TROPONINI <0.03   ------------------------------------------------------------------------------------------------------------------  RADIOLOGY:  Dg Chest Portable 1 View  Result Date: 11/15/2017 CLINICAL DATA:  Hypoxia EXAM: PORTABLE CHEST 1 VIEW COMPARISON:  11/02/2017 FINDINGS: Asymmetric opacity in the right thorax. Cardiomegaly with vascular congestion. No pleural effusion. IMPRESSION: Cardiomegaly with vascular congestion. Asymmetric opacity in the right thorax may reflect asymmetric edema. Findings do not appear significantly changed as compared with 11/02/2017. Electronically Signed   By: Selena BattenKim  Jake SamplesFujinaga M.D.   On: 11/15/2017 23:58    EKG:   Orders placed or performed during the hospital encounter of 11/15/17  . ED EKG  . ED EKG  . EKG 12-Lead  . EKG 12-Lead    ASSESSMENT AND PLAN:    57 year old female with morbid obesity, diastolic CHF, COPD on 4 L home oxygen and sleep apnea presents to hospital secondary to shortness of breath and hypoxia  1.  Acute on chronic respiratory failure-secondary to COPD exacerbation and obesity hypoventilation -was on BiPAP in the ICU.  Now weaned to her 4 L chronic home oxygen during daytime -Continue BiPAP/CPAP at bedtime and during daytime while sleeping.  -  daily oral Lasix- on hold now. Per pulmonary avoid systemic steroids - for elevated serum bicarb- acetazolamide x 2 doses -Appreciate pulmonary consult.  2.  Chronic diastolic CHF-enlarged heart and mild pulmonary vascular congestion.   Lasix on hold now.  PRN IV Lasix if needed  3.  Anemia of chronic disease-stable.  No indication for transfusion  4.  Weakness-physical therapy consulted.  Patient and family very interested in long term placement.  Patient repeatedly saying that she is unable to care for herself at home. -Patient is currently at [facility].   -Social worker consulted.  5.  DVT prophylaxis-Lovenox  6.  Right shoulder dislocation-seen at [facility], failed attempts to relocate the displacement.  Recommended outpatient follow-up for possible reconstruction surgery.   Consulted palliative care team   All the records are reviewed and case discussed with Care Management/Social Workerr. Management plans discussed with the patient, family and they are in agreement.  CODE STATUS:  Full Code  TOTAL TIME TAKING CARE OF THIS PATIENT: 38 minutes.   POSSIBLE D/C IN 2 DAYS, DEPENDING ON CLINICAL CONDITION.   Enid BaasKALISETTI,Adlee Paar M.D on 11/17/2017 at 3:28 PM  Between 7am to 6pm - Pager - [phone]  After 6pm go to [email or web address] - Social research officer, governmentpassword EPAS [email or web address]  [facility] [place] [facility]  Office  [phone]  CC: Primary care physician; Dinges, Roanna BanningMarie Moore, NP

## 2017-11-17 NOTE — Evaluation (Signed)
Physical Therapy Evaluation Patient Details Name: Rebecca Bird MRN: [id number] DOB: 1961/02/11 Today's Date: 11/17/2017   History of Present Illness  Pt is a 57 yo female that is addmitted to hospital for acute on chronic respiratory failure with hypoxia and hyper capneia. Pt PMH includes COPD, CHF, Sleep apnea, Hypothyroidism, HTN, and chronic O2 home usage. Once addmitted to hospital pt was placed on bipap 8/13 but was found on [place] 4 L O2 8/15 in ICU. Pt had been addmitted to hospital at begining of month for simmilar diagnosis. She was d/c charge from hospital to SNF.   Clinical Impression  Pt is a pleasant 57 year old female who was admitted for acute on chronic respiratory failure with hypoxia and hypercapnia, and PMH listed above. PT monitored HR and O2 saturation throughout session and [facility] saturation 88-91% on 4 L [place]. Pt requires total assist for all mobility. Pt was transferred from bed to bed with lift and +3 assist for pt and room manipulation. Pt demonstrates deficits with functional mobility 2/2 weakness, pain, fatigue, and body habitus. PT recommends d/c to SNF, and will continue to assess pt during hospital stay.     Follow Up Recommendations SNF    Equipment Recommendations  None recommended by PT    Recommendations for Other Services       Precautions / Restrictions Precautions Precautions: Fall Restrictions Weight Bearing Restrictions: No      Mobility  Bed Mobility Overal bed mobility: Needs Assistance Bed Mobility: Rolling Rolling: Total assist         General bed mobility comments: Pt prefrence to roll to Right 2/2 R shoulder pain. Pt require Max A for roll and was very limited 2/2 to shoulder pain.  Transfers Overall transfer level: (pt unable to get out of bed)                  Ambulation/Gait             General Gait Details: pt unable to get out of bed  Stairs            Wheelchair Mobility    Modified Rankin  (Stroke Patients Only)       Balance Overall balance assessment: (not able to assess)                                           Pertinent Vitals/Pain Pain Assessment: No/denies pain    Home Living Family/patient expects to be discharged to:: Skilled nursing facility Living Arrangements: (had been living alone prior) Available Help at Discharge: (family was assisting prior to SNF) Type of Home: Skilled Nursing Facility         Home Equipment: (pt at SNF)      Prior Function Level of Independence: Needs assistance   Gait / Transfers Assistance Needed: Pt was getting assist with all transfer needs at SNF  ADL's / Homemaking Assistance Needed: Pt required assistance with all ADLs at SNF        Hand Dominance        Extremity/Trunk Assessment   Upper Extremity Assessment Upper Extremity Assessment: Generalized weakness(R UE grossly 2/5 MMT 2/2 shoulder pain) RUE Deficits / Details: Not fully assessed to to current dislocation, able to squeeze hand 3/5 RUE Sensation: WNL LUE Deficits / Details: Grossly at least 4-/5 including grip strength, elbow flex/ext and shld  flex    Lower Extremity Assessment Lower Extremity Assessment: Overall WFL for tasks assessed(unable to test out of supine)       Communication   Communication: No difficulties  Cognition Arousal/Alertness: Awake/alert Behavior During Therapy: WFL for tasks assessed/performed Overall Cognitive Status: Within Functional Limits for tasks assessed                                        General Comments      Exercises     Assessment/Plan    PT Assessment    PT Problem List Decreased strength;Decreased range of motion;Decreased activity tolerance;Decreased balance;Decreased mobility;Decreased coordination;Decreased knowledge of use of DME;Decreased safety awareness;Cardiopulmonary status limiting activity;Obesity       PT Treatment Interventions DME  instruction;Gait training;Stair training;Functional mobility training;Therapeutic activities;Therapeutic exercise;Balance training;Neuromuscular re-education;Patient/family education    PT Goals (Current goals can be found in the Care Plan section)  Acute Rehab PT Goals Patient Stated Goal: return to SNF and continue working with PT and then transfer to Long Term care PT Goal Formulation: With patient Time For Goal Achievement: 12/01/17 Potential to Achieve Goals: Fair    Frequency Min 2X/week   Barriers to discharge        Co-evaluation               AM-PAC PT "6 Clicks" Daily Activity  Outcome Measure Difficulty turning over in bed (including adjusting bedclothes, sheets and blankets)?: Unable Difficulty moving from lying on back to sitting on the side of the bed? : Unable Difficulty sitting down on and standing up from a chair with arms (e.g., wheelchair, bedside commode, etc,.)?: Unable Help needed moving to and from a bed to chair (including a wheelchair)?: Total Help needed walking in hospital room?: Total Help needed climbing 3-5 steps with a railing? : Total 6 Click Score: 6    End of Session Equipment Utilized During Treatment: Gait belt     Nurse Communication: Mobility status;Other (comment)(request for tilt table Bariatric) PT Visit Diagnosis: Unsteadiness on feet (R26.81);Other abnormalities of gait and mobility (R26.89);Repeated falls (R29.6);Muscle weakness (generalized) (M62.81);History of falling (Z91.81);Difficulty in walking, not elsewhere classified (R26.2)    Time: [id number] PT Time Calculation (min) (ACUTE ONLY): 20 min   Charges:   PT Evaluation $PT Eval High Complexity: 1 High          Renaldo HarrisonStephen Janese Radabaugh, SPT   Renaldo HarrisonStephen Mozetta Murfin 11/17/2017, 1:50 PM

## 2017-11-17 NOTE — Progress Notes (Signed)
Called report to [place].  Patient being moved to room 150.  Pt is alert and oriented.  Pt has no complaints of pain.

## 2017-11-17 NOTE — Progress Notes (Signed)
Patient alert and oriented. Transferred from regular ICU bed to bariatric bed today. Patient on nasal cannula for most of shift but stated she was tired around 17:00 and wanted to take a nap so was placed back on bipap for sleep. Patient had stated she got adequate sleep overnight ("I slept good, best sleep I've had in a while"). Continued with external catheter due to Diamox given during shift.

## 2017-11-17 NOTE — Progress Notes (Signed)
Much more awake and alert today. Denies dyspnea and pain. Comfortable on [place] O2.  Vitals:   11/17/17 1255 11/17/17 1300 11/17/17 1400 11/17/17 1422  BP: (!) 99/55 95/63 (!) 97/48   Pulse: 91 92 97   Resp: 17 (!) 24 (!) 21   Temp: 98.1 F (36.7 C)     TempSrc: Oral     SpO2: 93% 94% 95% 95%  Weight:      Height:      3 LPM [place]   Gen: Extremely obese, NAD HEENT: NCAT, sclerae white, oropharynx normal Neck: JVP not visualized Lungs: No wheezes or other adventitious sounds Cardiovascular: Regular, no M Abdomen: Obese, NT, NABS Ext: Trace symmetric pretibial edema, chronic venous stasis changes Neuro: No focal neurologic deficits Skin: No lesions noted  BMET    Component Value Date/Time   NA 144 11/17/2017 0428   K 4.2 11/17/2017 0428   CL 86 (L) 11/17/2017 0428   CO2 47 (H) 11/17/2017 0428   GLUCOSE 113 (H) 11/17/2017 0428   BUN 31 (H) 11/17/2017 0428   CREATININE 0.71 11/17/2017 0428   CALCIUM 8.8 (L) 11/17/2017 0428   GFRNONAA >60 11/17/2017 0428   GFRAA >60 11/17/2017 0428    CBC    Component Value Date/Time   WBC 8.4 11/15/2017 2337   RBC 3.82 11/15/2017 2337   HGB 12.4 11/15/2017 2337   HCT 38.9 11/15/2017 2337   PLT 234 11/15/2017 2337   MCV 101.8 (H) 11/15/2017 2337   MCH 32.6 11/15/2017 2337   MCHC 32.0 11/15/2017 2337   RDW 17.7 (H) 11/15/2017 2337   LYMPHSABS 2.3 11/15/2017 2337   MONOABS 0.6 11/15/2017 2337   EOSABS 0.1 11/15/2017 2337   BASOSABS 0.1 11/15/2017 2337     CXR: No new film  IMPRESSION: Acute/chronic hypercarbic resp failure Decompensated OHS/OSA - this is primary problem Former smoker Documented history of COPD but no PFTs to confirm            I doubt current illness is due to AECOPD Mixed acid-base disturbance -chronic resp acidosis with metabolic compensation and primary metabolic alkalosis due to loop diuresis Probable cor pulmonale Hypothyroidism - adequately treated  PLAN/REC:  Continue supplemental oxygen, carefully  titrate to maintain SPO2 88-94% Continue nebulized bronchodilators Avoid all systemic steroids Repeat acetazolamide x2 doses today Continue to monitor BMET closely I would like to see serum bicarb <40 Continue L-thyroxine, changed to p.o. today  Transfer to MedSurg floor. After transfer, PCCM will sign off. Please call if we can be of further assistance. I have scheduled follow-up in pulmonary office in 3 to 4 weeks. Patient's daughter, Duwayne HeckDanielle, updated over the phone  Billy Fischeravid Aara Jacquot, MD PCCM service Mobile [phone] Pager [phone] 11/17/2017 3:07 PM

## 2017-11-18 ENCOUNTER — Telehealth: Payer: Self-pay

## 2017-11-18 ENCOUNTER — Inpatient Hospital Stay: Payer: Medicare Other

## 2017-11-18 DIAGNOSIS — J9602 Acute respiratory failure with hypercapnia: Secondary | ICD-10-CM

## 2017-11-18 DIAGNOSIS — Z515 Encounter for palliative care: Secondary | ICD-10-CM

## 2017-11-18 DIAGNOSIS — J9601 Acute respiratory failure with hypoxia: Secondary | ICD-10-CM

## 2017-11-18 DIAGNOSIS — Z7189 Other specified counseling: Secondary | ICD-10-CM

## 2017-11-18 LAB — BASIC METABOLIC PANEL
Anion gap: 7 (ref 5–15)
BUN: 29 mg/dL — ABNORMAL HIGH (ref 6–20)
CALCIUM: 8.6 mg/dL — AB (ref 8.9–10.3)
CO2: 47 mmol/L — AB (ref 22–32)
CREATININE: 0.76 mg/dL (ref 0.44–1.00)
Chloride: 89 mmol/L — ABNORMAL LOW (ref 98–111)
GFR calc Af Amer: >60 mL/min (ref >60)
GFR calc non Af Amer: >60 mL/min (ref >60)
GLUCOSE: 111 mg/dL — AB (ref 70–99)
Potassium: 3.9 mmol/L (ref 3.5–5.1)
Sodium: 143 mmol/L (ref 135–145)

## 2017-11-18 LAB — CBC
HCT: 34.8 % — ABNORMAL LOW (ref 35.0–47.0)
HEMOGLOBIN: 11.2 g/dL — AB (ref 12.0–16.0)
MCH: 32.9 pg (ref 26.0–34.0)
MCHC: 32.2 g/dL (ref 32.0–36.0)
MCV: 102.1 fL — AB (ref 80.0–100.0)
Platelets: 158 10*3/uL (ref 150–440)
RBC: 3.41 MIL/uL — AB (ref 3.80–5.20)
RDW: 17.7 % — ABNORMAL HIGH (ref 11.5–14.5)
WBC: 5.9 10*3/uL (ref 3.6–11.0)

## 2017-11-18 LAB — PHOSPHORUS: Phosphorus: 4.1 mg/dL (ref 2.5–4.6)

## 2017-11-18 LAB — BLOOD GAS, ARTERIAL
Acid-Base Excess: 21 mmol/L — ABNORMAL HIGH (ref 0.0–2.0)
BICARBONATE: 53.6 mmol/L — AB (ref 20.0–28.0)
FIO2: 0.44
O2 Saturation: 72 %
PATIENT TEMPERATURE: 37
pCO2 arterial: 114 mmHg (ref 32.0–48.0)
pH, Arterial: 7.28 — ABNORMAL LOW (ref 7.350–7.450)
pO2, Arterial: 43 mmHg — ABNORMAL LOW (ref 83.0–108.0)

## 2017-11-18 LAB — GLUCOSE, CAPILLARY
Glucose-Capillary: 111 mg/dL — ABNORMAL HIGH (ref 70–99)
Glucose-Capillary: 152 mg/dL — ABNORMAL HIGH (ref 70–99)
Glucose-Capillary: 147 mg/dL — ABNORMAL HIGH (ref 70–99)
Glucose-Capillary: 112 mg/dL — ABNORMAL HIGH (ref 70–99)
Glucose-Capillary: 125 mg/dL — ABNORMAL HIGH (ref 70–99)

## 2017-11-18 LAB — PROCALCITONIN: Procalcitonin: <0.1 ng/mL

## 2017-11-18 LAB — MAGNESIUM: MAGNESIUM: 2 mg/dL (ref 1.7–2.4)

## 2017-11-18 MED ORDER — ENOXAPARIN SODIUM 40 MG/0.4ML ~~LOC~~ SOLN
40.0000 mg | Freq: Two times a day (BID) | SUBCUTANEOUS | Status: DC
  Administered 2017-11-18 – 2017-11-25 (×14): 40 mg via SUBCUTANEOUS
  Filled 2017-11-18 (×14): qty 0.4

## 2017-11-18 MED ORDER — FUROSEMIDE 10 MG/ML IJ SOLN
40.0000 mg | Freq: Once | INTRAMUSCULAR | Status: AC
  Administered 2017-11-18: 40 mg via INTRAVENOUS
  Filled 2017-11-18: qty 4

## 2017-11-18 MED ORDER — POLYETHYLENE GLYCOL 3350 17 G PO PACK
17.0000 g | PACK | Freq: Every day | ORAL | Status: DC
  Administered 2017-11-18 – 2017-11-23 (×4): 17 g via ORAL
  Filled 2017-11-18 (×6): qty 1

## 2017-11-18 NOTE — Progress Notes (Signed)
Pharmacy Electrolyte Monitoring Consult:  Pharmacy consulted to assist in monitoring and replacing electrolytes in this 57 y.o. female admitted on 11/15/2017 with Respiratory Distress   Labs:  Sodium (mmol/L)  Date Value  11/18/2017 143   Potassium (mmol/L)  Date Value  11/18/2017 3.9   Magnesium (mg/dL)  Date Value  45/40/981108/16/2019 2.0   Phosphorus (mg/dL)  Date Value  91/47/829508/16/2019 4.1   Calcium (mg/dL)  Date Value  62/13/086508/16/2019 8.6 (L)   Albumin (g/dL)  Date Value  78/46/962908/13/2019 3.5    Assessment/Plan: Patient started on potassium 40mEq Daily on 8/15. Patient ordered furosemide 40mg  IV x 1 on 8/16. Potassium trending down, will recheck electrolytes with am labs. Will need to replace orally as possible.   Goal potassium ~ 4, goal magnesium ~2.   Pharmacy will continue to monitor and adjust per consult.   Simpson,Michael L 11/18/2017 3:45 PM

## 2017-11-18 NOTE — Progress Notes (Signed)
Notified patient's daughter of change of room

## 2017-11-18 NOTE — Progress Notes (Signed)
Vitals:   11/18/17 0044 11/18/17 0218 11/18/17 0728 11/18/17 0752  BP: 115/82   (!) 99/51  Pulse: 98   89  Resp: (!) 21     Temp: 98.3 F (36.8 C)   97.6 F (36.4 C)  TempSrc: Oral   Oral  SpO2: 90% 96% 90% 95%  Weight:      Height:      3 LPM [place]  Review of Systems:  Constitutional: Feels better Cardiovascular: No chest pain.  Pulmonary: Denies hemoptysis The remainder of systems were reviewed and were found to be negative other than what is documented in the HPI.      Physical Examination:   VS: BP (!) 99/51 (BP Location: Left Arm)   Pulse 89   Temp 97.6 F (36.4 C) (Oral)   Resp (!) 21   Ht 5\' 5"  (1.651 m)   Wt (!) 204.2 kg   SpO2 95%   BMI 74.91 kg/m   General Appearance: Obese Neuro:without focal findings, mental status, speech normal, alert and oriented HEENT: PERRLA, EOM intact Pulmonary: No wheezing, No rales  CardiovascularNormal S1,S2.  No m/r/g.  Abdomen: Benign, Soft, non-tender, No masses Renal:  No costovertebral tenderness  GU:  No performed at this time. Endoc: No evident thyromegaly, no signs of acromegaly or Cushing features Skin:   warm, no rashes, no ecchymosis  Extremities: normal, no cyanosis, clubbing.   BMET    Component Value Date/Time   NA 143 11/18/2017 0508   K 3.9 11/18/2017 0508   CL 89 (L) 11/18/2017 0508   CO2 47 (H) 11/18/2017 0508   GLUCOSE 111 (H) 11/18/2017 0508   BUN 29 (H) 11/18/2017 0508   CREATININE 0.76 11/18/2017 0508   CALCIUM 8.6 (L) 11/18/2017 0508   GFRNONAA >60 11/18/2017 0508   GFRAA >60 11/18/2017 0508    CBC    Component Value Date/Time   WBC 8.4 11/15/2017 2337   RBC 3.82 11/15/2017 2337   HGB 12.4 11/15/2017 2337   HCT 38.9 11/15/2017 2337   PLT 234 11/15/2017 2337   MCV 101.8 (H) 11/15/2017 2337   MCH 32.6 11/15/2017 2337   MCHC 32.0 11/15/2017 2337   RDW 17.7 (H) 11/15/2017 2337   LYMPHSABS 2.3 11/15/2017 2337   MONOABS 0.6 11/15/2017 2337   EOSABS 0.1 11/15/2017 2337   BASOSABS  0.1 11/15/2017 2337    Results for Manley MasonYOUNTS, Ericia G (MRN [id number]) as of 11/18/2017 08:34  Ref. Range 11/16/2017 21:51  Sample type Unknown ARTERIAL DRAW  Delivery systems Unknown BILEVEL POSITIVE AIRWAY PRESSURE  FIO2 Unknown 50.00  Inspiratory PAP Unknown 14  Expiratory PAP Unknown 5  pH, Arterial Latest Ref Range: 7.350 - 7.450  7.22 (L)  pCO2 arterial Latest Ref Range: 32.0 - 48.0 mmHg 120 (HH)  pO2, Arterial Latest Ref Range: 83.0 - 108.0 mmHg 92  Acid-Base Excess Latest Ref Range: 0.0 - 2.0 mmol/L PENDING  Bicarbonate Latest Ref Range: 20.0 - 28.0 mmol/L INCALC  O2 Saturation Latest Units: % INCALC  Patient temperature Unknown 37.0  Collection site Unknown LEFT RADIAL  Allens test (pass/fail) Latest Ref Range: PASS  PASS    CXR: No new film  IMPRESSION: Acute on chronic hypercapnic respiratory failure. Decompensated OHS/OSA Former smoker Documented history of COPD but no PFTs to confirm            I doubt current illness is due to AECOPD Mixed acid-base disturbance -chronic resp acidosis with metabolic compensation and primary metabolic alkalosis due to  loop diuresis Probable cor pulmonale Hypothyroidism - adequately treated  PLAN/REC:  Continue supplemental oxygen, carefully titrate to maintain SPO2 88-94% to avoid over oxygenation. Continue nebulized bronchodilators Avoid all systemic steroids Continue to monitor BMET closely I would like to see serum bicarb <40 Continue L-thyroxine, changed to p.o. today   PCCM will sign off. Please call if we can be of further assistance.  Dr. Sung AmabileSimonds has scheduled follow-up in pulmonary office in 3 to 4 weeks.   Wells Guileseep Uva Runkel, M.D., F.C.C.P.  Board Certified in Internal Medicine, Pulmonary Medicine, Critical Care Medicine, and Sleep Medicine.  [facility] Office Number: [phone]  11/18/2017 8:33 AM

## 2017-11-18 NOTE — Telephone Encounter (Signed)
-----   Message from [facility] Ahmad, [place] sent at 4/54/09818/16/2019  8:01 AM EDT ----- Please schedule pt for a hosp f/u appt in 3-4 with any available provider in a 30 min slot for hypoxia.  Thanks, Misty   ----- Message ----- From: Merwyn KatosSimonds, David B, MD Sent: 11/17/2017  12:15 PM EDT To: Renea EeMisty R Ahmad, LPN  Please arrange for post-hospital follow up in 3-4 weeks with any provider  Thanks   Theodoro Gristave

## 2017-11-18 NOTE — Care Management Important Message (Signed)
Important Message  Patient Details  Name: Manley Masonammy G Cansler MRN: [id number] Date of Birth: [birth year]   Medicare Important Message Given:  Yes    Olegario MessierKathy A Cloria Ciresi 11/18/2017, 11:05 AM

## 2017-11-18 NOTE — Progress Notes (Signed)
[facility] [place] [facility]   PATIENT NAME: Rebecca Bird    MR#:  [id number]  DATE OF BIRTH:  Apr 07, 1960  SUBJECTIVE:  CHIEF COMPLAINT:   Chief Complaint  Patient presents with  . Respiratory Distress   -On 4 L nasal cannula this morning, appears confused than baseline. -RN called later stating that her sats dropped into the 70s on 4 L nasal cannula.  REVIEW OF SYSTEMS:  Review of Systems  Constitutional: Positive for malaise/fatigue. Negative for chills and fever.  Respiratory: Positive for shortness of breath. Negative for cough and wheezing.   Cardiovascular: Positive for leg swelling. Negative for chest pain and palpitations.  Gastrointestinal: Negative for abdominal pain, constipation, diarrhea, nausea and vomiting.  Genitourinary: Negative for dysuria.  Musculoskeletal: Positive for myalgias.  Neurological: Negative for dizziness, focal weakness, seizures, weakness and headaches.  Psychiatric/Behavioral: Negative for depression, substance abuse and suicidal ideas.    DRUG ALLERGIES:   Allergies  Allergen Reactions  . Levaquin [Levofloxacin In D5w] Itching and Rash    VITALS:  Blood pressure (!) 99/51, pulse 89, temperature 97.6 F (36.4 C), temperature source Oral, resp. rate (!) 21, height 5\' 5"  (1.651 m), weight (!) 204.2 kg, SpO2 95 %.  PHYSICAL EXAMINATION:  Physical Exam  GENERAL:  57 y.o.-year-old morbidly obese patient lying in the bed with no acute distress.  EYES: Pupils equal, round, reactive to light and accommodation. No scleral icterus. Extraocular muscles intact.  HEENT: Head atraumatic, normocephalic. Oropharynx and nasopharynx clear.  NECK:  Supple, no jugular venous distention. No thyroid enlargement, no tenderness.  LUNGS: scant breath sounds bilaterally- decreased at the bases, minimal scattered wheezing,no  rales,rhonchi or crepitation. No use of accessory muscles of respiration.   CARDIOVASCULAR: S1, S2 normal. No  murmurs, rubs, or gallops.  ABDOMEN: Soft, obese, nontender, nondistended. Bowel sounds present. No organomegaly or mass.  EXTREMITIES: No pedal edema, cyanosis, or clubbing.  NEUROLOGIC: Cranial nerves II through XII are intact. Muscle strength 5/5 in all extremities. Sensation intact. Gait not checked. Global weakness noted. PSYCHIATRIC: The patient is alert and oriented x 1-2 Appears confused today  SKIN: No obvious rash, lesion, or ulcer.     LABORATORY PANEL:   CBC Recent Labs  Lab 11/15/17 2337  WBC 8.4  HGB 12.4  HCT 38.9  PLT 234   ------------------------------------------------------------------------------------------------------------------  Chemistries  Recent Labs  Lab 11/15/17 2337  11/17/17 0428 11/18/17 0508  NA 144   < > 144 143  K 4.7   < > 4.2 3.9  CL 85*   < > 86* 89*  CO2 49*   < > 47* 47*  GLUCOSE 134*   < > 113* 111*  BUN 33*   < > 31* 29*  CREATININE 0.73   < > 0.71 0.76  CALCIUM 8.8*   < > 8.8* 8.6*  MG  --   --  2.3  --   AST 26  --   --   --   ALT 33  --   --   --   ALKPHOS 83  --   --   --   BILITOT 1.1  --   --   --    < > = values in this interval not displayed.   ------------------------------------------------------------------------------------------------------------------  Cardiac Enzymes Recent Labs  Lab 11/15/17 2337  TROPONINI <0.03   ------------------------------------------------------------------------------------------------------------------  RADIOLOGY:  Dg Chest Port 1 View  Result Date: 11/18/2017 CLINICAL DATA:  Hypoxia. EXAM: PORTABLE CHEST 1 VIEW COMPARISON:  11/15/2017. FINDINGS: Cardiomegaly again noted. Diffuse interstitial prominence again noted. Interstitial prominence has increased slightly from prior exam. Findings suggest CHF. Similar findings noted on prior exam. Small bilateral pleural effusions cannot be excluded. No pneumothorax. IMPRESSION: Cardiomegaly with mild pulmonary interstitial prominence  again noted. Interstitial prominence has increased slightly from prior exam. Findings suggest CHF. Similar findings noted on prior exam. Small bilateral pleural effusions cannot be excluded. Electronically Signed   By: Maisie Fushomas  Register   On: 11/18/2017 12:38    EKG:   Orders placed or performed during the hospital encounter of 11/15/17  . ED EKG  . ED EKG  . EKG 12-Lead  . EKG 12-Lead    ASSESSMENT AND PLAN:   57 year old female with morbid obesity, diastolic CHF, COPD on 4 L home oxygen and sleep apnea presents to hospital secondary to shortness of breath and hypoxia  1.  Acute on chronic respiratory failure-secondary to COPD exacerbation and obesity hypoventilation -was on BiPAP in the ICU.  Weaned to 4 L nasal cannula which is chronic.  However altered mental status today and CO2 narcosis.  Will be transferred back to ICU for BiPAP. -Continue BiPAP at bedtime and during daytime while sleeping.  -  daily oral Lasix-recommend restarting Lasix. Per pulmonary avoid systemic steroids - for elevated serum bicarb-received acetazolamide x 2 doses -Appreciate pulmonary consult.  2.  Chronic diastolic CHF-enlarged heart and mild pulmonary vascular congestion.    IV Lasix to be given today's chest x-ray showing pulmonary vascular congestion and recommend daily doses.  3.  Anemia of chronic disease-stable.  No indication for transfusion  4.  Weakness-physical therapy consulted.  Patient and family very interested in long term placement.   -[facility].   -Social worker consulted.  5.  DVT prophylaxis-Lovenox  6.  Right shoulder dislocation-[facility] at [facility], failed attempts to relocate the displacement.  Recommended outpatient follow-up for possible reconstruction surgery.   Consulted palliative care team Long-term poor prognosis   All the records are reviewed and case discussed with Care Management/Social Workerr. Management plans  discussed with the patient, family and they are in agreement.  CODE STATUS:  Full Code  TOTAL TIME TAKING CARE OF THIS PATIENT: 36 minutes.   POSSIBLE D/C IN 2 DAYS, DEPENDING ON CLINICAL CONDITION.   Enid BaasKALISETTI,Mikita Lesmeister M.D on 11/18/2017 at 12:53 PM  Between 7am to 6pm - Pager - [phone]  After 6pm go to [email or web address] - Social research officer, governmentpassword EPAS [facility]  [facility]  Office  [phone]  CC: Primary care physician; Dinges, Roanna BanningMarie Moore, NP

## 2017-11-18 NOTE — [place] FL2 (Signed)
[place] MEDICAID FL2 LEVEL OF CARE SCREENING TOOL     IDENTIFICATION  Patient Name: Rebecca Bird Birthdate: 12-18-1960 Sex: female Admission Date (Current Location): 11/15/2017  [place]   Facility and Address:  [facility], [address], [place], [place] [address]      Provider Number: [id number]  Attending Physician Name and Address:  Enid BaasKalisetti, Radhika, MD  Relative Name and Phone Number:       Current Level of Care: Hospital Recommended Level of Care: Skilled Nursing Facility Prior Approval Number:    Date Approved/Denied:   PASRR Number: ([id number])  Discharge Plan: SNF    Current Diagnoses: Patient Active Problem List   Diagnosis Date Noted  . Acute on chronic respiratory failure with hypoxia and hypercapnia (HCC) 11/16/2017  . Acute on chronic respiratory failure with hypoxemia (HCC) 11/01/2017    Orientation RESPIRATION BLADDER Height & Weight     Self, Time, Situation, Place  O2(4 Liters Oxygen. ) Incontinent Weight: (!) 450 lb 2.9 oz (204.2 kg) Height:  5\' 6"  (167.6 cm)  BEHAVIORAL SYMPTOMS/MOOD NEUROLOGICAL BOWEL NUTRITION STATUS      Continent Diet(Diet: Heart Healthy/ Carb Modified. )  AMBULATORY STATUS COMMUNICATION OF NEEDS Skin   Extensive Assist Verbally Normal                       Personal Care Assistance Level of Assistance  Bathing, Dressing, Feeding Bathing Assistance: Maximum assistance Feeding assistance: Maximum assistance Dressing Assistance: Maximum assistance     Functional Limitations Info  Sight, Hearing, Speech Sight Info: Adequate Hearing Info: Adequate Speech Info: Adequate    SPECIAL CARE FACTORS FREQUENCY  PT (By licensed PT), OT (By licensed OT)     PT Frequency: (5) OT Frequency: (5)            Contractures      Additional Factors Info  Code Status, Allergies Code Status Info: (Full Code. ) Allergies Info: (Levaquin Levofloxacin In  D5w)           Current Medications (11/18/2017):  This is the current hospital active medication list Current Facility-Administered Medications  Medication Dose Route Frequency Provider Last Rate Last Dose  . 0.9 %  sodium chloride infusion   Intravenous PRN Eugenie NorrieBlakeney, Dana G, NP      . acetaminophen (TYLENOL) tablet 650 mg  650 mg Oral Q6H PRN Arnaldo Nataliamond, Michael S, MD       Or  . acetaminophen (TYLENOL) suppository 650 mg  650 mg Rectal Q6H PRN Arnaldo Nataliamond, Michael S, MD      . carvedilol (COREG) tablet 3.125 mg  3.125 mg Oral BID WC Arnaldo Nataliamond, Michael S, MD   3.125 mg at 11/17/17 0826  . chlorhexidine (PERIDEX) 0.12 % solution 15 mL  15 mL Mouth Rinse BID Eugenie NorrieBlakeney, Dana G, NP   15 mL at 11/17/17 2221  . citalopram (CELEXA) tablet 40 mg  40 mg Oral Daily Arnaldo Nataliamond, Michael S, MD   40 mg at 11/18/17 0851  . docusate sodium (COLACE) capsule 100 mg  100 mg Oral BID Arnaldo Nataliamond, Michael S, MD   100 mg at 11/18/17 0851  . enoxaparin (LOVENOX) injection 40 mg  40 mg Subcutaneous Q12H Kalisetti, Radhika, MD      . ferrous sulfate tablet 325 mg  325 mg Oral Q breakfast Arnaldo Nataliamond, Michael S, MD   325 mg at 11/18/17 [id number]  . insulin aspart (novoLOG) injection 0-15 Units  0-15 Units Subcutaneous TID WC  Merwyn KatosSimonds, David B, MD   2 Units at 11/18/17 1336  . insulin aspart (novoLOG) injection 0-5 Units  0-5 Units Subcutaneous QHS Merwyn KatosSimonds, David B, MD      . ipratropium-albuterol (DUONEB) 0.5-2.5 (3) MG/3ML nebulizer solution 3 mL  3 mL Nebulization Q6H Eugenie NorrieBlakeney, Dana G, NP   3 mL at 11/18/17 1312  . levothyroxine (SYNTHROID, LEVOTHROID) tablet 100 mcg  100 mcg Oral QAC breakfast Merwyn KatosSimonds, David B, MD   100 mcg at 11/18/17 0711  . MEDLINE mouth rinse  15 mL Mouth Rinse q12n4p Eugenie NorrieBlakeney, Dana G, NP   15 mL at 11/18/17 1336  . ondansetron (ZOFRAN) injection 4 mg  4 mg Intravenous Q6H PRN Arnaldo Nataliamond, Michael S, MD      . pneumococcal 23 valent vaccine (PNU-IMMUNE) injection 0.5 mL  0.5 mL Intramuscular Tomorrow-1000 Elliott, Sabrina  A, RN      . polyethylene glycol (MIRALAX / GLYCOLAX) packet 17 g  17 g Oral Daily Enid BaasKalisetti, Radhika, MD   17 g at 11/18/17 0851  . potassium chloride SA (K-DUR,KLOR-CON) CR tablet 40 mEq  40 mEq Oral Daily Merwyn KatosSimonds, David B, MD   40 mEq at 11/18/17 [id number]  . traMADol (ULTRAM) tablet 50 mg  50 mg Oral Q6H PRN Arnaldo Nataliamond, Michael S, MD   50 mg at 11/18/17 [id number]     Discharge Medications: Please see discharge summary for a list of discharge medications.  Relevant Imaging Results:  Relevant Lab Results:   Additional Information (SSN: [id number])  Kenisha Lynds, Darleen CrockerBailey M, LCSW

## 2017-11-18 NOTE — Progress Notes (Signed)
Patient is being transferred to stepdown. O2 sats low without bipap. PCO2 114.

## 2017-11-18 NOTE — Clinical Social Work Note (Signed)
Clinical Social Work Assessment  Patient Details  Name: Rebecca Bird MRN: [id number] Date of Birth: 1960-04-27  Date of referral:  11/18/17               Reason for consult:  Other (Comment Required)(From [facility] )                Permission sought to share information with:  Facility Art therapist granted to share information::  Yes, Verbal Permission Granted  Name::        Agency::     Relationship::     Contact Information:     Housing/Transportation Living arrangements for the past 2 months:  [place] of Information:  Patient Patient Interpreter Needed:  None Criminal Activity/Legal Involvement Pertinent to Current Situation/Hospitalization:  No - Comment as needed Significant Relationships:  Adult Children Lives with:  Facility Resident Do you feel safe going back to the place where you live?  Yes Need for family participation in patient care:  Yes (Comment)  Care giving concerns:  Patient is a short term rehab resident at [facility].    Social Worker assessment / plan:  Holiday representative ([place]) reviewed chart and noted that patient is from [facility]. Per [facility] admissions coordinator at [facility] patient is there for short term rehab and can return when medically stable. Per [facility] Billings patient has an air mattress, bariatric bed and bi-pap at [facility]. CSW met with patient alone at bedside on 1A before she transferred back to ICU. Patient was alert and oriented X3 and was laying in the bed. CSW introduced self and explained role of CSW department. Per patient she has been at [facility] for rehab and would like to return there. Patient requested a private room. CSW made patient aware that a private room can be requested however [facility] may not have one available. Patient verbalized her understanding. Per Claiborne Billings they do not have any private rooms available right  now. FL2 complete. CSW will continue to follow and assist as needed.      Employment status:  Disabled (Comment on whether or not currently receiving Disability) Insurance information:  Medicare PT Recommendations:  [place] / Referral to community resources:  [place]  Patient/Family's Response to care:  Patient is agreeable to return to [facility].   Patient/Family's Understanding of and Emotional Response to Diagnosis, Current Treatment, and Prognosis:  Patient was very pleasant and thanked CSW for assistance.   Emotional Assessment Appearance:  Appears stated age Attitude/Demeanor/Rapport:    Affect (typically observed):  Accepting, Adaptable, Pleasant Orientation:  Oriented to Self, Oriented to Place, Oriented to  Time, Oriented to Situation Alcohol / Substance use:  Not Applicable Psych involvement (Current and /or in the community):  No (Comment)  Discharge Needs  Concerns to be addressed:  Discharge Planning Concerns Readmission within the last 30 days:  No Current discharge risk:  Dependent with Mobility Barriers to Discharge:  Continued Medical Work up   [employer], Veronia Beets, LCSW 11/18/2017, 1:57 PM

## 2017-11-18 NOTE — Progress Notes (Signed)
PT Cancellation Note  Patient Details Name: Rebecca Bird MRN: [id number] DOB: 1960-10-17   Cancelled Treatment:     Pt was sent to CCU secondary to respiratory issues.  Secondary to change in status she will need new PT orders when medically appropriate.  Current orders will be completed.   Malachi ProGalen R Torris House, DPT 11/18/2017, 4:10 PM

## 2017-11-18 NOTE — Consult Note (Signed)
Consultation Note Date: 11/18/2017   Patient Name: Rebecca Bird  DOB: 1960/10/20  MRN: [id number]  Age / Sex: 57 y.o., female  PCP: Dinges, John Giovanni, NP Referring Physician: Gladstone Lighter, MD  Reason for Consultation: Establishing goals of care  HPI/Patient Profile: 57 y.o. female  with past medical history of CHF, COPD, OSA/OHS, former smoker, on 4 L O2 at home, and hypothyroidism admitted on 11/15/2017 with respiratory distress. CO2 found to be 120 on ABG. Has required bipap during admission. Noted patient's history of multiple hospitalizations recently. PMT consulted for [place].  Clinical Assessment and Goals of Care: I have reviewed medical records including EPIC notes, labs and imaging, received report from Dr. Tressia Miners, assessed the patient and then met at the bedside with patient  to discuss diagnosis prognosis, [place], EOL wishes, disposition and options.  I introduced Palliative Medicine as specialized medical care for people living with serious illness. It focuses on providing relief from the symptoms and stress of a serious illness. The goal is to improve quality of life for both the patient and the family.  Conversation was limited d/t to patient's worsening lethargy and slight confusion as conversation progressed.   As far as functional and nutritional status, she tells she requires assistance with ADLs such as bathing. Also, shares she requires a walker for ambulation. Tells me she has experienced worsening appetite recently.     We discussed their current illness and what it means in the larger context of their on-going co-morbidities.   Advanced directives, concepts specific to code status, artifical feeding and hydration, and rehospitalization were considered and discussed. Patient tells me she does not want "life support" but would want to be intubated and would want a trach if necessary. I attempted to clarify confusion;  however, patient drifts off to sleep during conversation. When asked about CPR she tells me "a few cycles". She tells me she has had this conversation before and her family is aware of her wishes. Confirms that daughter, [name], is HCPOA followed by son, Coralyn Mark.   Unsure of patient's decision making ability at this time.   Notified Dr. Tressia Miners of patient's mental status and difficulty of conversation.   Attempted to speak with patient's daughter - no answer, left voicemail.  Since conversation, ABG was checked and patient moved to ICU for bipap.  Primary Decision Maker PATIENT Daughter, [name] is documented HCPOA if patient unable  SUMMARY OF RECOMMENDATIONS   Full code/full scope PMT to continue to follow Will make continued attempts to speak with daughter At minimum, patient should be followed by palliative outpatient  Code Status/Advance Care Planning:  Full code   Symptom Management:   Per primary  Prognosis:   Unable to determine  Discharge Planning: [place] for rehab with Palliative care service follow-up ??     Primary Diagnoses: Present on Admission: . Acute on chronic respiratory failure with hypoxia and hypercapnia (HCC)   I have reviewed the medical record, interviewed the patient and family, and examined the patient. The following aspects are pertinent.  Past Medical History:  Diagnosis Date  . Anemia   . CHF (congestive heart failure) ([place])   . COPD (chronic obstructive pulmonary disease) ([place])   . Depression   . Hypertension   . Sleep apnea   . Thyroid disease    Social History   Socioeconomic History  . Marital status: Widowed    Spouse name: Not on file  . Number of children: Not on file  . Years  of education: Not on file  . Highest education level: Not on file  Occupational History  . Not on file  Social Needs  . Financial resource strain: Not on file  . Food insecurity:    Worry: Not on file    Inability: Not  on file  . Transportation needs:    Medical: Not on file    Non-medical: Not on file  Tobacco Use  . Smoking status: Former Research scientist (life sciences)  . Smokeless tobacco: Never Used  Substance and Sexual Activity  . Alcohol use: Not on file  . Drug use: Not on file  . Sexual activity: Not on file  Lifestyle  . Physical activity:    Days per week: Not on file    Minutes per session: Not on file  . Stress: Not on file  Relationships  . Social connections:    Talks on phone: Not on file    Gets together: Not on file    Attends religious service: Not on file    Active member of club or organization: Not on file    Attends meetings of clubs or organizations: Not on file    Relationship status: Not on file  Other Topics Concern  . Not on file  Social History Narrative  . Not on file   No family history on file. Scheduled Meds: . carvedilol  3.125 mg Oral BID WC  . chlorhexidine  15 mL Mouth Rinse BID  . citalopram  40 mg Oral Daily  . docusate sodium  100 mg Oral BID  . enoxaparin (LOVENOX) injection  40 mg Subcutaneous Q12H  . ferrous sulfate  325 mg Oral Q breakfast  . insulin aspart  0-15 Units Subcutaneous TID WC  . insulin aspart  0-5 Units Subcutaneous QHS  . ipratropium-albuterol  3 mL Nebulization Q6H  . levothyroxine  100 mcg Oral QAC breakfast  . mouth rinse  15 mL Mouth Rinse q12n4p  . pneumococcal 23 valent vaccine  0.5 mL Intramuscular Tomorrow-1000  . polyethylene glycol  17 g Oral Daily  . potassium chloride  40 mEq Oral Daily   Continuous Infusions: . sodium chloride     PRN Meds:.sodium chloride, acetaminophen **OR** acetaminophen, [DISCONTINUED] ondansetron **OR** ondansetron (ZOFRAN) IV, traMADol Allergies  Allergen Reactions  . Levaquin [Levofloxacin In D5w] Itching and Rash   Review of Systems  Constitutional: Positive for activity change, appetite change and fatigue.  Respiratory: Negative for shortness of breath.     Physical Exam  Constitutional: No  distress.  HENT:  Head: Normocephalic and atraumatic.  Cardiovascular: Normal rate and regular rhythm.  Pulmonary/Chest: Effort normal and breath sounds normal. No respiratory distress.  Abdominal: Soft.  Neurological:  Drifts off to sleep during conversation, unsure of location   Skin: Skin is warm and dry. She is not diaphoretic.    Vital Signs: BP (!) 99/51 (BP Location: Left Arm)   Pulse 89   Temp 97.6 F (36.4 C) (Oral)   Resp (!) 21   Ht 5' 5"  (1.651 m)   Wt (!) 204.2 kg   SpO2 95%   BMI 74.91 kg/m  Pain Scale: 0-10   Pain Score: 8    SpO2: SpO2: 95 % O2 Device:SpO2: 95 % O2 Flow Rate: .O2 Flow Rate (L/min): 4 L/min  IO: Intake/output summary:   Intake/Output Summary (Last 24 hours) at 11/18/2017 1044 Last data filed at 11/18/2017 1036 Gross per 24 hour  Intake 600 ml  Output 1475 ml  Net -875 ml  LBM: Last BM Date: 11/15/17 Baseline Weight: Weight: (!) 208.5 kg Most recent weight: Weight: (!) 204.2 kg     Palliative Assessment/Data: PPS 40%    Time Total: 40 minutes Greater than 50%  of this time was spent counseling and coordinating care related to the above assessment and plan.  Juel Burrow, DNP, AGNP-C Palliative Medicine Team [phone] Pager: [phone]

## 2017-11-18 NOTE — Care Management (Signed)
Transferred out of icu to 1A 11/18/2017. On 4 liters nasal cannula. Lasix on hold.  PT consult is pending

## 2017-11-18 NOTE — Consult Note (Signed)
Name: Rebecca Bird MRN: [id number] DOB: [birth year]    ADMISSION DATE:  11/15/2017 CONSULTATION DATE:  11/18/17  REFERRING MD : Dr. Nemiah Commander  CHIEF COMPLAINT:  AMS/Confusion; Acute on Chronic Hypoxic Hypercarbic Respiratory Failure  BRIEF PATIENT DESCRIPTION:  57 y.o. Female admitted with Acute on Chronic Hypoxic Hypercarbic Respiratory Failure in setting of OHS/OSA.  She transferred out to Med/surg unit 11/17/17 on 4L [place].  On 11/18/17, pt with AMS/confusion and acute decompensation of Acute on Chronic Hypoxic Hypercarbic Respiratory Failure, requiring transfer to ICU for BiPAP.  SIGNIFICANT EVENTS  11/15/17>>Admission to [facility] Stepdown unit 11/17/17>>Transfer to [facility] Med/surg unit 11/18/17>> AMS, acute decompensation requiring transfer to ICU for BiPAP  STUDIES:  CXR 11/15/17>> Cardiomegaly with vascular congestion. Asymmetric opacity in the right thorax may reflect asymmetric edema. Findings do not appear significantly changed as compared with 11/02/2017. CXR 11/18/17>> Cardiomegaly with mild pulmonary interstitial prominence again noted. Interstitial prominence has increased slightly from prior exam. Findings suggest CHF. Similar findings noted on prior exam. Small bilateral pleural effusions cannot be excluded.   HISTORY OF PRESENT ILLNESS:   Rebecca Bird is a 57 year old female past medical history as listed below, who initially presented to [facility] ED on 11/15/17 complaints of shortness of breath.  She was noted to be hypoxic in the ED, and was placed on BiPAP.  Chest x-ray revealed pulmonary edema, BNP 193, and VBG pH 7.34/CO2 113. She was admitted to the stepdown unit for further work-up and treatment of acute on chronic hypoxic hypercapnic respiratory failure secondary to OSA/OHS, and AECOPD.  She was able to be transitioned off BiPAP to 4L [place], and transferred out to Med/Surg unit on 8/15. It is reported by respiratory therapy that pt did wear BiPAP overnight 8/15, then was placed on  during  the day. In the am 8/16, she was noted to be have AMS (confusion and lethargy).  ABG was obtained (pH 7.28/ CO2 114/ pO2 43/ Bicarb 53.6).  She is subsequently being transferred back to ICU for Acute on Chronic Hypoxic Hypercarbic Respiratory Failure requiring BiPAP.  PAST MEDICAL HISTORY :   has a past medical history of Anemia, CHF (congestive heart failure) (HCC), COPD (chronic obstructive pulmonary disease) (HCC), Depression, Hypertension, Sleep apnea, and Thyroid disease.  has no past surgical history on file. Prior to Admission medications   Medication Sig Start Date End Date Taking? Authorizing Provider  acetaminophen (TYLENOL) 500 MG tablet Take 500 mg by mouth every 8 (eight) hours as needed for mild pain.    Yes [provider]  albuterol (PROVENTIL HFA;VENTOLIN HFA) 108 (90 Base) MCG/ACT inhaler Inhale 2 puffs into the lungs every 4 (four) hours as needed for shortness of breath.    Yes [provider]  carvedilol (COREG) 3.125 MG tablet Take 3.125 mg by mouth 2 (two) times daily with a meal.   Yes [provider]  citalopram (CELEXA) 40 MG tablet Take 40 mg by mouth daily.    Yes [provider]  diclofenac sodium (VOLTAREN) 1 % GEL Apply 4 g topically 4 (four) times daily. Apply to right shoulder   Yes [provider]  docusate sodium (COLACE) 100 MG capsule Take 1 capsule (100 mg total) by mouth 2 (two) times daily. 11/05/17  Yes Enid Baas, MD  ferrous sulfate 325 (65 FE) MG tablet Take 325 mg by mouth daily with breakfast.   Yes [provider]  furosemide (LASIX) 40 MG tablet Take 120 mg by mouth daily.    Yes [provider]  ipratropium-albuterol (DUONEB) 0.5-2.5 (3) MG/3ML SOLN Take 3 mLs by nebulization every 6 (six) hours as needed (shortness of breath). 11/05/17  Yes Enid BaasKalisetti, Radhika, MD  levothyroxine (SYNTHROID, LEVOTHROID) 100 MCG tablet Take 1 tablet (100 mcg total) by mouth daily before breakfast. 11/06/17   Yes Enid BaasKalisetti, Radhika, MD  potassium chloride (K-DUR) 10 MEQ tablet Take 10 mEq by mouth daily.    Yes [provider]  predniSONE (DELTASONE) 20 MG tablet Take 20 mg by mouth daily with breakfast. 11/14/17 11/23/17 Yes [provider]  traMADol (ULTRAM) 50 MG tablet Take 1 tablet (50 mg total) by mouth every 6 (six) hours as needed for moderate pain. 11/05/17  Yes Enid BaasKalisetti, Radhika, MD   Allergies  Allergen Reactions  . Levaquin [Levofloxacin In D5w] Itching and Rash    FAMILY HISTORY:  family history is not on file. SOCIAL HISTORY:  reports that she has quit smoking. She has never used smokeless tobacco.  REVIEW OF SYSTEMS:  Positives in BOLD Constitutional: Negative for fever, chills, weight loss, malaise/fatigue and diaphoresis.  HENT: Negative for hearing loss, ear pain, nosebleeds, congestion, sore throat, neck pain, tinnitus and ear discharge.   Eyes: Negative for blurred vision, double vision, photophobia, pain, discharge and redness.  Respiratory: Negative for cough, hemoptysis, sputum production, +shortness of breath, wheezing and stridor.   Cardiovascular: Negative for chest pain, palpitations, orthopnea, claudication, leg swelling and PND.  Gastrointestinal: Negative for heartburn, nausea, vomiting, abdominal pain, diarrhea, constipation, blood in stool and melena.  Genitourinary: Negative for dysuria, urgency, frequency, hematuria and flank pain.  Musculoskeletal: Negative for myalgias, back pain, joint pain and falls.  Skin: Negative for itching and rash.  Neurological: Negative for dizziness, tingling, tremors, sensory change, speech change, focal weakness, seizures, loss of consciousness, weakness and headaches.  Endo/Heme/Allergies: Negative for environmental allergies and polydipsia. Does not bruise/bleed easily.  SUBJECTIVE:  Confused this morning 11/18/17 Transferred back to ICU for BiPAP Lethargic, arouses to voice and follows  commands Afebrile  VITAL SIGNS: Temp:  [97.6 F (36.4 C)-98.8 F (37.1 C)] 97.6 F (36.4 C) (08/16 0752) Pulse Rate:  [85-104] 89 (08/16 0752) Resp:  [16-27] 21 (08/16 0044) BP: (95-123)/(47-82) 99/51 (08/16 0752) SpO2:  [87 %-99 %] 95 % (08/16 0752) FiO2 (%):  [40 %-50 %] 40 % (08/16 0218)  PHYSICAL EXAMINATION: General:  Acute on chronically ill appearing female, laying in bed, on BiPAP, in NAD Neuro:  Lethargic, arouses to voice, follows commands (weakness noted in RUE which is reported baseline), Pupils PERRL 3 mm  HEENT:  Atraumatic, normocephalic, neck supple, No JVD Cardiovascular:  Tachycardia, RRR, no M/R/G Lungs:  Diminished throughout, no wheezing, BiPAP assisted, even, normal effort Abdomen:  Obese, soft, nontender, BS distant Musculoskeletal:  No deformities Skin:  No obvious  Rashes, lesions, or uclerations  Recent Labs  Lab 11/16/17 2140 11/17/17 0428 11/18/17 0508  NA 144 144 143  K 4.3 4.2 3.9  CL 86* 86* 89*  CO2 48* 47* 47*  BUN 31* 31* 29*  CREATININE 0.70 0.71 0.76  GLUCOSE 148* 113* 111*   Recent Labs  Lab 11/15/17 2337  HGB 12.4  HCT 38.9  WBC 8.4  PLT 234   No results found.  ASSESSMENT / PLAN:  A: Acute on Chronic Hypoxic Hypercarbic Respiratory failure in the setting of Decompensated OHS/OSA, ?CHF exacerbation -ABG 8/16>> 7.28/114/43/53.6 Hx COPD Former smoker Mixed acid-base disturbance - Acute Respiratory acidosis with metabolic compensation Hypothyroidism - adequately treated P: BiPAP Follow intermittent ABG and  CXR Maintain O2 sats 88 to 94% Continue Bronchodilators Will give 40 mg Lasix x1 dose 8/16 Will hold off on steroids for now Follow BMP Obtain CBC Obtain PCT Continue Synthroid Palliative care consulted, appreciate input  Updates:  No family present at bedside 8/16 during NP rounds Disposition: ICU Goals of care: Full Code VTE Prophylaxis: Lovenox SQ   Harlon DittyJeremiah Keene, AGACNP-BC [facility] Pager: [phone]   11/18/2017, 11:58 AM

## 2017-11-18 NOTE — Progress Notes (Signed)
PT Cancellation Note  Patient Details Name: Rebecca Bird MRN: [id number] DOB: Feb 17, 1961   Cancelled Treatment:     Chart reviewed. Upon arrival pt confused and only disoriented to place, time, and situation. O2 checked at beginning of session with pulse ox and dynamap showing that pts oxygen saturation 61% on 4L via [place], pts breathing appeared to be very labored which according to RN is baseline. Oxygen increased to 6L RN notified and immediately came to room to assume primary care. PT student stayed to assist RN encouraging pt to perform deep breathing. RN called respiratory therapist who informed RN to leave pt on 6L. Over several minutes pts O2 elevated to the mid 80s which respiratory therapist states is target zone for pt. With minimal conversational talking pts O2% dropped back to the mid 70s indicating that pt would likely not tolerate activity at this time, therefore treatment was held. Pt left on 6L via [place] with oxygen saturation in the mid 80s. Pt left in the direct care of the RN Rebecca Bird.  Vestal Markin Elnora Morrisonhaffin, SPT   Zuzu Befort 11/18/2017, 11:10 AM

## 2017-11-18 NOTE — Progress Notes (Signed)
Report given to Alexi in stepdown. This nurse will call patient's daughter Duwayne HeckDanielle.

## 2017-11-18 NOTE — Plan of Care (Signed)
  Problem: Clinical Measurements: Goal: Ability to maintain clinical measurements within normal limits will improve Outcome: Progressing Goal: Will remain free from infection Outcome: Progressing   Problem: Elimination: Goal: Will not experience complications related to bowel motility Outcome: Progressing   Problem: Skin Integrity: Goal: Risk for impaired skin integrity will decrease Outcome: Progressing   

## 2017-11-18 NOTE — Telephone Encounter (Signed)
LMOV to schedule HFU

## 2017-11-19 LAB — BASIC METABOLIC PANEL
Anion gap: 8 (ref 5–15)
BUN: 24 mg/dL — ABNORMAL HIGH (ref 6–20)
CALCIUM: 8.8 mg/dL — AB (ref 8.9–10.3)
CHLORIDE: 89 mmol/L — AB (ref 98–111)
CO2: 47 mmol/L — AB (ref 22–32)
CREATININE: 0.56 mg/dL (ref 0.44–1.00)
GFR calc Af Amer: >60 mL/min (ref >60)
GFR calc non Af Amer: >60 mL/min (ref >60)
GLUCOSE: 109 mg/dL — AB (ref 70–99)
Potassium: 4 mmol/L (ref 3.5–5.1)
Sodium: 144 mmol/L (ref 135–145)

## 2017-11-19 LAB — CBC
HEMATOCRIT: 35.5 % (ref 35.0–47.0)
HEMOGLOBIN: 11.3 g/dL — AB (ref 12.0–16.0)
MCH: 32.8 pg (ref 26.0–34.0)
MCHC: 31.7 g/dL — AB (ref 32.0–36.0)
MCV: 103.2 fL — ABNORMAL HIGH (ref 80.0–100.0)
Platelets: 139 10*3/uL — ABNORMAL LOW (ref 150–440)
RBC: 3.44 MIL/uL — ABNORMAL LOW (ref 3.80–5.20)
RDW: 18.1 % — AB (ref 11.5–14.5)
WBC: 6.4 10*3/uL (ref 3.6–11.0)

## 2017-11-19 LAB — MAGNESIUM: Magnesium: 2.1 mg/dL (ref 1.7–2.4)

## 2017-11-19 LAB — GLUCOSE, CAPILLARY
Glucose-Capillary: 97 mg/dL (ref 70–99)
Glucose-Capillary: 109 mg/dL — ABNORMAL HIGH (ref 70–99)
Glucose-Capillary: 113 mg/dL — ABNORMAL HIGH (ref 70–99)
Glucose-Capillary: 120 mg/dL — ABNORMAL HIGH (ref 70–99)

## 2017-11-19 MED ORDER — FUROSEMIDE 10 MG/ML IJ SOLN
40.0000 mg | Freq: Two times a day (BID) | INTRAMUSCULAR | Status: DC
  Administered 2017-11-19 – 2017-11-22 (×8): 40 mg via INTRAVENOUS
  Filled 2017-11-19 (×8): qty 4

## 2017-11-19 NOTE — Progress Notes (Signed)
[facility] [place] [facility]   PATIENT NAME: Rebecca Bird    MR#:  [id number]  DATE OF BIRTH:  Oct 17, 1960  SUBJECTIVE:  CHIEF COMPLAINT:   Chief Complaint  Patient presents with  . Respiratory Distress   - transferred to stepdown yesterday due to hypoxia and confusion- on Bipap -remains on BiPAP this morning.  REVIEW OF SYSTEMS:  Review of Systems  Constitutional: Positive for malaise/fatigue. Negative for chills and fever.  Respiratory: Positive for shortness of breath. Negative for cough and wheezing.   Cardiovascular: Positive for leg swelling. Negative for chest pain and palpitations.  Gastrointestinal: Negative for abdominal pain, constipation, diarrhea, nausea and vomiting.  Genitourinary: Negative for dysuria.  Musculoskeletal: Positive for myalgias.  Neurological: Negative for dizziness, focal weakness, seizures, weakness and headaches.  Psychiatric/Behavioral: Negative for depression, substance abuse and suicidal ideas.    DRUG ALLERGIES:   Allergies  Allergen Reactions  . Levaquin [Levofloxacin In D5w] Itching and Rash    VITALS:  Blood pressure (!) 108/57, pulse 88, temperature 98.9 F (37.2 C), temperature source Axillary, resp. rate 14, height 5\' 6"  (1.676 m), weight (!) 204.2 kg, SpO2 95 %.  PHYSICAL EXAMINATION:  Physical Exam  GENERAL:  57 y.o.-year-old morbidly obese patient lying in the bed with no acute distress.  EYES: Pupils equal, round, reactive to light and accommodation. No scleral icterus. Extraocular muscles intact.  HEENT: Head atraumatic, normocephalic. Oropharynx and nasopharynx clear.  NECK:  Supple, no jugular venous distention. No thyroid enlargement, no tenderness.  LUNGS: scant breath sounds bilaterally- decreased at the bases, minimal scattered wheezing,no  rales,rhonchi or crepitation. No use of accessory muscles of respiration.   CARDIOVASCULAR: S1, S2 normal. No murmurs, rubs, or gallops.  ABDOMEN: Soft,  obese, nontender, nondistended. Bowel sounds present. No organomegaly or mass.  EXTREMITIES: No pedal edema, cyanosis, or clubbing.  NEUROLOGIC: Cranial nerves II through XII are intact. Muscle strength 5/5 in all extremities. Sensation intact. Gait not checked. Global weakness noted. PSYCHIATRIC: The patient is alert and Appears confused and on Bipap SKIN: No obvious rash, lesion, or ulcer.     LABORATORY PANEL:   CBC Recent Labs  Lab 11/19/17 0502  WBC 6.4  HGB 11.3*  HCT 35.5  PLT 139*   ------------------------------------------------------------------------------------------------------------------  Chemistries  Recent Labs  Lab 11/15/17 2337  11/19/17 0502  NA 144   < > 144  K 4.7   < > 4.0  CL 85*   < > 89*  CO2 49*   < > 47*  GLUCOSE 134*   < > 109*  BUN 33*   < > 24*  CREATININE 0.73   < > 0.56  CALCIUM 8.8*   < > 8.8*  MG  --    < > 2.1  AST 26  --   --   ALT 33  --   --   ALKPHOS 83  --   --   BILITOT 1.1  --   --    < > = values in this interval not displayed.   ------------------------------------------------------------------------------------------------------------------  Cardiac Enzymes Recent Labs  Lab 11/15/17 2337  TROPONINI <0.03   ------------------------------------------------------------------------------------------------------------------  RADIOLOGY:  Dg Chest Port 1 View  Result Date: 11/18/2017 CLINICAL DATA:  Hypoxia. EXAM: PORTABLE CHEST 1 VIEW COMPARISON:  11/15/2017. FINDINGS: Cardiomegaly again noted. Diffuse interstitial prominence again noted. Interstitial prominence has increased slightly from prior exam. Findings suggest CHF. Similar findings noted on prior exam. Small bilateral pleural effusions cannot be excluded. No pneumothorax.  IMPRESSION: Cardiomegaly with mild pulmonary interstitial prominence again noted. Interstitial prominence has increased slightly from prior exam. Findings suggest CHF. Similar findings noted on  prior exam. Small bilateral pleural effusions cannot be excluded. Electronically Signed   By: Maisie Fushomas  Register   On: 11/18/2017 12:38    EKG:   Orders placed or performed during the hospital encounter of 11/15/17  . ED EKG  . ED EKG  . EKG 12-Lead  . EKG 12-Lead    ASSESSMENT AND PLAN:   57 year old female with morbid obesity, diastolic CHF, COPD on 4 L home oxygen and sleep apnea presents to hospital secondary to shortness of breath and hypoxia  1.  Acute on chronic respiratory failure-secondary to COPD exacerbation and obesity hypoventilation -was on BiPAP in the ICU.  Weaned to 4 L nasal cannula which is chronic.  However altered mental status today and CO2 narcosis.  Will be transferred back to ICU for BiPAP. -Continue BiPAP at bedtime and during daytime while sleeping.  -  daily oral Lasix-recommend restarting Lasix. Per pulmonary avoid systemic steroids -Appreciate pulmonary consult.  2.  Chronic diastolic CHF-enlarged heart and mild pulmonary vascular congestion.    IV Lasix recommended chest x-ray showing pulmonary vascular congestion and recommend daily doses.  3.  Anemia of chronic disease-stable.  No indication for transfusion yet  4.  Weakness-physical therapy consulted.  Patient and family very interested in long term placement.   -[facility] is [facility] at [facility].   -Social worker consulted.  5.  DVT prophylaxis-Lovenox  6.  Right shoulder dislocation-seen at [facility], failed attempts to relocate the displacement.  Recommended outpatient follow-up for possible reconstruction surgery.   Consulted palliative care team Long-term poor prognosis   All the records are reviewed and case discussed with Care Management/Social Workerr. Management plans discussed with the patient, family and they are in agreement.  CODE STATUS:  Full Code  TOTAL TIME TAKING CARE OF THIS PATIENT: 32 minutes.   POSSIBLE D/C IN 2 DAYS, DEPENDING ON CLINICAL  CONDITION.   Enid BaasKALISETTI,Jonnae Fonseca M.D on 11/19/2017 at 10:33 AM  Between 7am to 6pm - Pager - [phone]  After 6pm go to [email or web address] - Social research officer, governmentpassword [email or web address] [email or web address]  [facility]  Office  [phone]  CC: Primary care physician; Dinges, Roanna BanningMarie Moore, NP

## 2017-11-19 NOTE — Progress Notes (Signed)
Follow up - Critical Care Medicine Note  Patient Details:    Rebecca Bird is an 57 y.o. female.admittedwith Acute on Chronic Hypoxic Hypercarbic Respiratory Failure in setting of OHS/OSA. SheManley Bird transferred out to Med/surg unit 11/17/17 on 4L [place]. On 11/18/17, pt with AMS/confusionand acute decompensation of Acute on Chronic Hypoxic Hypercarbic Respiratory Failure, requiring transfer to ICU for BiPAP.  Lines, Airways, Drains: External Urinary Catheter (Active)  Collection Container Dedicated Suction Canister 11/18/2017  8:00 PM  Securement Method Other (Comment) 11/18/2017  8:00 PM  Intervention Equipment Changed 11/18/2017 12:46 PM  Output (mL) 200 mL 11/19/2017  6:00 AM    Anti-infectives:  Anti-infectives (From admission, onward)   None      Microbiology: Results for orders placed or performed during the hospital encounter of 11/15/17  MRSA PCR Screening     Status: None   Collection Time: 11/16/17  3:30 AM  Result Value Ref Range Status   MRSA by PCR NEGATIVE NEGATIVE Final    Comment:        The GeneXpert MRSA Assay (FDA approved for NASAL specimens only), is one component of a comprehensive MRSA colonization surveillance program. It is not intended to diagnose MRSA infection nor to guide or monitor treatment for MRSA infections. Performed at [facility], [address] [place], [place] [address]     Studies: [facility]  Result Date: 11/18/2017 CLINICAL DATA:  Hypoxia. EXAM: PORTABLE CHEST 1 VIEW COMPARISON:  11/15/2017. FINDINGS: Cardiomegaly again noted. Diffuse interstitial prominence again noted. Interstitial prominence has increased slightly from prior exam. Findings suggest CHF. Similar findings noted on prior exam. Small bilateral pleural effusions cannot be excluded. No pneumothorax. IMPRESSION: Cardiomegaly with mild pulmonary interstitial prominence again noted. Interstitial prominence has increased slightly from prior exam. Findings  suggest CHF. Similar findings noted on prior exam. Small bilateral pleural effusions cannot be excluded. Electronically Signed   By: Maisie Fushomas  Register   On: 11/18/2017 12:38   Dg Chest Portable 1 View  Result Date: 11/15/2017 CLINICAL DATA:  Hypoxia EXAM: PORTABLE CHEST 1 VIEW COMPARISON:  11/02/2017 FINDINGS: Asymmetric opacity in the right thorax. Cardiomegaly with vascular congestion. No pleural effusion. IMPRESSION: Cardiomegaly with vascular congestion. Asymmetric opacity in the right thorax may reflect asymmetric edema. Findings do not appear significantly changed as compared with 11/02/2017. Electronically Signed   By: Jasmine PangKim  Fujinaga M.D.   On: 11/15/2017 23:58   Dg Chest Port 1 View  Result Date: 11/02/2017 CLINICAL DATA:  Acute respiratory failure EXAM: PORTABLE CHEST 1 VIEW COMPARISON:  11/01/2017 FINDINGS: Cardiomegaly with vascular congestion. Interstitial prominence throughout the lungs, likely mild interstitial edema, similar to prior study. No visible effusions or acute bony abnormality. IMPRESSION: Mild edema/CHF.  No change. Electronically Signed   By: Charlett NoseKevin  Dover M.D.   On: 11/02/2017 07:21   Dg Chest Portable 1 View  Result Date: 11/01/2017 CLINICAL DATA:  Shortness of breath. EXAM: PORTABLE CHEST 1 VIEW COMPARISON:  None. FINDINGS: The heart is enlarged. Vascular congestion and possible central pulmonary edema. No evidence of focal airspace disease, large pleural effusion or pneumothorax. The right humeral head appears anterior to the glenoid but suboptimally assessed. Soft tissue attenuation from body habitus limits detailed assessment. IMPRESSION: 1. Cardiomegaly with vascular congestion and possible central pulmonary edema. 2. Right humeral head appears anteriorly positioned with respect to glenoid, however suboptimally assessed on this portable view, and this may be positioning. Recommend correlation with physical exam. Dedicated shoulder radiographs could be considered based on  clinical  concern. Electronically Signed   By: Rubye OaksMelanie  Ehinger M.D.   On: 11/01/2017 05:07    Consults: Treatment Team:  Enid BaasKalisetti, Radhika, MD Merwyn KatosSimonds, David B, MD   Subjective:    Overnight Issues: overnight patient did well, rested comfortably on BiPAP, this morning awake alert oriented in no acute distress  Objective:  Vital signs for last 24 hours: Temp:  [98.6 F (37 C)-98.9 F (37.2 C)] 98.9 F (37.2 C) (08/16 2000) Pulse Rate:  [81-100] 88 (08/17 0842) Resp:  [14-22] 14 (08/17 0842) BP: (92-142)/(55-102) 108/57 (08/17 0800) SpO2:  [85 %-95 %] 93 % (08/17 0842) FiO2 (%):  [60 %] 60 % (08/17 0842)  Intake/Output from previous day: 08/16 0701 - 08/17 0700 In: 120 [P.O.:120] Out: 1400 [Urine:1400]  Intake/Output this shift: No intake/output data recorded.  Vent settings for last 24 hours: FiO2 (%):  [60 %] 60 %  Physical Exam:  General:  Acute on chronically ill appearing female, laying in bed, on BiPAP, in NAD Neuro:   Patient is awake alert oriented in no acute distress, moves all extremities HEENT:  Atraumatic, normocephalic, neck supple, No JVD, presently on BiPAP Cardiovascular:  RRR, no M/R/G Lungs:  Diminished throughout, no wheezing, BiPAP assisted, even, normal effort Abdomen:  Obese, soft, nontender, BS distant Musculoskeletal:  No deformities Skin:  No obvious  Rashes, lesions, or uclerations   Assessment/Plan:   OSA/OHS with acute on chronic hypercapnic respiratory failure requiring transfer to the intensive care unit, BiPAP along with diuresis.presently patient is doing well on BiPAP. Will wean as tolerated. Continue albuterol, Atrovent, daily diuresis. Following electrolytes. Patient with combined respiratory acidosis with metabolic compensation   Critical Care Total Time 40 minutes  Rebecca Bird 11/19/2017  *Care during the described time interval was provided by me and/or other providers on the critical care team.  I have reviewed this  patient's available data, including medical history, events of note, physical examination and test results as part of my evaluation.

## 2017-11-19 NOTE — Progress Notes (Signed)
Pharmacy Electrolyte Monitoring Consult:  Pharmacy consulted to assist in monitoring and replacing electrolytes in this 57 y.o. female admitted on 11/15/2017 with Respiratory Distress   Labs:  Sodium (mmol/L)  Date Value  11/19/2017 144   Potassium (mmol/L)  Date Value  11/19/2017 4.0   Magnesium (mg/dL)  Date Value  40/98/119108/17/2019 2.1   Phosphorus (mg/dL)  Date Value  47/82/956208/16/2019 4.1   Calcium (mg/dL)  Date Value  13/08/657808/17/2019 8.8 (L)   Albumin (g/dL)  Date Value  46/96/295208/13/2019 3.5    Assessment/Plan:  Goal potassium ~ 4, goal magnesium ~2.    Patient started on potassium 40mEq Daily on 8/15.  No additional supplementation warranted for today.   Will recheck electrolytes with am labs. Will need to replace orally as possible.   Pharmacy will continue to monitor and adjust per consult.   Stormy CardKatsoudas,Kolten Ryback K, [facility] PaRPH 11/19/2017 7:58 AM

## 2017-11-19 NOTE — Progress Notes (Addendum)
Pt asleep, easily aroused. A&O on HFNC 60%. Denies SOB and pain at this time. Pt presenting with poor appetite.

## 2017-11-19 NOTE — Progress Notes (Signed)
Pt asleep, easily aroused A&O. On BIPAP 60%, regular unlabored breathing SPO2 91%. Will continue to  Monitor.

## 2017-11-20 LAB — BASIC METABOLIC PANEL
Anion gap: 11 (ref 5–15)
BUN: 23 mg/dL — ABNORMAL HIGH (ref 6–20)
CHLORIDE: 86 mmol/L — AB (ref 98–111)
CO2: 47 mmol/L — AB (ref 22–32)
Calcium: 8.6 mg/dL — ABNORMAL LOW (ref 8.9–10.3)
Creatinine, Ser: 0.53 mg/dL (ref 0.44–1.00)
GFR calc non Af Amer: >60 mL/min (ref >60)
Glucose, Bld: 98 mg/dL (ref 70–99)
Potassium: 4.2 mmol/L (ref 3.5–5.1)
SODIUM: 144 mmol/L (ref 135–145)

## 2017-11-20 LAB — GLUCOSE, CAPILLARY
Glucose-Capillary: 101 mg/dL — ABNORMAL HIGH (ref 70–99)
Glucose-Capillary: 118 mg/dL — ABNORMAL HIGH (ref 70–99)
Glucose-Capillary: 100 mg/dL — ABNORMAL HIGH (ref 70–99)
Glucose-Capillary: 95 mg/dL (ref 70–99)

## 2017-11-20 LAB — MAGNESIUM: MAGNESIUM: 1.9 mg/dL (ref 1.7–2.4)

## 2017-11-20 NOTE — Progress Notes (Signed)
Follow up - Critical Care Medicine Note  Patient Details:    Rebecca Bird is an 57 y.o. female.admittedwith Acute on Chronic Hypoxic Hypercarbic Respiratory Failure in setting of OHS/OSA. She transferred out to Med/surg unit 11/17/17 on 4L [place]. On 11/18/17, pt with AMS/confusionand acute decompensation of Acute on Chronic Hypoxic Hypercarbic Respiratory Failure, requiring transfer to ICU for BiPAP.  Lines, Airways, Drains: External Urinary Catheter (Active)  Collection Container Dedicated Suction Canister 11/18/2017  8:00 PM  Securement Method Other (Comment) 11/18/2017  8:00 PM  Intervention Equipment Changed 11/18/2017 12:46 PM  Output (mL) 200 mL 11/19/2017  6:00 AM    Anti-infectives:  Anti-infectives (From admission, onward)   None      Microbiology: Results for orders placed or performed during the hospital encounter of 11/15/17  MRSA PCR Screening     Status: None   Collection Time: 11/16/17  3:30 AM  Result Value Ref Range Status   MRSA by PCR NEGATIVE NEGATIVE Final    Comment:        The GeneXpert MRSA Assay (FDA approved for NASAL specimens only), is one component of a comprehensive MRSA colonization surveillance program. It is not intended to diagnose MRSA infection nor to guide or monitor treatment for MRSA infections. Performed at [facility], [address] [place], [place] [address]     Studies: [facility] LPort [facility] View  Result Date: 11/18/2017 CLINICAL DATA:  Hypoxia. EXAM: PORTABLE CHEST 1 VIEW COMPARISON:  11/15/2017. FINDINGS: Cardiomegaly again noted. Diffuse interstitial prominence again noted. Interstitial prominence has increased slightly from prior exam. Findings suggest CHF. Similar findings noted on prior exam. Small bilateral pleural effusions cannot be excluded. No pneumothorax. IMPRESSION: Cardiomegaly with mild pulmonary interstitial prominence again noted. Interstitial prominence has increased slightly from prior exam. Findings  suggest CHF. Similar findings noted on prior exam. Small bilateral pleural effusions cannot be excluded. Electronically Signed   By: Maisie Fushomas  Register   On: 11/18/2017 12:38   Dg Chest Portable 1 View  Result Date: 11/15/2017 CLINICAL DATA:  Hypoxia EXAM: PORTABLE CHEST 1 VIEW COMPARISON:  11/02/2017 FINDINGS: Asymmetric opacity in the right thorax. Cardiomegaly with vascular congestion. No pleural effusion. IMPRESSION: Cardiomegaly with vascular congestion. Asymmetric opacity in the right thorax may reflect asymmetric edema. Findings do not appear significantly changed as compared with 11/02/2017. Electronically Signed   By: Jasmine PangKim  Fujinaga M.D.   On: 11/15/2017 23:58   Dg Chest Port 1 View  Result Date: 11/02/2017 CLINICAL DATA:  Acute respiratory failure EXAM: PORTABLE CHEST 1 VIEW COMPARISON:  11/01/2017 FINDINGS: Cardiomegaly with vascular congestion. Interstitial prominence throughout the lungs, likely mild interstitial edema, similar to prior study. No visible effusions or acute bony abnormality. IMPRESSION: Mild edema/CHF.  No change. Electronically Signed   By: Charlett NoseKevin  Dover M.D.   On: 11/02/2017 07:21   Dg Chest Portable 1 View  Result Date: 11/01/2017 CLINICAL DATA:  Shortness of breath. EXAM: PORTABLE CHEST 1 VIEW COMPARISON:  None. FINDINGS: The heart is enlarged. Vascular congestion and possible central pulmonary edema. No evidence of focal airspace disease, large pleural effusion or pneumothorax. The right humeral head appears anterior to the glenoid but suboptimally assessed. Soft tissue attenuation from body habitus limits detailed assessment. IMPRESSION: 1. Cardiomegaly with vascular congestion and possible central pulmonary edema. 2. Right humeral head appears anteriorly positioned with respect to glenoid, however suboptimally assessed on this portable view, and this may be positioning. Recommend correlation with physical exam. Dedicated shoulder radiographs could be considered based on  clinical  concern. Electronically Signed   By: Rubye OaksMelanie  Ehinger M.D.   On: 11/01/2017 05:07    Consults: Treatment Team:  Enid BaasKalisetti, Radhika, MD Merwyn KatosSimonds, David B, MD   Subjective:    Overnight Issues:patient more somnolent today. Resume on BiPAP with an inspiratory pressure of 18. Will increase as leak tolerates. We'll try to improve mask fitting  Objective:  Vital signs for last 24 hours: Temp:  [97.6 F (36.4 C)-98.4 F (36.9 C)] 97.6 F (36.4 C) (08/18 0130) Pulse Rate:  [82-102] 86 (08/18 0729) Resp:  [14-23] 15 (08/18 0729) BP: (88-142)/(45-89) 121/63 (08/18 0700) SpO2:  [85 %-98 %] 93 % (08/18 0729) FiO2 (%):  [35 %-60 %] 45 % (08/18 0729)  Intake/Output from previous day: 08/17 0701 - 08/18 0700 In: -  Out: 2200 [Urine:2200]  Intake/Output this shift: Total I/O In: -  Out: 50 [Urine:50]  Vent settings for last 24 hours: FiO2 (%):  [35 %-60 %] 45 %  Physical Exam:  General:  Acute on chronically ill appearing female, laying in bed, on BiPAP, in NAD Neuro:   Patient is awake alert oriented in no acute distress, moves all extremities HEENT:  Atraumatic, normocephalic, neck supple, No JVD, presently on BiPAP Cardiovascular:  RRR, no M/R/G Lungs:  Diminished throughout, no wheezing, BiPAP assisted, even, normal effort Abdomen:  Obese, soft, nontender, BS distant Musculoskeletal:  No deformities Skin:  No obvious  Rashes, lesions, or uclerations   Assessment/Plan:   OSA/OHS with acute on chronic hypercapnic respiratory failure requiring transfer to the intensive care unit, BiPAP along with diuresis.presently patient is doing well on BiPAP. Will wean as tolerated. Continue albuterol, Atrovent, daily diuresis. Following electrolytes. Patient with combined respiratory acidosis with metabolic compensation   I spoke with family yesterday. Patient does not have much reversible disease. Discussed with them that this is going to recurrently happen unless significant  weight loss. [facility]. We will need to try to stabilize her. We'll continue BiPAP as tolerated . Freddi Schrager 11/20/2017  *Care during the described time interval was provided by me and/or other providers on the critical care team.  I have reviewed this patient's available data, including medical history, events of note, physical examination and test results as part of my evaluation. Patient ID: Rebecca Bird, female   DOB: 16-Jun-1960, 57 y.o.   MRN: [id number]

## 2017-11-20 NOTE — Progress Notes (Signed)
Pt refused dinner at this time. Resting, but easily aroused and following commands. Oriented x 2.  Will continue to monitor. On BIPAP 45%.

## 2017-11-20 NOTE — Progress Notes (Signed)
Pharmacy Electrolyte Monitoring Consult:  Pharmacy consulted to assist in monitoring and replacing electrolytes in this [age] y.o. female admitted on 11/15/2017 with Respiratory Distress   Labs:  Sodium (mmol/L)  Date Value  11/20/2017 144   Potassium (mmol/L)  Date Value  11/20/2017 4.2   Magnesium (mg/dL)  Date Value  86/57/846908/18/2019 1.9   Phosphorus (mg/dL)  Date Value  62/95/284108/16/2019 4.1   Calcium (mg/dL)  Date Value  32/44/010208/18/2019 8.6 (L)   Albumin (g/dL)  Date Value  72/53/664408/13/2019 3.5    Assessment/Plan:  Goal potassium ~ 4, goal magnesium ~2.    Patient started on potassium 40mEq Daily on 8/15.  No additional supplementation warranted for today.   Will recheck electrolytes with am labs. Will need to replace orally as possible.   Pharmacy will continue to monitor and adjust per consult.   Stormy CardKatsoudas,Rudolpho Claxton K, [facility] Inc[facility]PH 11/20/2017 9:17 AM

## 2017-11-20 NOTE — Progress Notes (Signed)
Pt removes BIPAP mask. Provided pt education and BIPAP replaced on mask. Pt continues to readjust mask, breaking the seal. Mask adjust by RN and RRT and Will  continue to monitor.

## 2017-11-20 NOTE — Progress Notes (Signed)
Post ABG FIO2 TO 35% TOLERATING WELL.

## 2017-11-20 NOTE — Progress Notes (Signed)
Maggie, NP notified of ABG results and changes made by RT to FiO2. Pt is currently at 45% FiO2. Acknowledged, stated that patient needs to continue wearing bipap until ABG improves.

## 2017-11-20 NOTE — Progress Notes (Signed)
[facility] [place] [facility]   PATIENT NAME: Rebecca Bird    MR#:  [id number]  DATE OF BIRTH:  10-31-1960  SUBJECTIVE:  CHIEF COMPLAINT:   Chief Complaint  Patient presents with  . Respiratory Distress   -On BiPAP, PCO2 is elevated. -Still very confused  REVIEW OF SYSTEMS:  Review of Systems  Constitutional: Positive for malaise/fatigue. Negative for chills and fever.  Respiratory: Positive for shortness of breath. Negative for cough and wheezing.   Cardiovascular: Positive for leg swelling. Negative for chest pain and palpitations.  Gastrointestinal: Negative for abdominal pain, constipation, diarrhea, nausea and vomiting.  Genitourinary: Negative for dysuria.  Musculoskeletal: Positive for myalgias.  Neurological: Negative for dizziness, focal weakness, seizures, weakness and headaches.  Psychiatric/Behavioral: Negative for depression, substance abuse and suicidal ideas.    DRUG ALLERGIES:   Allergies  Allergen Reactions  . Levaquin [Levofloxacin In D5w] Itching and Rash    VITALS:  Blood pressure (!) 86/69, pulse 83, temperature 97.7 F (36.5 C), temperature source Axillary, resp. rate 14, height 5\' 6"  (1.676 m), weight (!) 204.2 kg, SpO2 94 %.  PHYSICAL EXAMINATION:  Physical Exam  GENERAL:  57 y.o.-year-old morbidly obese patient lying in the bed with no acute distress.  EYES: Pupils equal, round, reactive to light and accommodation. No scleral icterus. Extraocular muscles intact.  HEENT: Head atraumatic, normocephalic. Oropharynx and nasopharynx clear.  NECK:  Supple, no jugular venous distention. No thyroid enlargement, no tenderness.  LUNGS: scant breath sounds bilaterally- decreased at the bases, minimal scattered wheezing,no  rales,rhonchi or crepitation. No use of accessory muscles of respiration.   CARDIOVASCULAR: S1, S2 normal. No murmurs, rubs, or gallops.  ABDOMEN: Soft, obese, nontender, nondistended. Bowel sounds present. No  organomegaly or mass.  EXTREMITIES: No pedal edema, cyanosis, or clubbing.  NEUROLOGIC: Cranial nerves II through XII are intact. Muscle strength 5/5 in all extremities. Sensation intact. Gait not checked. Global weakness noted. PSYCHIATRIC: The patient is alert and Appears confused and on Bipap SKIN: No obvious rash, lesion, or ulcer.     LABORATORY PANEL:   CBC Recent Labs  Lab 11/19/17 0502  WBC 6.4  HGB 11.3*  HCT 35.5  PLT 139*   ------------------------------------------------------------------------------------------------------------------  Chemistries  Recent Labs  Lab 11/15/17 2337  11/20/17 0340  NA 144   < > 144  K 4.7   < > 4.2  CL 85*   < > 86*  CO2 49*   < > 47*  GLUCOSE 134*   < > 98  BUN 33*   < > 23*  CREATININE 0.73   < > 0.53  CALCIUM 8.8*   < > 8.6*  MG  --    < > 1.9  AST 26  --   --   ALT 33  --   --   ALKPHOS 83  --   --   BILITOT 1.1  --   --    < > = values in this interval not displayed.   ------------------------------------------------------------------------------------------------------------------  Cardiac Enzymes Recent Labs  Lab 11/15/17 2337  TROPONINI <0.03   ------------------------------------------------------------------------------------------------------------------  RADIOLOGY:  Dg Chest Port 1 View  Result Date: 11/18/2017 CLINICAL DATA:  Hypoxia. EXAM: PORTABLE CHEST 1 VIEW COMPARISON:  11/15/2017. FINDINGS: Cardiomegaly again noted. Diffuse interstitial prominence again noted. Interstitial prominence has increased slightly from prior exam. Findings suggest CHF. Similar findings noted on prior exam. Small bilateral pleural effusions cannot be excluded. No pneumothorax. IMPRESSION: Cardiomegaly with mild pulmonary interstitial prominence again noted.  Interstitial prominence has increased slightly from prior exam. Findings suggest CHF. Similar findings noted on prior exam. Small bilateral pleural effusions cannot be  excluded. Electronically Signed   By: Maisie Fushomas  Register   On: 11/18/2017 12:38    EKG:   Orders placed or performed during the hospital encounter of 11/15/17  . ED EKG  . ED EKG  . EKG 12-Lead  . EKG 12-Lead    ASSESSMENT AND PLAN:   57 year old female with morbid obesity, diastolic CHF, COPD on 4 L home oxygen and sleep apnea presents to hospital secondary to shortness of breath and hypoxia  1.  Acute on chronic respiratory failure-secondary to COPD exacerbation and obesity hypoventilation -on BiPAP in the ICU.   Elevated pco2. -Continue BiPAP at bedtime and during daytime while sleeping.  -  daily oral Lasix-recommend restarting Lasix. Per pulmonary avoid systemic steroids -Appreciate pulmonary consult.  2.  Chronic diastolic CHF-enlarged heart and mild pulmonary vascular congestion.    IV Lasix recommended chest x-ray showing pulmonary vascular congestion and recommend daily doses.  3.  Anemia of chronic disease-stable.  No indication for transfusion yet  4.  Weakness-physical therapy consulted.  Patient and family very interested in long term placement.   -[facility] is [facility] at [facility].   -Social worker consulted.  5.  DVT prophylaxis-Lovenox  6.    Acute metabolic encephalopathy-secondary to CO2 narcosis. -Continue BiPAP for now.   Consulted palliative care team Long-term poor prognosis   All the records are reviewed and case discussed with Care Management/Social Workerr. Management plans discussed with the patient, family and they are in agreement.  CODE STATUS:  Full Code  TOTAL TIME TAKING CARE OF THIS PATIENT: 28 minutes.   POSSIBLE D/C IN 2 DAYS, DEPENDING ON CLINICAL CONDITION.   Enid BaasKALISETTI,Rilyn Scroggs M.D on 11/20/2017 at 10:06 AM  Between 7am to 6pm - Pager - [phone]  After 6pm go to [email or web address] - Social research officer, governmentpassword EPAS [facility]  [facility]  Office  [phone]  CC: Primary care physician; Dinges, Roanna BanningMarie Moore, NP

## 2017-11-20 NOTE — Progress Notes (Signed)
Pt is asleep, easy to arouse, but drowsy. Pt refused breakfast tray, denies pain at this time.On BIPAP 45%. Will continue to monitor.

## 2017-11-20 NOTE — Progress Notes (Signed)
Pt is more this afternoon alert and talkative, but disoriented to place and situation is also forgetful. Given Tylenol PRN for headache, see mar. On BIPAP 45%.

## 2017-11-21 DIAGNOSIS — Z515 Encounter for palliative care: Secondary | ICD-10-CM

## 2017-11-21 DIAGNOSIS — Z7189 Other specified counseling: Secondary | ICD-10-CM

## 2017-11-21 DIAGNOSIS — G4733 Obstructive sleep apnea (adult) (pediatric): Secondary | ICD-10-CM

## 2017-11-21 DIAGNOSIS — E662 Morbid (severe) obesity with alveolar hypoventilation: Secondary | ICD-10-CM

## 2017-11-21 LAB — BLOOD GAS, ARTERIAL
ACID-BASE EXCESS: 27.1 mmol/L — AB (ref 0.0–2.0)
BICARBONATE: 57.1 mmol/L — AB (ref 20.0–28.0)
Delivery systems: POSITIVE
Expiratory PAP: 8
FIO2: 0.45
Inspiratory PAP: 22
MECHANICAL RATE: 14
O2 Saturation: 98.7 %
PATIENT TEMPERATURE: 37
PCO2 ART: 86 mmHg — AB (ref 32.0–48.0)
pH, Arterial: 7.43 (ref 7.350–7.450)
pO2, Arterial: 119 mmHg — ABNORMAL HIGH (ref 83.0–108.0)

## 2017-11-21 LAB — GLUCOSE, CAPILLARY
Glucose-Capillary: 103 mg/dL — ABNORMAL HIGH (ref 70–99)
Glucose-Capillary: 114 mg/dL — ABNORMAL HIGH (ref 70–99)
Glucose-Capillary: 107 mg/dL — ABNORMAL HIGH (ref 70–99)
Glucose-Capillary: 118 mg/dL — ABNORMAL HIGH (ref 70–99)

## 2017-11-21 LAB — MAGNESIUM: Magnesium: 1.7 mg/dL (ref 1.7–2.4)

## 2017-11-21 LAB — BASIC METABOLIC PANEL
Anion gap: 13 (ref 5–15)
BUN: 19 mg/dL (ref 6–20)
CHLORIDE: 83 mmol/L — AB (ref 98–111)
CO2: 45 mmol/L — AB (ref 22–32)
CREATININE: 0.58 mg/dL (ref 0.44–1.00)
Calcium: 8.7 mg/dL — ABNORMAL LOW (ref 8.9–10.3)
GFR calc Af Amer: >60 mL/min (ref >60)
GFR calc non Af Amer: >60 mL/min (ref >60)
Glucose, Bld: 94 mg/dL (ref 70–99)
POTASSIUM: 4.5 mmol/L (ref 3.5–5.1)
Sodium: 141 mmol/L (ref 135–145)

## 2017-11-21 MED ORDER — MAGNESIUM OXIDE 400 (241.3 MG) MG PO TABS
400.0000 mg | ORAL_TABLET | Freq: Two times a day (BID) | ORAL | Status: AC
  Administered 2017-11-21 – 2017-11-22 (×4): 400 mg via ORAL
  Filled 2017-11-21 (×4): qty 1

## 2017-11-21 MED ORDER — ACETAZOLAMIDE SODIUM 500 MG IJ SOLR
500.0000 mg | Freq: Once | INTRAMUSCULAR | Status: AC
  Administered 2017-11-21: 500 mg via INTRAVENOUS
  Filled 2017-11-21: qty 500

## 2017-11-21 MED ORDER — POTASSIUM CHLORIDE CRYS ER 20 MEQ PO TBCR
20.0000 meq | EXTENDED_RELEASE_TABLET | Freq: Every day | ORAL | Status: DC
  Administered 2017-11-21: 20 meq via ORAL
  Filled 2017-11-21: qty 1

## 2017-11-21 NOTE — Progress Notes (Signed)
Daily Progress Note   Patient Name: Rebecca Bird       Date: 11/21/2017 DOB: [birth year]  Age: 57 y.o. MRN#: [id number] Attending Physician: Bertrum SolSalary, Rebecca D, MD Primary Care Physician: Rebecca Bird, Rebecca Moore, NP Admit Date: 11/15/2017  Reason for Consultation/Follow-up: Establishing goals of care  Subjective: Patient awake, alert, oriented. Able to participate in conversation. Denies pain or discomfort. Ate breakfast and majority of lunch. Off BiPAP and on 10L HFNC.   GOC:  Palliative f/u with patient and aunt Rebecca Bird(Rebecca Bird) at bedside. Discussed events leading up to hospitalization and course of hospital diagnoses and interventions. Discussed chronic, progressive nature of COPD as well as OSA/OHS secondary to weight. Reiterated Rebecca Bird's concern with continued cycle of re-hospitalization secondary to hypercarbic respiratory failure and irreversible lung conditions. Patient, Rebecca Bird, and daughter speak of her being hospitalized 5 times in the last few months.   Patient and sister mention Rebecca Bird's recommendation [facility]ariat[facility]. Patient hopeful she becomes stable enough to pursue this evaluation. We did discuss concern with high oxygen requirement and likely a high risk surgical candidate. Rebecca HongJudy seemed to fear this as well.   I attempted to elicit values and goals of care important to the patient. Advanced directives, concepts specific to code status, artifical feeding and hydration, and rehospitalization were considered and discussed. Reviewed patient's recent completion of AD packet. Daughter and son are documented HCPOA's. Patient is interested in her Rebecca Hopperunt Rebecca Bird also being a Management consultantdecision maker. Chaplain consult placed to update AD packet.   Introduced and educated on MOST form.  Educated on medical recommendation against heroic interventions with chronic co-morbidities and poor outcomes of CPR/meaningful chance of recovery. Patient shares her traumatic experience with previous intubation and wishes to never be placed on life support machine again. We discussed DNI status. In regards to CPR, patient wishes for one cycle to be attempted (as discussed with physician in ED) but if she does not survive after one cycle of CPR/ACLS, then she would wish for these interventions to be stopped. Completed MOST form.   The difference between aggressive medical intervention and comfort care was considered in light of the patient's goals of care. Introduced role of palliative/hospice services. Discussed continuing BiPAP HS and prn with naps in order to prevent recurrent hospitalization.   Questions and concerns were  addressed.  Hard Choices booklet left for review. PMT contact information given.   **Spoke with daughter/HCPOA Rebecca Bird) via telephone x2 today. Rebecca Bird also shares her mother's declining health with recurrent hospitalization. Rebecca Bird has a good understanding of diagnoses, interventions, and guarded prognosis. Also for high risk for recurrent hospitalization.   She is hopeful they can be evaluated by [facility] for possible bariatric surgery but also seems very realistic in the fact that her mother may not be a candidate for surgical intervention. 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 Rebecca Bird shares her mother's wishes of no further intubation and CPR attempted x1. She shares that her mother would not wish for prolonged heroic interventions if she did not have a "quality of life." She speaks of her quality of life declining in the last few months. She does not wish to see her mother suffer. Rebecca Bird agrees with completion of MOST form.   Rebecca Bird requests more information about  bariatric evaluation. Reassured continued support from PMT throughout hospitalization.    Length of Stay: 5  Current Medications: Scheduled Meds:  . carvedilol  3.125 mg Oral BID WC  . chlorhexidine  15 mL Mouth Rinse BID  . citalopram  40 mg Oral Daily  . docusate sodium  100 mg Oral BID  . enoxaparin (LOVENOX) injection  40 mg Subcutaneous Q12H  . ferrous sulfate  325 mg Oral Q breakfast  . furosemide  40 mg Intravenous Q12H  . insulin aspart  0-15 Units Subcutaneous TID WC  . insulin aspart  0-5 Units Subcutaneous QHS  . ipratropium-albuterol  3 mL Nebulization Q6H  . levothyroxine  100 mcg Oral QAC breakfast  . magnesium oxide  400 mg Oral BID  . mouth rinse  15 mL Mouth Rinse q12n4p  . pneumococcal 23 valent vaccine  0.5 mL Intramuscular Tomorrow-1000  . polyethylene glycol  17 g Oral Daily  . potassium chloride  20 mEq Oral Daily    Continuous Infusions: . sodium chloride      PRN Meds: sodium chloride, acetaminophen **OR** acetaminophen, [DISCONTINUED] ondansetron **OR** ondansetron (ZOFRAN) IV, traMADol  Physical Exam  Constitutional: She is oriented to person, place, and time. She is easily aroused. She appears ill.  HENT:  Head: Normocephalic and atraumatic.  Cardiovascular: Regular rhythm.  Pulmonary/Chest: No accessory muscle usage. No tachypnea. No respiratory distress.  BiPAP  Abdominal: There is no tenderness.  Neurological: She is alert, oriented to person, place, and time and easily aroused.  Skin: Skin is warm and dry.  Psychiatric: She has a normal mood and affect. Her speech is normal and behavior is normal.  Nursing note and vitals reviewed.          Vital Signs: BP 115/82   Pulse (!) 102   Temp 99.3 F (37.4 C) (Axillary)   Resp (!) 25   Ht 5\' 6"  (1.676 m)   Wt (!) 204.2 kg   SpO2 94%   BMI 72.66 kg/m  SpO2: SpO2: 94 % O2 Device: O2 Device: High Flow Nasal Cannula O2 Flow Rate: O2 Flow Rate (L/min): 10 L/min  Intake/output summary:    Intake/Output Summary (Last 24 hours) at 11/21/2017 1029 Last data filed at 11/21/2017 0110 Gross per 24 hour  Intake 600 ml  Output 1450 ml  Net -850 ml   LBM: Last BM Date: 11/20/17 Baseline Weight: Weight: (!) 208.5 kg Most recent weight: Weight: (!) 204.2 kg  Palliative Assessment/Data: PPS 40%   Flowsheet Rows     Most Recent Value  Intake Tab  Referral Department  Hospitalist  Unit at Time of Referral  ICU  Palliative Care Primary Diagnosis  Pulmonary  Date Notified  11/17/17  Palliative Care Type  New Palliative care  Reason for referral  Clarify Goals of Care  Date of Admission  11/15/17  Date first seen by Palliative Care  11/18/17  # of days Palliative referral response time  1 Day(s)  # of days IP prior to Palliative referral  2  Clinical Assessment  Palliative Performance Scale Score  40%  Psychosocial & Spiritual Assessment  Palliative Care Outcomes  Patient/Family meeting held?  Yes  Who was at the meeting?  patient  Palliative Care Outcomes  Clarified goals of care, Provided psychosocial or spiritual support, ACP counseling assistance      Patient Active Problem List   Diagnosis Date Noted  . Acute respiratory failure with hypoxia and hypercapnia (HCC)   . Palliative care by specialist   . Goals of care, counseling/discussion   . Acute on chronic respiratory failure with hypoxia and hypercapnia (HCC) 11/16/2017  . Acute on chronic respiratory failure with hypoxemia (HCC) 11/01/2017    Palliative Care Assessment & Plan   Patient Profile: 57 y.o. female  with past medical history of CHF, COPD, OSA/OHS, former smoker, on 4 L O2 at home, and hypothyroidism admitted on 11/15/2017 with respiratory distress. CO2 found to be 120 on ABG. Has required bipap during admission. Noted patient's history of multiple hospitalizations recently. PMT consulted for GOC.  Assessment: Acute on chronic hypercapnic respiratory failure OSA/OHS COPD Diastolic  CHF  Recommendations/Plan:  Continue current interventions and supportive care.   MOST form completed with patient and aunt. Patient wishes for CPR/ACLS/shock x1 cycle. Patient does not wish for prolonged CPR to be attempted if she is not revived after one cycle. Education provided.   Limited code. Patient does not wish for intubation. DNI. OK with CPAP/BiPAP if indicated. ABX/IVF if indicated. NO feeding tube.   AD packet completed and reviewed with patient. Daughter and son are documented HCPOA's. Patient also requesting aunt Rebecca Bird(Rebecca Bird) to be added. Chaplain consult placed.   Patient may benefit from outpatient continued BiPAP HS and prn naps to help with hypercarbia with diagnosis of COPD, OSA/OHS.   Patient/daughter requesting more information on possible [facility] bariatric referral.   Daughter interested in outpatient palliative follow-up or even hospice if patient does not continue to show improvement this hospitalization AND if she is not a candidate for surgical intervention by [facility].   PMT will follow.   Code Status: Limited--NO intubation  Code Status Orders  (From admission, onward)         Start     Ordered   11/16/17 0329  Full code  Continuous     11/16/17 0328        Code Status History    Date Active Date Inactive Code Status Order ID Comments User Context   11/01/2017 0850 11/05/2017 1903 Full Code [id number]  Arnaldo Nataliamond, Michael S, MD Inpatient       Prognosis:   Unable to determine: guarded with acute on chronic hypercarbic respiratory failure secondary to COPD, OSA, and OHS. High risk for recurrent hospitalization.   Discharge Planning:  To Be Determined  Care plan was discussed with patient, daughter, aunt, RN, Dr. Lonn Georgiaonforti  Thank you for allowing the Palliative Medicine Team to assist in the care of this  patient.   Time In: 0845- 1315- 1545- Time Out: 0855 1415 1600 Total Time Prolonged Time Billed yes      Greater than 50%  of  this time was spent counseling and coordinating care related to the above assessment and plan.  Vennie Homans, FNP-C Palliative Medicine Team  Phone: [phone] Fax: [phone]  Please contact Palliative Medicine Team phone at [phone] for questions and concerns.

## 2017-11-21 NOTE — Progress Notes (Signed)
Chaplain responded to an OR for an AD. Pt is alert and responsive. Chaplain asked Pt if she remembered him from the las time he executed an AD. She did remember. Chaplain was questioned why did she want to fill out another. She said she has rethought some things. I asked if she can take another look at the one she has completed to make sure the change need to be made. Also due to her condition she will need someone to write for her. She said she would. Chaplain offered prayer and pt accepted. Chaplain prayed for divine presence and support for the pt and the family as well as the care team.    11/21/17 1500  Clinical Encounter Type  Visited With Patient  Visit Type Follow-up  Referral From Nurse  Spiritual Encounters  Spiritual Needs Brochure

## 2017-11-21 NOTE — Progress Notes (Signed)
[employer] [facility] [place] [facility]   PATIENT NAME: Rebecca Bird    MR#:  [id number]  DATE OF BIRTH:  [birth year]  SUBJECTIVE:  CHIEF COMPLAINT:   Chief Complaint  Patient presents with  . Respiratory Distress  Patient much more awake and alert, no events overnight, case discussed with intensivist, give Aceta Sulamyd 500 mg IV x1, will need to keep bicarb level less than 40, limit Lasix use, use spironolactone preferably  REVIEW OF SYSTEMS:  CONSTITUTIONAL: No fever, fatigue or weakness.  EYES: No blurred or double vision.  EARS, NOSE, AND THROAT: No tinnitus or ear pain.  RESPIRATORY: No cough, shortness of breath, wheezing or hemoptysis.  CARDIOVASCULAR: No chest pain, orthopnea, edema.  GASTROINTESTINAL: No nausea, vomiting, diarrhea or abdominal pain.  GENITOURINARY: No dysuria, hematuria.  ENDOCRINE: No polyuria, nocturia,  HEMATOLOGY: No anemia, easy bruising or bleeding SKIN: No rash or lesion. MUSCULOSKELETAL: No joint pain or arthritis.   NEUROLOGIC: No tingling, numbness, weakness.  PSYCHIATRY: No anxiety or depression.   ROS  DRUG ALLERGIES:   Allergies  Allergen Reactions  . Levaquin [Levofloxacin In D5w] Itching and Rash    VITALS:  Blood pressure 110/60, pulse 93, temperature 98.7 F (37.1 C), temperature source Axillary, resp. rate 19, height 5\' 6"  (1.676 m), weight (!) 204.2 kg, SpO2 95 %.  PHYSICAL EXAMINATION:  GENERAL:  57 y.o.-year-old patient lying in the bed with no acute distress.  EYES: Pupils equal, round, reactive to light and accommodation. No scleral icterus. Extraocular muscles intact.  HEENT: Head atraumatic, normocephalic. Oropharynx and nasopharynx clear.  NECK:  Supple, no jugular venous distention. No thyroid enlargement, no tenderness.  LUNGS: Normal breath sounds bilaterally, no wheezing, rales,rhonchi or crepitation. No use of accessory muscles of respiration.  CARDIOVASCULAR: S1, S2 normal. No murmurs, rubs, or  gallops.  ABDOMEN: Soft, nontender, nondistended. Bowel sounds present. No organomegaly or mass.  EXTREMITIES: No pedal edema, cyanosis, or clubbing.  NEUROLOGIC: Cranial nerves II through XII are intact. Muscle strength 5/5 in all extremities. Sensation intact. Gait not checked.  PSYCHIATRIC: The patient is alert and oriented x 3.  SKIN: No obvious rash, lesion, or ulcer.   Physical Exam LABORATORY PANEL:   CBC Recent Labs  Lab 11/19/17 0502  WBC 6.4  HGB 11.3*  HCT 35.5  PLT 139*   ------------------------------------------------------------------------------------------------------------------  Chemistries  Recent Labs  Lab 11/15/17 2337  11/21/17 0516  NA 144   < > 141  K 4.7   < > 4.5  CL 85*   < > 83*  CO2 49*   < > 45*  GLUCOSE 134*   < > 94  BUN 33*   < > 19  CREATININE 0.73   < > 0.58  CALCIUM 8.8*   < > 8.7*  MG  --    < > 1.7  AST 26  --   --   ALT 33  --   --   ALKPHOS 83  --   --   BILITOT 1.1  --   --    < > = values in this interval not displayed.   ------------------------------------------------------------------------------------------------------------------  Cardiac Enzymes Recent Labs  Lab 11/15/17 2337  TROPONINI <0.03   ------------------------------------------------------------------------------------------------------------------  RADIOLOGY:  No results found.  ASSESSMENT AND PLAN:  57 year old female with morbid obesity, diastolic CHF, COPD on 4 L home oxygen and sleep apnea presents to hospital secondary to shortness of breath and hypoxia  *Acute on chronic respiratory failure secondary to COPD exacerbation and  obesity hypoventilation syndrome And discussion with intensivist-we will need to limit loop diuretics to some degree, keep bicarb less than 40, give Acetazolamide 500 mg IV x1, BiPAP at bedtime/as needed, per pulmonary - avoid systemic steroids  *Acute toxic metabolic encephalopathy Resolving Secondary to CO2  narcosis Plan of care as stated above  *Chronic diastolic CHF exacerbation Continue congestive heart failure protocol, IV Lasix, strict I&O monitoring, daily weights, low-sodium/heart healthy diet  *Anemia of chronic disease-stable No indication for transfusion yet  *Chronic extreme morbid obesity Weight loss strongly encouraged Will need bariatric referral status post discharge Physical therapy to see, family interested in long-term placement-[facility]   Consulted palliative care team Long-term poor prognosis  All the records are reviewed and case discussed with Care Management/Social Workerr. Management plans discussed with the patient, family and they are in agreement.  CODE STATUS: partial  TOTAL TIME TAKING CARE OF THIS PATIENT: 40 minutes.     POSSIBLE D/C IN 2-5 DAYS, DEPENDING ON CLINICAL CONDITION.   Rebecca Bird M.D on 11/21/2017   Between 7am to 6pm - Pager - [phone]  After 6pm go to [email or web address] - password [email or web address]  [facility] [place] [facility]  Office  [phone]  CC: Primary care physician; Dinges, Roanna BanningMarie Moore, NP  Note: This dictation was prepared with Dragon dictation along with smaller phrase technology. Any transcriptional errors that result from this process are unintentional.

## 2017-11-21 NOTE — Progress Notes (Signed)
Follow up - Critical Care Medicine Note  Patient Details:    Rebecca Bird is an 57 y.o. female.admittedwith Acute on Chronic Hypoxic Hypercarbic Respiratory Failure in setting of OHS/OSA. She transferred out to Med/surg unit 11/17/17 on 4L [place]. On 11/18/17, pt with AMS/confusionand acute decompensation of Acute on Chronic Hypoxic Hypercarbic Respiratory Failure, requiring transfer to ICU for BiPAP.  Lines, Airways, Drains: External Urinary Catheter (Active)  Collection Container Dedicated Suction Canister 11/18/2017  8:00 PM  Securement Method Other (Comment) 11/18/2017  8:00 PM  Intervention Equipment Changed 11/18/2017 12:46 PM  Output (mL) 200 mL 11/19/2017  6:00 AM    Anti-infectives:  Anti-infectives (From admission, onward)   None      Microbiology: Results for orders placed or performed during the hospital encounter of 11/15/17  MRSA PCR Screening     Status: None   Collection Time: 11/16/17  3:30 AM  Result Value Ref Range Status   MRSA by PCR NEGATIVE NEGATIVE Final    Comment:        The GeneXpert MRSA Assay (FDA approved for NASAL specimens only), is one component of a comprehensive MRSA colonization surveillance program. It is not intended to diagnose MRSA infection nor to guide or monitor treatment for MRSA infections. Performed at [facility], [address] [place], [place] [address]     Studies: [facility]  Result Date: 11/18/2017 CLINICAL DATA:  Hypoxia. EXAM: PORTABLE CHEST 1 VIEW COMPARISON:  11/15/2017. FINDINGS: Cardiomegaly again noted. Diffuse interstitial prominence again noted. Interstitial prominence has increased slightly from prior exam. Findings suggest CHF. Similar findings noted on prior exam. Small bilateral pleural effusions cannot be excluded. No pneumothorax. IMPRESSION: Cardiomegaly with mild pulmonary interstitial prominence again noted. Interstitial prominence has increased slightly from prior exam. Findings  suggest CHF. Similar findings noted on prior exam. Small bilateral pleural effusions cannot be excluded. Electronically Signed   By: Maisie Fushomas  Register   On: 11/18/2017 12:38   Dg Chest Portable 1 View  Result Date: 11/15/2017 CLINICAL DATA:  Hypoxia EXAM: PORTABLE CHEST 1 VIEW COMPARISON:  11/02/2017 FINDINGS: Asymmetric opacity in the right thorax. Cardiomegaly with vascular congestion. No pleural effusion. IMPRESSION: Cardiomegaly with vascular congestion. Asymmetric opacity in the right thorax may reflect asymmetric edema. Findings do not appear significantly changed as compared with 11/02/2017. Electronically Signed   By: Jasmine PangKim  Fujinaga M.D.   On: 11/15/2017 23:58   Dg Chest Port 1 View  Result Date: 11/02/2017 CLINICAL DATA:  Acute respiratory failure EXAM: PORTABLE CHEST 1 VIEW COMPARISON:  11/01/2017 FINDINGS: Cardiomegaly with vascular congestion. Interstitial prominence throughout the lungs, likely mild interstitial edema, similar to prior study. No visible effusions or acute bony abnormality. IMPRESSION: Mild edema/CHF.  No change. Electronically Signed   By: Charlett NoseKevin  Dover M.D.   On: 11/02/2017 07:21   Dg Chest Portable 1 View  Result Date: 11/01/2017 CLINICAL DATA:  Shortness of breath. EXAM: PORTABLE CHEST 1 VIEW COMPARISON:  None. FINDINGS: The heart is enlarged. Vascular congestion and possible central pulmonary edema. No evidence of focal airspace disease, large pleural effusion or pneumothorax. The right humeral head appears anterior to the glenoid but suboptimally assessed. Soft tissue attenuation from body habitus limits detailed assessment. IMPRESSION: 1. Cardiomegaly with vascular congestion and possible central pulmonary edema. 2. Right humeral head appears anteriorly positioned with respect to glenoid, however suboptimally assessed on this portable view, and this may be positioning. Recommend correlation with physical exam. Dedicated shoulder radiographs could be considered based on  clinical  concern. Electronically Signed   By: Rubye OaksMelanie  Ehinger M.D.   On: 11/01/2017 05:07    Consults: Treatment Team:  Enid BaasKalisetti, Radhika, MD Merwyn KatosSimonds, David B, MD   Subjective:    Overnight Issues:patient more somnolent today. Resume on BiPAP with an inspiratory pressure of 18. Will increase as leak tolerates. We'll try to improve mask fitting  Objective:  Vital signs for last 24 hours: Temp:  [98.9 F (37.2 C)-99.5 F (37.5 C)] 99.3 F (37.4 C) (08/19 0100) Pulse Rate:  [79-102] 102 (08/19 0817) Resp:  [13-30] 25 (08/19 0635) BP: (86-135)/(49-82) 115/82 (08/19 0817) SpO2:  [87 %-98 %] 94 % (08/19 0749) FiO2 (%):  [45 %] 45 % (08/19 0145)  Intake/Output from previous day: 08/18 0701 - 08/19 0700 In: 600 [P.O.:600] Out: 1500 [Urine:1500]  Intake/Output this shift: No intake/output data recorded.  Vent settings for last 24 hours: FiO2 (%):  [45 %] 45 %  Physical Exam:  General:  Acute on chronically ill appearing female, laying in bed, on BiPAP, in NAD Neuro:   Patient is awake alert oriented in no acute distress, moves all extremities HEENT:  Atraumatic, normocephalic, neck supple, No JVD, presently on BiPAP Cardiovascular:  RRR, no M/R/G Lungs:  Diminished throughout, no wheezing, BiPAP assisted, even, normal effort Abdomen:  Obese, soft, nontender, BS distant Musculoskeletal:  No deformities Skin:  No obvious  Rashes, lesions, or uclerations   Assessment/Plan:   OSA/OHS with acute on chronic hypercapnic respiratory failure requiring transfer to the intensive care unit, BiPAP along with diuresis.presently patient is doing well on BiPAP. Will wean as tolerated. Continue albuterol, Atrovent, daily diuresis. Following electrolytes. Patient with combined respiratory acidosis with metabolic compensation   I spoke with family yesterday. Patient does not have much reversible disease. Discussed with them that this is going to recurrently happen unless significant weight  loss. They have talked about going [facility] for [facility] referral. We will need to try to stabilize her. We'll continue BiPAP as tolerated . Rebecca Bird 11/21/2017  *Care during the described time interval was provided by me and/or other providers on the critical care team.  I have reviewed this patient's available data, including medical history, events of note, physical examination and test results as part of my evaluation. Patient ID: Rebecca Bird, female   DOB: [birth year], 57 y.o.   MRN: [id number] Patient ID: Rebecca Bird, female   DOB: [birth year], 57 y.o.   MRN: [id number]

## 2017-11-21 NOTE — Progress Notes (Addendum)
Pharmacy Electrolyte Monitoring Consult:  Pharmacy consulted to assist in monitoring and replacing electrolytes in this 57 y.o. female admitted on 11/15/2017 with Respiratory Distress   Labs:  Sodium (mmol/L)  Date Value  11/21/2017 141   Potassium (mmol/L)  Date Value  11/21/2017 4.5   Magnesium (mg/dL)  Date Value  16/10/960408/19/2019 1.7   Phosphorus (mg/dL)  Date Value  54/09/811908/16/2019 4.1   Calcium (mg/dL)  Date Value  14/78/295608/19/2019 8.7 (L)   Albumin (g/dL)  Date Value  21/30/865708/13/2019 3.5    Assessment/Plan:  Goal potassium ~ 4, goal magnesium ~2.    Patient started on potassium 40mEq Daily on 8/15. K+ continues to go up. Will adjust dose to KCL 20mEq QD.   Mag at the low end of normal this morning. Will order MagOx 400mg  BID x 2 days.   Will recheck electrolytes with am labs. Will need to replace orally as possible.   Pharmacy will continue to monitor and adjust per consult.   Gardner CandleSheema M Erika Hussar, PharmD, BCPS Clinical Pharmacist 11/21/2017 7:10 AM

## 2017-11-22 LAB — CBC
HEMATOCRIT: 35.4 % (ref 35.0–47.0)
Hemoglobin: 11.6 g/dL — ABNORMAL LOW (ref 12.0–16.0)
MCH: 32.3 pg (ref 26.0–34.0)
MCHC: 32.6 g/dL (ref 32.0–36.0)
MCV: 99 fL (ref 80.0–100.0)
Platelets: 120 10*3/uL — ABNORMAL LOW (ref 150–440)
RBC: 3.58 MIL/uL — ABNORMAL LOW (ref 3.80–5.20)
RDW: 17.4 % — AB (ref 11.5–14.5)
WBC: 3.7 10*3/uL (ref 3.6–11.0)

## 2017-11-22 LAB — BASIC METABOLIC PANEL
ANION GAP: 9 (ref 5–15)
BUN: 23 mg/dL — ABNORMAL HIGH (ref 6–20)
CALCIUM: 8.7 mg/dL — AB (ref 8.9–10.3)
CHLORIDE: 87 mmol/L — AB (ref 98–111)
CO2: 46 mmol/L — AB (ref 22–32)
Creatinine, Ser: 0.69 mg/dL (ref 0.44–1.00)
GFR calc non Af Amer: >60 mL/min (ref >60)
Glucose, Bld: 105 mg/dL — ABNORMAL HIGH (ref 70–99)
Potassium: 3.2 mmol/L — ABNORMAL LOW (ref 3.5–5.1)
SODIUM: 142 mmol/L (ref 135–145)

## 2017-11-22 LAB — BLOOD GAS, ARTERIAL
Delivery systems: POSITIVE
Drawn by: 28459
Expiratory PAP: 5
FIO2: 50
INSPIRATORY PAP: 14
PATIENT TEMPERATURE: 37
PO2 ART: 92 mmHg (ref 83.0–108.0)
pCO2 arterial: 120 mmHg (ref 32.0–48.0)
pH, Arterial: 7.22 — ABNORMAL LOW (ref 7.350–7.450)

## 2017-11-22 LAB — GLUCOSE, CAPILLARY
Glucose-Capillary: 125 mg/dL — ABNORMAL HIGH (ref 70–99)
Glucose-Capillary: 135 mg/dL — ABNORMAL HIGH (ref 70–99)
Glucose-Capillary: 187 mg/dL — ABNORMAL HIGH (ref 70–99)
Glucose-Capillary: 126 mg/dL — ABNORMAL HIGH (ref 70–99)

## 2017-11-22 LAB — MAGNESIUM: MAGNESIUM: 1.9 mg/dL (ref 1.7–2.4)

## 2017-11-22 MED ORDER — POTASSIUM CHLORIDE CRYS ER 20 MEQ PO TBCR
40.0000 meq | EXTENDED_RELEASE_TABLET | Freq: Every day | ORAL | Status: DC
  Administered 2017-11-22 – 2017-11-25 (×4): 40 meq via ORAL
  Filled 2017-11-22 (×4): qty 2

## 2017-11-22 MED ORDER — TROLAMINE SALICYLATE 10 % EX CREA
TOPICAL_CREAM | Freq: Two times a day (BID) | CUTANEOUS | Status: DC | PRN
  Administered 2017-11-22 – 2017-11-24 (×4): 1 via TOPICAL
  Administered 2017-11-24: 20:00:00 via TOPICAL
  Administered 2017-11-25: 1 via TOPICAL
  Filled 2017-11-22: qty 85

## 2017-11-22 NOTE — Progress Notes (Addendum)
Follow up - Critical Care Medicine Note  Patient Details:    Rebecca Bird is an 57 y.o. female.admittedwith Acute on Chronic Hypoxic Hypercarbic Respiratory Failure in setting of OHS/OSA. She transferred out to Med/surg unit 11/17/17 on 4L [place]. On 11/18/17, pt with AMS/confusionand acute decompensation of Acute on Chronic Hypoxic Hypercarbic Respiratory Failure, requiring transfer to ICU for BiPAP.  Lines, Airways, Drains: External Urinary Catheter (Active)  Collection Container Dedicated Suction Canister 11/18/2017  8:00 PM  Securement Method Other (Comment) 11/18/2017  8:00 PM  Intervention Equipment Changed 11/18/2017 12:46 PM  Output (mL) 200 mL 11/19/2017  6:00 AM    Anti-infectives:  Anti-infectives (From admission, onward)   None      Microbiology: Results for orders placed or performed during the hospital encounter of 11/15/17  MRSA PCR Screening     Status: None   Collection Time: 11/16/17  3:30 AM  Result Value Ref Range Status   MRSA by PCR NEGATIVE NEGATIVE Final    Comment:        The GeneXpert MRSA Assay (FDA approved for NASAL specimens only), is one component of a comprehensive MRSA colonization surveillance program. It is not intended to diagnose MRSA infection nor to guide or monitor treatment for MRSA infections. Performed at [facility], [address] [place], [place] [address]     Studies: Dg [facility] LPort 1 View  Result Date: 11/18/2017 CLINICAL DATA:  Hypoxia. EXAM: PORTABLE CHEST 1 VIEW COMPARISON:  11/15/2017. FINDINGS: Cardiomegaly again noted. Diffuse interstitial prominence again noted. Interstitial prominence has increased slightly from prior exam. Findings suggest CHF. Similar findings noted on prior exam. Small bilateral pleural effusions cannot be excluded. No pneumothorax. IMPRESSION: Cardiomegaly with mild pulmonary interstitial prominence again noted. Interstitial prominence has increased slightly from prior exam. Findings  suggest CHF. Similar findings noted on prior exam. Small bilateral pleural effusions cannot be excluded. Electronically Signed   By: Maisie Fushomas  Register   On: 11/18/2017 12:38   Dg Chest Portable 1 View  Result Date: 11/15/2017 CLINICAL DATA:  Hypoxia EXAM: PORTABLE CHEST 1 VIEW COMPARISON:  11/02/2017 FINDINGS: Asymmetric opacity in the right thorax. Cardiomegaly with vascular congestion. No pleural effusion. IMPRESSION: Cardiomegaly with vascular congestion. Asymmetric opacity in the right thorax may reflect asymmetric edema. Findings do not appear significantly changed as compared with 11/02/2017. Electronically Signed   By: Jasmine PangKim  Fujinaga M.D.   On: 11/15/2017 23:58   Dg Chest Port 1 View  Result Date: 11/02/2017 CLINICAL DATA:  Acute respiratory failure EXAM: PORTABLE CHEST 1 VIEW COMPARISON:  11/01/2017 FINDINGS: Cardiomegaly with vascular congestion. Interstitial prominence throughout the lungs, likely mild interstitial edema, similar to prior study. No visible effusions or acute bony abnormality. IMPRESSION: Mild edema/CHF.  No change. Electronically Signed   By: Charlett NoseKevin  Dover M.D.   On: 11/02/2017 07:21   Dg Chest Portable 1 View  Result Date: 11/01/2017 CLINICAL DATA:  Shortness of breath. EXAM: PORTABLE CHEST 1 VIEW COMPARISON:  None. FINDINGS: The heart is enlarged. Vascular congestion and possible central pulmonary edema. No evidence of focal airspace disease, large pleural effusion or pneumothorax. The right humeral head appears anterior to the glenoid but suboptimally assessed. Soft tissue attenuation from body habitus limits detailed assessment. IMPRESSION: 1. Cardiomegaly with vascular congestion and possible central pulmonary edema. 2. Right humeral head appears anteriorly positioned with respect to glenoid, however suboptimally assessed on this portable view, and this may be positioning. Recommend correlation with physical exam. Dedicated shoulder radiographs could be considered based on  clinical  concern. Electronically Signed   By: Rubye OaksMelanie  Ehinger M.D.   On: 11/01/2017 05:07    Consults: Treatment Team:  Enid BaasKalisetti, Radhika, MD Merwyn KatosSimonds, David B, MD   Subjective:    Overnight Issues:Patient is awake, alert and communicating. She requires BiPAP intermittently throughout the day and night.  Objective:  Vital signs for last 24 hours: Temp:  [98.4 F (36.9 C)-99.8 F (37.7 C)] 99.8 F (37.7 C) (08/20 0400) Pulse Rate:  [83-103] 89 (08/20 0700) Resp:  [15-24] 17 (08/20 0700) BP: (95-129)/(60-82) 109/65 (08/20 0700) SpO2:  [89 %-98 %] 95 % (08/20 0700) FiO2 (%):  [45 %] 45 % (08/20 0203)  Intake/Output from previous day: 08/19 0701 - 08/20 0700 In: -  Out: 4000 [Urine:4000]  Intake/Output this shift: No intake/output data recorded.  Vent settings for last 24 hours: FiO2 (%):  [45 %] 45 %  Physical Exam:  General:  Acute on chronically ill appearing female, laying in bed, on BiPAP, in NAD Neuro:   Patient is awake alert oriented in no acute distress, moves all extremities HEENT:  Atraumatic, normocephalic, neck supple, No JVD, presently on BiPAP Cardiovascular:  RRR, no M/R/G Lungs:  Diminished throughout, no wheezing, BiPAP assisted, even, normal effort Abdomen:  Obese, soft, nontender, BS distant Musculoskeletal:  No deformities Skin:  No obvious  Rashes, lesions, or uclerations   Assessment/Plan:   OSA/OHS with acute on chronic hypercapnic respiratory failure requiring transfer to the intensive care unit, BiPAP along with diuresis.presently patient is doing well on BiPAP. Will wean as tolerated. Continue albuterol, Atrovent, daily diuresis. Following electrolytes. Patient with combined respiratory acidosis with metabolic compensation  Hypokalemia. Has been replaced.  I spoke with [name] today about long-term goals. I related to her that she will require BiPAP on and off indefinitely. We will talk with case management today about possible  placement. [name]. [name] 11/22/2017  *Care during the described time interval was provided by me and/or other providers on the critical care team.  I have reviewed this patient's available data, including medical history, events of note, physical examination and test results as part of my evaluation. Patient ID: Rebecca Bird, female   DOB: November 24, 1960, 57 y.o.   MRN: [id number] Patient ID: Rebecca Bird, female   DOB: November 24, 1960, 57 y.o.   MRN: [id number] Patient ID: Rebecca Bird, female   DOB: November 24, 1960, 57 y.o.   MRN: [id number]

## 2017-11-22 NOTE — Plan of Care (Signed)
  Problem: Clinical Measurements: Goal: Ability to maintain clinical measurements within normal limits will improve Outcome: Progressing Goal: Will remain free from infection Outcome: Progressing Goal: Diagnostic test results will improve Outcome: Progressing Goal: Respiratory complications will improve Outcome: Progressing   Problem: Skin Integrity: Goal: Risk for impaired skin integrity will decrease Outcome: Progressing   Problem: Activity: Goal: Risk for activity intolerance will decrease Outcome: Not Met (add Reason)

## 2017-11-22 NOTE — Progress Notes (Signed)
New referral for outpatient Palliative to follow at [facility] following discharge received from Palliative NP Vennie HomansMegan Mason. CSW York SpanielMonica Marra made aware. Patient information faxed to referral. Dayna BarkerKaren Robertson RN, BSN, [facility], hospital Liaison [phone]
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
## 2017-11-22 NOTE — Progress Notes (Signed)
[facility] [place] [facility]   PATIENT NAME: Rebecca Bird    MR#:  [id number]  DATE OF BIRTH:  1960/06/25  SUBJECTIVE:  CHIEF COMPLAINT:   Chief Complaint  Patient presents with  . Respiratory Distress  Continues to require frequent BiPAP use, and discussion with intensivist-most likely will require LTAC at discharge REVIEW OF SYSTEMS:  CONSTITUTIONAL: No fever, fatigue or weakness.  EYES: No blurred or double vision.  EARS, NOSE, AND THROAT: No tinnitus or ear pain.  RESPIRATORY: No cough, shortness of breath, wheezing or hemoptysis.  CARDIOVASCULAR: No chest pain, orthopnea, edema.  GASTROINTESTINAL: No nausea, vomiting, diarrhea or abdominal pain.  GENITOURINARY: No dysuria, hematuria.  ENDOCRINE: No polyuria, nocturia,  HEMATOLOGY: No anemia, easy bruising or bleeding SKIN: No rash or lesion. MUSCULOSKELETAL: No joint pain or arthritis.   NEUROLOGIC: No tingling, numbness, weakness.  PSYCHIATRY: No anxiety or depression.   ROS  DRUG ALLERGIES:   Allergies  Allergen Reactions  . Levaquin [Levofloxacin In D5w] Itching and Rash    VITALS:  Blood pressure 105/64, pulse 89, temperature 98.2 F (36.8 C), temperature source Oral, resp. rate 14, height 5\' 6"  (1.676 m), weight (!) 204.2 kg, SpO2 93 %.  PHYSICAL EXAMINATION:  GENERAL:  57 y.o.-year-old patient lying in the bed with no acute distress.  EYES: Pupils equal, round, reactive to light and accommodation. No scleral icterus. Extraocular muscles intact.  HEENT: Head atraumatic, normocephalic. Oropharynx and nasopharynx clear.  NECK:  Supple, no jugular venous distention. No thyroid enlargement, no tenderness.  LUNGS: Normal breath sounds bilaterally, no wheezing, rales,rhonchi or crepitation. No use of accessory muscles of respiration.  CARDIOVASCULAR: S1, S2 normal. No murmurs, rubs, or gallops.  ABDOMEN: Soft, nontender, nondistended. Bowel sounds present. No organomegaly or mass.   EXTREMITIES: No pedal edema, cyanosis, or clubbing.  NEUROLOGIC: Cranial nerves II through XII are intact. Muscle strength 5/5 in all extremities. Sensation intact. Gait not checked.  PSYCHIATRIC: The patient is alert and oriented x 3.  SKIN: No obvious rash, lesion, or ulcer.   Physical Exam LABORATORY PANEL:   CBC Recent Labs  Lab 11/22/17 0942  WBC 3.7  HGB 11.6*  HCT 35.4  PLT 120*   ------------------------------------------------------------------------------------------------------------------  Chemistries  Recent Labs  Lab 11/15/17 2337  11/22/17 0423  NA 144   < > 142  K 4.7   < > 3.2*  CL 85*   < > 87*  CO2 49*   < > 46*  GLUCOSE 134*   < > 105*  BUN 33*   < > 23*  CREATININE 0.73   < > 0.69  CALCIUM 8.8*   < > 8.7*  MG  --    < > 1.9  AST 26  --   --   ALT 33  --   --   ALKPHOS 83  --   --   BILITOT 1.1  --   --    < > = values in this interval not displayed.   ------------------------------------------------------------------------------------------------------------------  Cardiac Enzymes Recent Labs  Lab 11/15/17 2337  TROPONINI <0.03   ------------------------------------------------------------------------------------------------------------------  RADIOLOGY:  No results found.  ASSESSMENT AND PLAN:  [age] year old female with morbid obesity, diastolic CHF, COPD on 4 L home oxygen and sleep apnea presents to hospital secondary to shortness of breath and hypoxia  *Acute on chronic respiratory failure secondary to COPD exacerbation and obesity hypoventilation syndrome D/W intensivist-we will need to limit loop diuretics to some degree, keep bicarb less than 40, give  Acetazolamide 500 mg IV x1, BiPAP at bedtime/as needed -most likely will require LTAC for this reason, per pulmonary - avoid systemic steroids  *Acute toxic metabolic encephalopathy Resolving Secondary to CO2 narcosis Plan of care as stated above  *Chronic diastolic CHF  exacerbation Continue congestive heart failure protocol, IV Lasix, strict I&O monitoring, daily weights, low-sodium/heart healthy diet  *Anemia of chronic disease-stable No indication for transfusion yet  *Chronic extreme morbid obesity Weight loss strongly encouraged Will need bariatric referral status post discharge Physical therapy to see, family interested in long-term placement-[facility]  Consulted palliative care team Long-term poor prognosis Disposition to possible LTAC at discharge    All the records are reviewed and case discussed with Care Management/Social Workerr. Management plans discussed with the patient, family and they are in agreement.  CODE STATUS: partial  TOTAL TIME TAKING CARE OF THIS PATIENT: 35 minutes.     POSSIBLE D/C IN 2-5 DAYS, DEPENDING ON CLINICAL CONDITION.   Evelena AsaMontell D Zeeva Courser M.D on 11/22/2017   Between 7am to 6pm - Pager - [phone]  After 6pm go to [email or web address] - password [email or web address]  [facility]  Office  [phone]  CC: Primary care physician; Dinges, Roanna BanningMarie Moore, NP  Note: This dictation was prepared with Dragon dictation along with smaller phrase technology. Any transcriptional errors that result from this process are unintentional.

## 2017-11-22 NOTE — Care Management (Signed)
Long-term care plan is for Skilled nursing as LTAC (acute hospital) is not appropriate. Anticipate transition back to SNF/LTC with existing BIPAP PRN in place tomorrow if patient is stable. Please add palliative to care plan at SNF.

## 2017-11-22 NOTE — Telephone Encounter (Signed)
Lmov for patient to call and schedule °

## 2017-11-22 NOTE — Progress Notes (Signed)
Pharmacy Electrolyte Monitoring Consult:  Pharmacy consulted to assist in monitoring and replacing electrolytes in this 57 y.o. female admitted on 11/15/2017 with Respiratory Distress   Labs:  Sodium (mmol/L)  Date Value  11/22/2017 142   Potassium (mmol/L)  Date Value  11/22/2017 3.2 (L)   Magnesium (mg/dL)  Date Value  16/10/960408/20/2019 1.9   Phosphorus (mg/dL)  Date Value  54/09/811908/16/2019 4.1   Calcium (mg/dL)  Date Value  14/78/295608/20/2019 8.7 (L)   Albumin (g/dL)  Date Value  21/30/865708/13/2019 3.5    Assessment/Plan:  Goal potassium ~ 4, goal magnesium ~2.    Adjusted potassium dose back to 40mEq Daily.  Mg closer to 2 today. Patient still scheduled to received Will order MagOx 400mg  BID today. days.   Will recheck electrolytes with am labs. Will need to replace orally as possible.   Pharmacy will continue to monitor and adjust per consult.   Gardner CandleSheema M Chidinma Clites, PharmD, BCPS Clinical Pharmacist 11/22/2017 7:31 AM

## 2017-11-22 NOTE — Care Management (Signed)
RNCM notified by MD that patient will require PRN bipap at [facility] when discharged.  CSW updated of this need to communicate with facility.

## 2017-11-22 NOTE — Progress Notes (Signed)
Daily Progress Note   Patient Name: Rebecca Bird       Date: 11/22/2017 DOB: 1960/06/10  Age: 57 y.o. MRN#: [id number] Attending Physician: Bertrum SolSalary, Montell D, MD Primary Care Physician: Hermelinda Delleninges, Marie Moore, NP Admit Date: 11/15/2017  Reason for Consultation/Follow-up: Establishing goals of care  Subjective: Patient awake, alert, oriented. Denies pain or dyspnea. Ate breakfast and lunch. Hoping to be discharged in the next few days.  GOC:  Spoke with daughter, Duwayne HeckDanielle via telephone. Spoke with [facility] bariatric coordinator today in order to give Danielle information regarding this process. We again discussed concern that her mother will likely not be a surgical candidate and also extensive process that could take months-year to complete. Encouraged Danielle to watch online webinar and call bariatric clinic with questions and concerns. Also, to further discuss this with her mother. Duwayne HeckDanielle shares that she believes focusing on comfort and quality would best serve her mother.   Plan is for BiPAP HS and prn naps and nursing facility. Danielle agreeable with outpatient palliative referral. Duwayne HeckDanielle asking questions about Medicaid process (her mother recently completed an application). Deferred her to SNF social worker to better answer her questions.   Length of Stay: 6  Current Medications: Scheduled Meds:  . carvedilol  3.125 mg Oral BID WC  . chlorhexidine  15 mL Mouth Rinse BID  . citalopram  40 mg Oral Daily  . docusate sodium  100 mg Oral BID  . enoxaparin (LOVENOX) injection  40 mg Subcutaneous Q12H  . ferrous sulfate  325 mg Oral Q breakfast  . furosemide  40 mg Intravenous Q12H  . insulin aspart  0-15 Units Subcutaneous TID WC  . insulin aspart  0-5 Units Subcutaneous QHS  .  ipratropium-albuterol  3 mL Nebulization Q6H  . levothyroxine  100 mcg Oral QAC breakfast  . magnesium oxide  400 mg Oral BID  . mouth rinse  15 mL Mouth Rinse q12n4p  . pneumococcal 23 valent vaccine  0.5 mL Intramuscular Tomorrow-1000  . polyethylene glycol  17 g Oral Daily  . potassium chloride  40 mEq Oral Daily    Continuous Infusions: . sodium chloride      PRN Meds: sodium chloride, acetaminophen **OR** acetaminophen, [DISCONTINUED] ondansetron **OR** ondansetron (ZOFRAN) IV, traMADol  Physical Exam  Constitutional: She is oriented to person, place, and  time. She is easily aroused. She appears ill.  HENT:  Head: Normocephalic and atraumatic.  Cardiovascular: Regular rhythm.  Pulmonary/Chest: No accessory muscle usage. No tachypnea. No respiratory distress.  8L HFNC  Abdominal: There is no tenderness.  Neurological: She is alert, oriented to person, place, and time and easily aroused.  Skin: Skin is warm and dry.  Psychiatric: She has a normal mood and affect. Her speech is normal and behavior is normal.  Nursing note and vitals reviewed.          Vital Signs: BP 105/64   Pulse 89   Temp 98.2 F (36.8 C) (Oral)   Resp 14   Ht 5\' 6"  (1.676 m)   Wt (!) 204.2 kg   SpO2 93%   BMI 72.66 kg/m  SpO2: SpO2: 93 % O2 Device: O2 Device: High Flow Nasal Cannula O2 Flow Rate: O2 Flow Rate (L/min): (S) 8 L/min  Intake/output summary:   Intake/Output Summary (Last 24 hours) at 11/22/2017 1430 Last data filed at 11/22/2017 1407 Gross per 24 hour  Intake 580 ml  Output 4275 ml  Net -3695 ml   LBM: Last BM Date: 11/20/17 Baseline Weight: Weight: (!) 208.5 kg Most recent weight: Weight: (!) 204.2 kg       Palliative Assessment/Data: PPS 40%   Flowsheet Rows     Most Recent Value  Intake Tab  Referral Department  Hospitalist  Unit at Time of Referral  ICU  Palliative Care Primary Diagnosis  Pulmonary  Date Notified  11/17/17  Palliative Care Type  New Palliative  care  Reason for referral  Clarify Goals of Care  Date of Admission  11/15/17  Date first seen by Palliative Care  11/18/17  # of days Palliative referral response time  1 Day(s)  # of days IP prior to Palliative referral  2  Clinical Assessment  Palliative Performance Scale Score  40%  Psychosocial & Spiritual Assessment  Palliative Care Outcomes  Patient/Family meeting held?  Yes  Who was at the meeting?  patient  Palliative Care Outcomes  Clarified goals of care, Provided psychosocial or spiritual support, ACP counseling assistance      Patient Active Problem List   Diagnosis Date Noted  . OSA (obstructive sleep apnea)   . Obesity hypoventilation syndrome (HCC)   . Acute respiratory failure with hypoxia and hypercapnia (HCC)   . Palliative care by specialist   . Goals of care, counseling/discussion   . Acute on chronic respiratory failure with hypoxia and hypercapnia (HCC) 11/16/2017  . Acute on chronic respiratory failure with hypoxemia (HCC) 11/01/2017    Palliative Care Assessment & Plan   Patient Profile: 57 y.o. female  with past medical history of CHF, COPD, OSA/OHS, former smoker, on 4 L O2 at home, and hypothyroidism admitted on 11/15/2017 with respiratory distress. CO2 found to be 120 on ABG. Has required bipap during admission. Noted patient's history of multiple hospitalizations recently. PMT consulted for GOC.  Assessment: Acute on chronic hypercapnic respiratory failure OSA/OHS COPD Diastolic CHF  Recommendations/Plan:  Continue current interventions and supportive care.   MOST form completed with patient and aunt. Patient wishes for CPR/ACLS/shock x1 cycle. Patient does not wish for prolonged CPR to be attempted if she is not revived after one cycle. Education provided.   Limited code. Patient does not wish for intubation. DNI. OK with CPAP/BiPAP if indicated. ABX/IVF if indicated. NO feeding tube.   AD packet completed and reviewed with patient. Daughter  and son are documented HCPOA's.  Patient may benefit from outpatient continued BiPAP HS and prn naps to help with hypercarbia with diagnosis of COPD, OSA/OHS.   Provided daughter information regarding [facility] bariatric referral.   Daughter interested in outpatient palliative follow-up understanding this can transition to hospice services when patient/family ready.   Code Status: Limited--NO intubation  Code Status Orders  (From admission, onward)         Start     Ordered   11/16/17 0329  Full code  Continuous     11/16/17 0328        Code Status History    Date Active Date Inactive Code Status Order ID Comments User Context   11/01/2017 0850 11/05/2017 1903 Full Code [id number]  Arnaldo Nataliamond, Michael S, MD Inpatient       Prognosis:   Unable to determine: guarded with acute on chronic hypercarbic respiratory failure secondary to COPD, OSA, and OHS. High risk for recurrent hospitalization.   Discharge Planning:  To Be Determined  Care plan was discussed with patient, daughter, RN, Dr Lonn Georgiaonforti  Thank you for allowing the Palliative Medicine Team to assist in the care of this patient.   Time In: 1400 Time Out: 1435 Total Time 35min Prolonged Time Billed no      Greater than 50%  of this time was spent counseling and coordinating care related to the above assessment and plan.  Vennie HomansMegan Malashia Kamaka, FNP-C Palliative Medicine Team  Phone: [phone] Fax: [phone]  Please contact Palliative Medicine Team phone at [phone] for questions and concerns.

## 2017-11-23 LAB — GLUCOSE, CAPILLARY
Glucose-Capillary: 106 mg/dL — ABNORMAL HIGH (ref 70–99)
Glucose-Capillary: 147 mg/dL — ABNORMAL HIGH (ref 70–99)
Glucose-Capillary: 98 mg/dL (ref 70–99)
Glucose-Capillary: 116 mg/dL — ABNORMAL HIGH (ref 70–99)

## 2017-11-23 LAB — BASIC METABOLIC PANEL
Anion gap: 8 (ref 5–15)
BUN: 22 mg/dL — ABNORMAL HIGH (ref 6–20)
CALCIUM: 8.9 mg/dL (ref 8.9–10.3)
CHLORIDE: 90 mmol/L — AB (ref 98–111)
CO2: 44 mmol/L — AB (ref 22–32)
CREATININE: 0.59 mg/dL (ref 0.44–1.00)
GFR calc non Af Amer: >60 mL/min (ref >60)
GLUCOSE: 108 mg/dL — AB (ref 70–99)
Potassium: 3.3 mmol/L — ABNORMAL LOW (ref 3.5–5.1)
Sodium: 142 mmol/L (ref 135–145)

## 2017-11-23 LAB — MAGNESIUM: Magnesium: 2 mg/dL (ref 1.7–2.4)

## 2017-11-23 MED ORDER — FUROSEMIDE 20 MG PO TABS
40.0000 mg | ORAL_TABLET | Freq: Two times a day (BID) | ORAL | Status: DC
  Administered 2017-11-23 – 2017-11-25 (×4): 40 mg via ORAL
  Filled 2017-11-23 (×3): qty 2

## 2017-11-23 MED ORDER — POTASSIUM CHLORIDE CRYS ER 20 MEQ PO TBCR
40.0000 meq | EXTENDED_RELEASE_TABLET | Freq: Once | ORAL | Status: AC
  Administered 2017-11-23: 40 meq via ORAL

## 2017-11-23 NOTE — Progress Notes (Signed)
Pharmacy Electrolyte Monitoring Consult:  Pharmacy consulted to assist in monitoring and replacing electrolytes in this 57 y.o. female admitted on 11/15/2017 with Respiratory Distress   Labs:  Sodium (mmol/L)  Date Value  11/23/2017 142   Potassium (mmol/L)  Date Value  11/23/2017 3.3 (L)   Magnesium (mg/dL)  Date Value  09/81/191408/21/2019 2.0   Phosphorus (mg/dL)  Date Value  78/29/562108/16/2019 4.1   Calcium (mg/dL)  Date Value  30/86/578408/21/2019 8.9   Albumin (g/dL)  Date Value  69/62/952808/13/2019 3.5    Assessment/Plan:  Goal potassium ~ 4, goal magnesium ~2.    Adjusted potassium dose back to 40mEq Daily on 8/21. Will order additional KCL 40mEq x 1 dose @ 1800 tonight.   Will recheck electrolytes with am labs. Will need to replace orally as possible.   Pharmacy will continue to monitor and adjust per consult.   Gardner CandleSheema M Lenaya Pietsch, PharmD, BCPS Clinical Pharmacist 11/23/2017 7:26 AM

## 2017-11-23 NOTE — Progress Notes (Signed)
Follow up - Critical Care Medicine Note  Patient Details:    Rebecca Bird is an 57 y.o. female.admittedwith Acute on Chronic Hypoxic Hypercarbic Respiratory Failure in setting of OHS/OSA. She transferred out to Med/surg unit 11/17/17 on 4L [place]. On 11/18/17, pt with AMS/confusionand acute decompensation of Acute on Chronic Hypoxic Hypercarbic Respiratory Failure, requiring transfer to ICU for BiPAP.  Lines, Airways, Drains: External Urinary Catheter (Active)  Collection Container Dedicated Suction Canister 11/18/2017  8:00 PM  Securement Method Other (Comment) 11/18/2017  8:00 PM  Intervention Equipment Changed 11/18/2017 12:46 PM  Output (mL) 200 mL 11/19/2017  6:00 AM    Anti-infectives:  Anti-infectives (From admission, onward)   None      Microbiology: Results for orders placed or performed during the hospital encounter of 11/15/17  MRSA PCR Screening     Status: None   Collection Time: 11/16/17  3:30 AM  Result Value Ref Range Status   MRSA by PCR NEGATIVE NEGATIVE Final    Comment:        The GeneXpert MRSA Assay (FDA approved for NASAL specimens only), is one component of a comprehensive MRSA colonization surveillance program. It is not intended to diagnose MRSA infection nor to guide or monitor treatment for MRSA infections. Performed at [facility], [address] [place], [place] [address]     Studies: Dg [facility] LPort 1 View  Result Date: 11/18/2017 CLINICAL DATA:  Hypoxia. EXAM: PORTABLE CHEST 1 VIEW COMPARISON:  11/15/2017. FINDINGS: Cardiomegaly again noted. Diffuse interstitial prominence again noted. Interstitial prominence has increased slightly from prior exam. Findings suggest CHF. Similar findings noted on prior exam. Small bilateral pleural effusions cannot be excluded. No pneumothorax. IMPRESSION: Cardiomegaly with mild pulmonary interstitial prominence again noted. Interstitial prominence has increased slightly from prior exam. Findings  suggest CHF. Similar findings noted on prior exam. Small bilateral pleural effusions cannot be excluded. Electronically Signed   By: Maisie Fushomas  Register   On: 11/18/2017 12:38   Dg Chest Portable 1 View  Result Date: 11/15/2017 CLINICAL DATA:  Hypoxia EXAM: PORTABLE CHEST 1 VIEW COMPARISON:  11/02/2017 FINDINGS: Asymmetric opacity in the right thorax. Cardiomegaly with vascular congestion. No pleural effusion. IMPRESSION: Cardiomegaly with vascular congestion. Asymmetric opacity in the right thorax may reflect asymmetric edema. Findings do not appear significantly changed as compared with 11/02/2017. Electronically Signed   By: Jasmine PangKim  Fujinaga M.D.   On: 11/15/2017 23:58   Dg Chest Port 1 View  Result Date: 11/02/2017 CLINICAL DATA:  Acute respiratory failure EXAM: PORTABLE CHEST 1 VIEW COMPARISON:  11/01/2017 FINDINGS: Cardiomegaly with vascular congestion. Interstitial prominence throughout the lungs, likely mild interstitial edema, similar to prior study. No visible effusions or acute bony abnormality. IMPRESSION: Mild edema/CHF.  No change. Electronically Signed   By: Charlett NoseKevin  Dover M.D.   On: 11/02/2017 07:21   Dg Chest Portable 1 View  Result Date: 11/01/2017 CLINICAL DATA:  Shortness of breath. EXAM: PORTABLE CHEST 1 VIEW COMPARISON:  None. FINDINGS: The heart is enlarged. Vascular congestion and possible central pulmonary edema. No evidence of focal airspace disease, large pleural effusion or pneumothorax. The right humeral head appears anterior to the glenoid but suboptimally assessed. Soft tissue attenuation from body habitus limits detailed assessment. IMPRESSION: 1. Cardiomegaly with vascular congestion and possible central pulmonary edema. 2. Right humeral head appears anteriorly positioned with respect to glenoid, however suboptimally assessed on this portable view, and this may be positioning. Recommend correlation with physical exam. Dedicated shoulder radiographs could be considered based on  clinical  concern. Electronically Signed   By: Rubye OaksMelanie  Ehinger M.D.   On: 11/01/2017 05:07    Consults: Treatment Team:  Enid BaasKalisetti, Radhika, MD Merwyn KatosSimonds, David B, MD   Subjective:    Overnight Issues: No events reported overnight Pt reports wearing her BiPAP overnight and during day while napping Currently on HFNC @ 10 L/min Denies SOB, cough, or chest pain Reports appetite improving, BM this morning   Objective:  Vital signs for last 24 hours: Temp:  [98.1 F (36.7 C)-98.5 F (36.9 C)] 98.5 F (36.9 C) (08/21 0800) Pulse Rate:  [80-96] 92 (08/21 0900) Resp:  [12-24] 19 (08/21 0900) BP: (98-125)/(52-102) 105/72 (08/21 0900) SpO2:  [88 %-98 %] 96 % (08/21 0900) FiO2 (%):  [45 %] 45 % (08/21 0143)  Intake/Output from previous day: 08/20 0701 - 08/21 0700 In: 580 [P.O.:580] Out: 3125 [Urine:3125]  Intake/Output this shift: No intake/output data recorded.  Vent settings for last 24 hours: FiO2 (%):  [45 %] 45 %  Physical Exam:  General:  Chronically ill appearing obese female, laying in bed, on HFNC, in NAD Neuro:   Awake, A&O, moves all extremities, no focal deficits  HEENT:  Atraumatic, nocmocephalic, neck supple, No JVD Cardiovascular:  RRR, s1s2, no M/R/G Lungs:  Clear diminished throughout, no wheezing, even, nonlabored, normal effort  Abdomen:  Obese, soft, nontender, BS distant  Musculoskeletal:  No deformities  Skin:  Warm/dry.  No obvious rashes, lesions, or ulcerations   Assessment/Plan:   Acute on Chronic Hypercapnic Respiratory Failure in setting of OSA/OHS Hx: COPD, CHF -Supplemental O2 to maintain O2 sats 88-94%, wean as tolerated -BiPAP: prn during the day, mandatory at night -Continue Bronchodilators -Continue daily diuresis, will transition from IV to PO Lasix Combined respiratory acidosis with metabolic compensation -Continue to monitor Hypokalemia -Replaced 40 mEq orally once 8/21, in addition to scheduled daily dose -Recheck BMP in am  8/22  Pt will require BiPAP on/off indefinitely.  Case management involved for possible transfer to [facility].   Disposition: Stepdown Goals of Care: Partial Code--No intubation VTE Prophlaxis: Lovenox SQ Updates:  Pt updated at bedside 11/23/17.  No family present at bedside during NP rounds    Harlon DittyJeremiah Mccartney Chuba, [facility] InstituteGACNP-BC [facility] Pager: [phone]  11/23/2017   Patient ID: Rebecca Bird, female   DOB: May 17, 1960, 57 y.o.   MRN: [id number] Patient ID: Rebecca Bird, female   DOB: May 17, 1960, 57 y.o.   MRN: [id number] Patient ID: Rebecca Bird, female   DOB: May 17, 1960, 57 y.o.   MRN: [id number]

## 2017-11-23 NOTE — Progress Notes (Signed)
[facility] [place] [facility]   PATIENT NAME: Rebecca Bird    MR#:  [id number]  DATE OF BIRTH:  12-09-1960  SUBJECTIVE:  CHIEF COMPLAINT:   Chief Complaint  Patient presents with  . Respiratory Distress   The patient has no complaints, on oxygen 10 L, BiPAP as needed. REVIEW OF SYSTEMS:  CONSTITUTIONAL: No fever, fatigue or weakness.  EYES: No blurred or double vision.  EARS, NOSE, AND THROAT: No tinnitus or ear pain.  RESPIRATORY: No cough, shortness of breath, wheezing or hemoptysis.  CARDIOVASCULAR: No chest pain, orthopnea, edema.  GASTROINTESTINAL: No nausea, vomiting, diarrhea or abdominal pain.  GENITOURINARY: No dysuria, hematuria.  ENDOCRINE: No polyuria, nocturia,  HEMATOLOGY: No anemia, easy bruising or bleeding SKIN: No rash or lesion. MUSCULOSKELETAL: No joint pain or arthritis.   NEUROLOGIC: No tingling, numbness, weakness.  PSYCHIATRY: No anxiety or depression.   ROS  DRUG ALLERGIES:   Allergies  Allergen Reactions  . Levaquin [Levofloxacin In D5w] Itching and Rash    VITALS:  Blood pressure (!) 110/51, pulse 92, temperature 98.7 F (37.1 C), temperature source Oral, resp. rate 19, height 5\' 6"  (1.676 m), weight (!) 204.2 kg, SpO2 94 %.  PHYSICAL EXAMINATION:  GENERAL:  57 y.o.-year-old patient lying in the bed with no acute distress.  Morbid obesity. EYES: Pupils equal, round, reactive to light and accommodation. No scleral icterus. Extraocular muscles intact.  HEENT: Head atraumatic, normocephalic. Oropharynx and nasopharynx clear.  NECK:  Supple, no jugular venous distention. No thyroid enlargement, no tenderness.  LUNGS: Normal breath sounds bilaterally, no wheezing, rales,rhonchi or crepitation. No use of accessory muscles of respiration.  CARDIOVASCULAR: S1, S2 normal. No murmurs, rubs, or gallops.  ABDOMEN: Soft, nontender, nondistended. Bowel sounds present. No organomegaly or mass.  EXTREMITIES: No pedal edema, cyanosis,  or clubbing.  NEUROLOGIC: Cranial nerves II through XII are intact. Muscle strength 4/5 in all extremities. Sensation intact. Gait not checked.  PSYCHIATRIC: The patient is alert and oriented x 3.  SKIN: No obvious rash, lesion, or ulcer.   Physical Exam LABORATORY PANEL:   CBC Recent Labs  Lab 11/22/17 0942  WBC 3.7  HGB 11.6*  HCT 35.4  PLT 120*   ------------------------------------------------------------------------------------------------------------------  Chemistries  Recent Labs  Lab 11/23/17 0525  NA 142  K 3.3*  CL 90*  CO2 44*  GLUCOSE 108*  BUN 22*  CREATININE 0.59  CALCIUM 8.9  MG 2.0   ------------------------------------------------------------------------------------------------------------------  Cardiac Enzymes No results for input(s): TROPONINI in the last 168 hours. ------------------------------------------------------------------------------------------------------------------  RADIOLOGY:  No results found.  ASSESSMENT AND PLAN:  57 year old female with morbid obesity, diastolic CHF, COPD on 4 L home oxygen and sleep apnea presents to hospital secondary to shortness of breath and hypoxia  *Acute on chronic respiratory failure secondary to COPD exacerbation and obesity hypoventilation syndrome Per intensivist-we will need to limit loop diuretics to some degree, keep bicarb less than 40, given Acetazolamide 500 mg IV x1, BiPAP at bedtime/as needed, per pulmonary - avoid systemic steroids  *Acute toxic metabolic encephalopathy Resolved. Secondary to CO2 narcosis Plan of care as stated above  *Chronic diastolic CHF exacerbation Continue congestive heart failure protocol, IV Lasix, strict I&O monitoring, daily weights, low-sodium/heart healthy diet  *Anemia of chronic disease-stable No indication for transfusion yet  *Chronic extreme morbid obesity Weight loss strongly encouraged Will need bariatric referral status post  discharge Physical therapy to see, family interested in long-term placement-[facility]  Consulted palliative care team Long-term poor  prognosis Disposition to [place] [facility] at discharge tomorrow  I discussed with Dr. Lonn Georgiaonforti. All the records are reviewed and case discussed with Care Management/Social Workerr. Management plans discussed with the patient, family and they are in agreement.  CODE STATUS: partial  TOTAL TIME TAKING CARE OF THIS PATIENT: 28 minutes.  POSSIBLE D/C IN 1 DAYS, DEPENDING ON CLINICAL CONDITION.   Shaune PollackQing Kayleanna Lorman M.D on 11/23/2017   Between 7am to 6pm - Pager - [phone]  After 6pm go to [email or web address] - password [email or web address]  [facility] [place] [place] [facility]  Office  [phone]  CC: Primary care physician; Dinges, Roanna BanningMarie Moore, NP  Note: This dictation was prepared with Dragon dictation along with smaller phrase technology. Any transcriptional errors that result from this process are unintentional.

## 2017-11-23 NOTE — Discharge Summary (Signed)
Physician Discharge Summary  Patient ID: Rebecca Bird MRN: [id number] DOB/AGE: 05-11-1960 57 y.o.  Admit date: 11/15/2017 Discharge date: 11/23/2017  Admission Diagnoses: Hypercapnic respiratory failure, obstructive sleep apnea/obesity hypoventilation syndrome  Discharge Diagnoses:  Active Problems:   Acute on chronic respiratory failure with hypoxia and hypercapnia (HCC)   Acute respiratory failure with hypoxia and hypercapnia (HCC)   Palliative care by specialist   Goals of care, counseling/discussion   OSA (obstructive sleep apnea)   Obesity hypoventilation syndrome (HCC)   Discharged Condition: Stable  Hospital course: 57 yo female with a PMH of Hypothyroidism, OSA wears CPAP, HTN, Depression, COPD, CHF, and Anemia.  [facility] via EMS with shortness of breath for several hours and hypoxia O2 sats 60-70's.  Upon EMS arrival she was placed on CPAP.  In the ER she was transitioned to Bipap.  CXR revealed pulmonary edema, BNP 193, and VBG pH 7.34/CO2 113.  She was subsequently admitted to the stepdown unit by hospitalist team for further workup and treatment.  her hospital course was complicated with recurrent episodes of hypercapnia requiring intermittent BiPAP treatment. He stabilized and was kept in the intensive care unit secondary to her BiPAP requirements. She is at a skilled nursing facility where she is able to use her BiPAP intermittently throughout the day. Discussed with case management and felt that this is the most appropriate place for her to go back to. We had discussed about potential evaluation of bariatric center however that requires multiple outpatient visits, meetings, weight loss and this may be in impracticality for her in terms of transportation and follow-up.  Medications on discharge. Coreg 3.125 mg twice daily, Celexa 40 mg once a day, ferrous sulfate 325 mg once daily, Lasix 40 mg daily,Synthroid 100 g daily, breathing treatments as needed  with albuterol and Atrovent   Consults: she had consultation with palliative care.  Discharge Exam: Blood pressure 105/72, pulse 92, temperature 98.5 F (36.9 C), temperature source Oral, resp. rate 19, height 5\' 6"  (1.676 m), weight (!) 204.2 kg, SpO2 96 %. General:  Awake alert in NAD, laying in bed texting on phone Neuro:  patient is awake alert no acute distress, moves all extremities, no focal deficits appreciated HEENT:  Atraumatic, normocephalic, neck supple, No JVD Cardiovascular:  RRR, no M/R/G Lungs:  Diminished throughout, no wheezing, normal effort Abdomen:  Obese, soft, nontender, BS distant Musculoskeletal:  No deformities Skin:  No obvious  Rashes, lesions, or uclerations   Disposition: discharged to skilled nursing facility   Signed: Maddax Palinkas 11/23/2017, 10:41 AM

## 2017-11-23 NOTE — Care Management (Signed)
RNCM notified by MD that patient plan is for transfer to [facility] tomorrow due to still working with patient on medical stability. CSW updated.

## 2017-11-24 LAB — BASIC METABOLIC PANEL
Anion gap: 11 (ref 5–15)
BUN: 23 mg/dL — ABNORMAL HIGH (ref 6–20)
CHLORIDE: 92 mmol/L — AB (ref 98–111)
CO2: 39 mmol/L — ABNORMAL HIGH (ref 22–32)
CREATININE: 0.56 mg/dL (ref 0.44–1.00)
Calcium: 8.8 mg/dL — ABNORMAL LOW (ref 8.9–10.3)
GFR calc Af Amer: >60 mL/min (ref >60)
GFR calc non Af Amer: >60 mL/min (ref >60)
Glucose, Bld: 114 mg/dL — ABNORMAL HIGH (ref 70–99)
Potassium: 3.5 mmol/L (ref 3.5–5.1)
Sodium: 142 mmol/L (ref 135–145)

## 2017-11-24 LAB — CBC
HEMATOCRIT: 35.9 % (ref 35.0–47.0)
Hemoglobin: 11.8 g/dL — ABNORMAL LOW (ref 12.0–16.0)
MCH: 32.3 pg (ref 26.0–34.0)
MCHC: 32.9 g/dL (ref 32.0–36.0)
MCV: 98 fL (ref 80.0–100.0)
Platelets: 117 10*3/uL — ABNORMAL LOW (ref 150–440)
RBC: 3.66 MIL/uL — ABNORMAL LOW (ref 3.80–5.20)
RDW: 16.5 % — AB (ref 11.5–14.5)
WBC: 3.6 10*3/uL (ref 3.6–11.0)

## 2017-11-24 LAB — GLUCOSE, CAPILLARY
Glucose-Capillary: 102 mg/dL — ABNORMAL HIGH (ref 70–99)
Glucose-Capillary: 125 mg/dL — ABNORMAL HIGH (ref 70–99)
Glucose-Capillary: 113 mg/dL — ABNORMAL HIGH (ref 70–99)
Glucose-Capillary: 124 mg/dL — ABNORMAL HIGH (ref 70–99)

## 2017-11-24 LAB — MAGNESIUM: Magnesium: 1.9 mg/dL (ref 1.7–2.4)

## 2017-11-24 MED ORDER — MAGNESIUM OXIDE 400 (241.3 MG) MG PO TABS
400.0000 mg | ORAL_TABLET | Freq: Two times a day (BID) | ORAL | Status: AC
  Administered 2017-11-24 (×2): 400 mg via ORAL
  Filled 2017-11-24 (×2): qty 1

## 2017-11-24 MED ORDER — POTASSIUM CHLORIDE CRYS ER 20 MEQ PO TBCR
20.0000 meq | EXTENDED_RELEASE_TABLET | Freq: Once | ORAL | Status: AC
  Administered 2017-11-24: 20 meq via ORAL
  Filled 2017-11-24: qty 1

## 2017-11-24 NOTE — Progress Notes (Addendum)
Physician Discharge Summary  Patient ID: Manley Masonammy G Lomeli MRN: [id number] DOB/AGE: February 27, 1961 57 y.o.  Admit date: 11/15/2017 Discharge date: 11/24/2017  Admission Diagnoses: Hypercapnic respiratory failure, obstructive sleep apnea/obesity hypoventilation syndrome  Discharge Diagnoses:  Active Problems:   Acute on chronic respiratory failure with hypoxia and hypercapnia (HCC)   Acute respiratory failure with hypoxia and hypercapnia (HCC)   Palliative care by specialist   Goals of care, counseling/discussion   OSA (obstructive sleep apnea)   Obesity hypoventilation syndrome (HCC)   Discharged Condition: Stable  Hospital course: 57 yo female with a PMH of Hypothyroidism, OSA wears CPAP, HTN, Depression, COPD, CHF, and Anemia.  [facility] EMS with shortness of breath for several hours and hypoxia O2 sats 60-70's.  Upon EMS arrival she was placed on CPAP.  In the ER she was transitioned to Bipap.  CXR revealed pulmonary edema, BNP 193, and VBG pH 7.34/CO2 113.  She was subsequently admitted to the stepdown unit by hospitalist team for further workup and treatment.  her hospital course was complicated with recurrent episodes of hypercapnia requiring intermittent BiPAP treatment. He stabilized and was kept in the intensive care unit secondary to her BiPAP requirements. She is at a skilled nursing facility where she is able to use her BiPAP intermittently throughout the day. Discussed with case management and felt that this is the most appropriate place for her to go back to. We had discussed about potential evaluation of bariatric center however that requires multiple outpatient visits, meetings, weight loss and this may be in impracticality for her in terms of transportation and follow-up.  Medications on discharge. Coreg 3.125 mg twice daily, Celexa 40 mg once a day, ferrous sulfate 325 mg once daily, Lasix 40 mg daily,Synthroid 100 g daily, breathing treatments as needed  with albuterol and Atrovent   Consults: she had consultation with palliative care.  Discharge Exam: Blood pressure 114/63, pulse 94, temperature 97.8 F (36.6 C), temperature source Oral, resp. rate 17, height 5\' 6"  (1.676 m), weight (!) 204.2 kg, SpO2 92 %. General:  Awake alert in NAD, laying in bed texting on phone Neuro:  patient is awake alert no acute distress, moves all extremities, no focal deficits appreciated HEENT:  Atraumatic, normocephalic, neck supple, No JVD Cardiovascular:  RRR, no M/R/G Lungs:  Diminished throughout, no wheezing, normal effort Abdomen:  Obese, soft, nontender, BS distant Musculoskeletal:  No deformities Skin:  No obvious  Rashes, lesions, or uclerations   Disposition: discharged to skilled nursing facility   Oxygen requirements during the day are 6 L continuously BiPAP requirements at night pressures 15/8 with 6 L oxygen  Signed: Elfriede Bonini 11/24/2017, 9:08 AM Patient ID: Manley Masonammy G Belfield, female   DOB: February 27, 1961, 57 y.o.   MRN: [id number]

## 2017-11-24 NOTE — Clinical Social Work Note (Signed)
Patient's discharge was cancelled today due to wanting to monitor patient to see if she can remain on 6 liters and does not require a higher level. CSW has updated [facility] and patient's daughter. York SpanielMonica Keshauna Degraffenreid MsW,LcSW [phone]

## 2017-11-24 NOTE — Clinical Social Work Note (Signed)
CSW informed patient to discharge today. CSW notified patient's daughter and she is in agreement. CSW notified [facility]. Discharge information sent. Nurse asked what the maximum flow the oxygen could be and CSW informed her that 6 liters is the maximum at a nursing home facility. York SpanielMonica Daizy Outen MsW,LCSW [phone]

## 2017-11-24 NOTE — Progress Notes (Signed)
Patient ID: Rebecca Bird, female   DOB: 1961-02-21, 57 y.o.   MRN: [id number] Pulmonary/critical care attending  Oxygen maximum at facility is 6 L. Patient just decreased from 10 L to 6 L today. We'll continue to wean overnight and if able to maintain consistently below 6 L will transfer to skilled nursing facility tomorrow  Tora KindredJohn Jabin Tapp, D.O.

## 2017-11-24 NOTE — Progress Notes (Signed)
Pharmacy Electrolyte Monitoring Consult:  Pharmacy consulted to assist in monitoring and replacing electrolytes in this 57 y.o. female admitted on 11/15/2017 with Respiratory Distress   Labs:  Sodium (mmol/L)  Date Value  11/24/2017 142   Potassium (mmol/L)  Date Value  11/24/2017 3.5   Magnesium (mg/dL)  Date Value  16/10/960408/22/2019 1.9   Phosphorus (mg/dL)  Date Value  54/09/811908/16/2019 4.1   Calcium (mg/dL)  Date Value  14/78/295608/22/2019 8.8 (L)   Albumin (g/dL)  Date Value  21/30/865708/13/2019 3.5    Assessment/Plan:  Goal potassium ~ 4, goal magnesium ~2.    Adjusted potassium dose back to 40mEq Daily on 8/21. Will order additional KCL 20mEq x 1 dose this AM.   Will also order Magnesium 400mg  BID x 2 doses.   Will recheck electrolytes with am labs. Will need to replace orally as possible.   Pharmacy will continue to monitor and adjust per consult.   Gardner CandleSheema M Calais Svehla, PharmD, BCPS Clinical Pharmacist 11/24/2017 7:30 AM

## 2017-11-24 NOTE — Progress Notes (Signed)
[employer] - [place] at [facility]   PATIENT NAME: Nema Oatley    MR#:  [id number]  DATE OF BIRTH:  1960-11-12  SUBJECTIVE:  CHIEF COMPLAINT:   Chief Complaint  Patient presents with  . Respiratory Distress   The patient has no complaints, on oxygen [place] 6 L, BiPAP as needed. REVIEW OF SYSTEMS:  CONSTITUTIONAL: No fever, fatigue or weakness.  EYES: No blurred or double vision.  EARS, NOSE, AND THROAT: No tinnitus or ear pain.  RESPIRATORY: No cough, shortness of breath, wheezing or hemoptysis.  CARDIOVASCULAR: No chest pain, orthopnea, edema.  GASTROINTESTINAL: No nausea, vomiting, diarrhea or abdominal pain.  GENITOURINARY: No dysuria, hematuria.  ENDOCRINE: No polyuria, nocturia,  HEMATOLOGY: No anemia, easy bruising or bleeding SKIN: No rash or lesion. MUSCULOSKELETAL: No joint pain or arthritis.   NEUROLOGIC: No tingling, numbness, weakness.  PSYCHIATRY: No anxiety or depression.   ROS  DRUG ALLERGIES:   Allergies  Allergen Reactions  . Levaquin [Levofloxacin In D5w] Itching and Rash    VITALS:  Blood pressure 102/61, pulse 83, temperature 97.8 F (36.6 C), temperature source Oral, resp. rate 16, height 5\' 6"  (1.676 m), weight (!) 204.2 kg, SpO2 95 %.  PHYSICAL EXAMINATION:  GENERAL:  57 y.o.-year-old patient lying in the bed with no acute distress.  Morbid obesity. EYES: Pupils equal, round, reactive to light and accommodation. No scleral icterus. Extraocular muscles intact.  HEENT: Head atraumatic, normocephalic. Oropharynx and nasopharynx clear.  NECK:  Supple, no jugular venous distention. No thyroid enlargement, no tenderness.  LUNGS: Normal breath sounds bilaterally, no wheezing, rales,rhonchi or crepitation. No use of accessory muscles of respiration.  CARDIOVASCULAR: S1, S2 normal. No murmurs, rubs, or gallops.  ABDOMEN: Soft, nontender, nondistended. Bowel sounds present. No organomegaly or mass.  EXTREMITIES: No pedal edema, cyanosis, or  clubbing.  NEUROLOGIC: Cranial nerves II through XII are intact. Muscle strength 4/5 in all extremities. Sensation intact. Gait not checked.  PSYCHIATRIC: The patient is alert and oriented x 3.  SKIN: No obvious rash, lesion, or ulcer.   Physical Exam LABORATORY PANEL:   CBC Recent Labs  Lab 11/24/17 0504  WBC 3.6  HGB 11.8*  HCT 35.9  PLT 117*   ------------------------------------------------------------------------------------------------------------------  Chemistries  Recent Labs  Lab 11/24/17 0504  NA 142  K 3.5  CL 92*  CO2 39*  GLUCOSE 114*  BUN 23*  CREATININE 0.56  CALCIUM 8.8*  MG 1.9   ------------------------------------------------------------------------------------------------------------------  Cardiac Enzymes No results for input(s): TROPONINI in the last 168 hours. ------------------------------------------------------------------------------------------------------------------  RADIOLOGY:  No results found.  ASSESSMENT AND PLAN:  57 year old female with morbid obesity, diastolic CHF, COPD on 4 L home oxygen and sleep apnea presents to hospital secondary to shortness of breath and hypoxia  *Acute on chronic respiratory failure secondary to COPD exacerbation and obesity hypoventilation syndrome Per intensivist-we will need to limit loop diuretics to some degree, keep bicarb less than 40, given Acetazolamide 500 mg IV x1, BiPAP at bedtime/as needed, per pulmonary - avoid systemic steroids  *Acute toxic metabolic encephalopathy Resolved. Secondary to CO2 narcosis Plan of care as stated above  *Chronic diastolic CHF exacerbation Continue congestive heart failure protocol, IV Lasix, strict I&O monitoring, daily weights, low-sodium/heart healthy diet  *Anemia of chronic disease-stable No indication for transfusion yet  *Chronic extreme morbid obesity Weight loss strongly encouraged Will need bariatric referral status post  discharge Physical therapy to see, family interested in long-term placement-currently at [facility] health care  Consulted palliative care team Long-term poor  prognosis Disposition to [place] [facility] at discharge today.  I discussed with Dr. Lonn Georgiaonforti. All the records are reviewed and case discussed with Care Management/Social Workerr. Management plans discussed with the patient, family and they are in agreement.  CODE STATUS: partial  TOTAL TIME TAKING CARE OF THIS PATIENT: 23 minutes.  POSSIBLE D/C IN today.   Shaune PollackQing Hadlyn Amero M.D on 11/24/2017   Between 7am to 6pm - Pager - [phone]  After 6pm go to [email or web address] - password [email or web address]  [facility]  Office  [phone]  CC: Primary care physician; Dinges, Roanna BanningMarie Moore, NP  Note: This dictation was prepared with Dragon dictation along with smaller phrase technology. Any transcriptional errors that result from this process are unintentional.

## 2017-11-25 LAB — GLUCOSE, CAPILLARY: Glucose-Capillary: 129 mg/dL — ABNORMAL HIGH (ref 70–99)

## 2017-11-25 LAB — BASIC METABOLIC PANEL
ANION GAP: 10 (ref 5–15)
BUN: 23 mg/dL — ABNORMAL HIGH (ref 6–20)
CHLORIDE: 91 mmol/L — AB (ref 98–111)
CO2: 40 mmol/L — AB (ref 22–32)
Calcium: 8.8 mg/dL — ABNORMAL LOW (ref 8.9–10.3)
Creatinine, Ser: 0.62 mg/dL (ref 0.44–1.00)
GFR calc non Af Amer: >60 mL/min (ref >60)
Glucose, Bld: 117 mg/dL — ABNORMAL HIGH (ref 70–99)
POTASSIUM: 3.8 mmol/L (ref 3.5–5.1)
SODIUM: 141 mmol/L (ref 135–145)

## 2017-11-25 LAB — MAGNESIUM: Magnesium: 2 mg/dL (ref 1.7–2.4)

## 2017-11-25 MED ORDER — FUROSEMIDE 40 MG PO TABS: 40.0000 mg | ORAL_TABLET | Freq: Two times a day (BID) | ORAL | Status: AC

## 2017-11-25 NOTE — Plan of Care (Signed)
[facility] [place] while awake and BiPap while sleeping

## 2017-11-25 NOTE — Progress Notes (Addendum)
Pharmacy Electrolyte Monitoring Consult:  Pharmacy consulted to assist in monitoring and replacing electrolytes in this 57 y.o. female admitted on 11/15/2017 with Respiratory Distress   Labs:  Sodium (mmol/L)  Date Value  11/25/2017 141   Potassium (mmol/L)  Date Value  11/25/2017 3.8   Magnesium (mg/dL)  Date Value  40/98/119108/23/2019 2.0   Phosphorus (mg/dL)  Date Value  47/82/956208/16/2019 4.1   Calcium (mg/dL)  Date Value  13/08/657808/23/2019 8.8 (L)   Albumin (g/dL)  Date Value  46/96/295208/13/2019 3.5    Assessment/Plan:  Goal potassium ~ 4, goal magnesium ~2.    Adjusted potassium dose back to 40mEq Daily on 8/21.   No additional replacement needed at this time.   Will recheck electrolytes with AM labs on 8/25. Will need to replace orally as possible.   Pharmacy will continue to monitor and adjust per consult.   Gardner CandleSheema M Jhana Giarratano, PharmD, BCPS Clinical Pharmacist 11/25/2017 7:31 AM

## 2017-11-25 NOTE — Clinical Social Work Note (Signed)
Physician is discharging patient today to [facility]. CSW has notified patient's daughter and [facility]. York SpanielMonica Eugenia Eldredge MSW,LCSW [phone]

## 2017-11-25 NOTE — Progress Notes (Signed)
Follow up - Critical Care Medicine Note  Patient Details:    Rebecca Bird is an 57 y.o. female.admittedwith Acute on Chronic Hypoxic Hypercarbic Respiratory Failure in setting of OHS/OSA. She transferred out to Med/surg unit 11/17/17 on 4L [place]. On 11/18/17, pt with AMS/confusionand acute decompensation of Acute on Chronic Hypoxic Hypercarbic Respiratory Failure, requiring transfer to ICU for BiPAP.  Lines, Airways, Drains: External Urinary Catheter (Active)  Collection Container Dedicated Suction Canister 11/18/2017  8:00 PM  Securement Method Other (Comment) 11/18/2017  8:00 PM  Intervention Equipment Changed 11/18/2017 12:46 PM  Output (mL) 200 mL 11/19/2017  6:00 AM    Anti-infectives:  Anti-infectives (From admission, onward)   None      Microbiology: Results for orders placed or performed during the hospital encounter of 11/15/17  MRSA PCR Screening     Status: None   Collection Time: 11/16/17  3:30 AM  Result Value Ref Range Status   MRSA by PCR NEGATIVE NEGATIVE Final    Comment:        The GeneXpert MRSA Assay (FDA approved for NASAL specimens only), is one component of a comprehensive MRSA colonization surveillance program. It is not intended to diagnose MRSA infection nor to guide or monitor treatment for MRSA infections. Performed at [facility], [address] [place], [place] [address]     Studies: Dg [facility] LPort 1 View  Result Date: 11/18/2017 CLINICAL DATA:  Hypoxia. EXAM: PORTABLE CHEST 1 VIEW COMPARISON:  11/15/2017. FINDINGS: Cardiomegaly again noted. Diffuse interstitial prominence again noted. Interstitial prominence has increased slightly from prior exam. Findings suggest CHF. Similar findings noted on prior exam. Small bilateral pleural effusions cannot be excluded. No pneumothorax. IMPRESSION: Cardiomegaly with mild pulmonary interstitial prominence again noted. Interstitial prominence has increased slightly from prior exam. Findings  suggest CHF. Similar findings noted on prior exam. Small bilateral pleural effusions cannot be excluded. Electronically Signed   By: Maisie Fushomas  Register   On: 11/18/2017 12:38   Dg Chest Portable 1 View  Result Date: 11/15/2017 CLINICAL DATA:  Hypoxia EXAM: PORTABLE CHEST 1 VIEW COMPARISON:  11/02/2017 FINDINGS: Asymmetric opacity in the right thorax. Cardiomegaly with vascular congestion. No pleural effusion. IMPRESSION: Cardiomegaly with vascular congestion. Asymmetric opacity in the right thorax may reflect asymmetric edema. Findings do not appear significantly changed as compared with 11/02/2017. Electronically Signed   By: Jasmine PangKim  Fujinaga M.D.   On: 11/15/2017 23:58   Dg Chest Port 1 View  Result Date: 11/02/2017 CLINICAL DATA:  Acute respiratory failure EXAM: PORTABLE CHEST 1 VIEW COMPARISON:  11/01/2017 FINDINGS: Cardiomegaly with vascular congestion. Interstitial prominence throughout the lungs, likely mild interstitial edema, similar to prior study. No visible effusions or acute bony abnormality. IMPRESSION: Mild edema/CHF.  No change. Electronically Signed   By: Charlett NoseKevin  Dover M.D.   On: 11/02/2017 07:21   Dg Chest Portable 1 View  Result Date: 11/01/2017 CLINICAL DATA:  Shortness of breath. EXAM: PORTABLE CHEST 1 VIEW COMPARISON:  None. FINDINGS: The heart is enlarged. Vascular congestion and possible central pulmonary edema. No evidence of focal airspace disease, large pleural effusion or pneumothorax. The right humeral head appears anterior to the glenoid but suboptimally assessed. Soft tissue attenuation from body habitus limits detailed assessment. IMPRESSION: 1. Cardiomegaly with vascular congestion and possible central pulmonary edema. 2. Right humeral head appears anteriorly positioned with respect to glenoid, however suboptimally assessed on this portable view, and this may be positioning. Recommend correlation with physical exam. Dedicated shoulder radiographs could be considered based on  clinical  concern. Electronically Signed   By: Rubye OaksMelanie  Ehinger M.D.   On: 11/01/2017 05:07    Consults: Treatment Team:  Enid BaasKalisetti, Radhika, MD Merwyn KatosSimonds, David B, MD   Subjective:    Overnight Issues:patient has done very well, now presently on 3 L nasal cannula, BiPAP at night, has had no further episodes of hypersomnolence or hypoxemia.Wishes to leave today  Objective:  Vital signs for last 24 hours: Temp:  [98.1 F (36.7 C)-98.7 F (37.1 C)] 98.1 F (36.7 C) (08/22 2200) Pulse Rate:  [83-99] 92 (08/23 0700) Resp:  [12-25] 15 (08/23 0700) BP: (61-125)/(50-87) 125/67 (08/23 0700) SpO2:  [86 %-99 %] 89 % (08/23 0700)  Intake/Output from previous day: 08/22 0701 - 08/23 0700 In: 600 [P.O.:600] Out: 450 [Urine:450]  Intake/Output this shift: No intake/output data recorded.  Vent settings for last 24 hours:    Physical Exam:  General:  awake, alert, oriented and in no acute distress Neuro:   No focal deficits appreciated, cranial nerves grossly intact HEENT:  Atraumatic, normocephalic, neck supple, No JVD,  Cardiovascular:  RRR, no M/R/G Lungs:  Diminished throughout, no wheezing, BiPAP assisted, even, normal effort Abdomen:  Obese, soft, nontender, BS distant Musculoskeletal:  No deformities Skin:  No obvious  Rashes, lesions, or uclerations   Assessment/Plan:   OSA/OHS. Patient is presently optimized. Is on 3 L nasal cannula during the day and will require 3 L leading to her BiPAP machine with a pressure of 15/8. She will be discharged to her skilled nursing facility today. I strongly encouraged her ongoing weight loss and eventual referral to bariatric center  [facility]ON[facility]ORTI,Janae Bonser 11/25/2017  *Care during the described time interval was provided by me and/or other providers on the critical care team.  I have reviewed this patient's available data, including medical history, events of note, physical examination and test results as part of my evaluation. Patient ID: Rebecca Masonammy G  Bird, female   DOB: 15-Jan-1961, 57 y.o.   MRN: [id number] Patient ID: Rebecca Bird, female   DOB: 15-Jan-1961, 57 y.o.   MRN: [id number] Patient ID: Rebecca Bird, female   DOB: 15-Jan-1961, 57 y.o.   MRN: [id number] Patient ID: Rebecca Bird, female   DOB: 15-Jan-1961, 57 y.o.   MRN: [id number]

## 2017-11-25 NOTE — Progress Notes (Signed)
[employer] - [place] at [facility]   PATIENT NAME: Rebecca Bird    MR#:  [id number]  DATE OF BIRTH:  [birth year]  SUBJECTIVE:  CHIEF COMPLAINT:   Chief Complaint  Patient presents with  . Respiratory Distress   The patient has no complaints, on oxygen [place] 3 L, BiPAP as needed. REVIEW OF SYSTEMS:  CONSTITUTIONAL: No fever, fatigue or weakness.  EYES: No blurred or double vision.  EARS, NOSE, AND THROAT: No tinnitus or ear pain.  RESPIRATORY: No cough, shortness of breath, wheezing or hemoptysis.  CARDIOVASCULAR: No chest pain, orthopnea, edema.  GASTROINTESTINAL: No nausea, vomiting, diarrhea or abdominal pain.  GENITOURINARY: No dysuria, hematuria.  ENDOCRINE: No polyuria, nocturia,  HEMATOLOGY: No anemia, easy bruising or bleeding SKIN: No rash or lesion. MUSCULOSKELETAL: No joint pain or arthritis.   NEUROLOGIC: No tingling, numbness, weakness.  PSYCHIATRY: No anxiety or depression.   ROS  DRUG ALLERGIES:   Allergies  Allergen Reactions  . Levaquin [Levofloxacin In D5w] Itching and Rash    VITALS:  Blood pressure 113/64, pulse 93, temperature 97.7 F (36.5 C), temperature source Oral, resp. rate (!) 24, height 5\' 6"  (1.676 m), weight (!) 204.2 kg, SpO2 90 %.  PHYSICAL EXAMINATION:  GENERAL:  57 y.o.-year-old patient lying in the bed with no acute distress.  Morbid obesity. EYES: Pupils equal, round, reactive to light and accommodation. No scleral icterus. Extraocular muscles intact.  HEENT: Head atraumatic, normocephalic. Oropharynx and nasopharynx clear.  NECK:  Supple, no jugular venous distention. No thyroid enlargement, no tenderness.  LUNGS: Normal breath sounds bilaterally, no wheezing, rales,rhonchi or crepitation. No use of accessory muscles of respiration.  CARDIOVASCULAR: S1, S2 normal. No murmurs, rubs, or gallops.  ABDOMEN: Soft, nontender, nondistended. Bowel sounds present. No organomegaly or mass.  EXTREMITIES: No pedal edema,  cyanosis, or clubbing.  NEUROLOGIC: Cranial nerves II through XII are intact. Muscle strength 4/5 in all extremities. Sensation intact. Gait not checked.  PSYCHIATRIC: The patient is alert and oriented x 3.  SKIN: No obvious rash, lesion, or ulcer.   Physical Exam LABORATORY PANEL:   CBC Recent Labs  Lab 11/24/17 0504  WBC 3.6  HGB 11.8*  HCT 35.9  PLT 117*   ------------------------------------------------------------------------------------------------------------------  Chemistries  Recent Labs  Lab 11/25/17 0428  NA 141  K 3.8  CL 91*  CO2 40*  GLUCOSE 117*  BUN 23*  CREATININE 0.62  CALCIUM 8.8*  MG 2.0   ------------------------------------------------------------------------------------------------------------------  Cardiac Enzymes No results for input(s): TROPONINI in the last 168 hours. ------------------------------------------------------------------------------------------------------------------  RADIOLOGY:  No results found.  ASSESSMENT AND PLAN:  57 year old female with morbid obesity, diastolic CHF, COPD on 4 L home oxygen and sleep apnea presents to hospital secondary to shortness of breath and hypoxia  *Acute on chronic respiratory failure secondary to COPD exacerbation and obesity hypoventilation syndrome Per intensivist-we will need to limit loop diuretics to some degree, keep bicarb less than 40, given Acetazolamide 500 mg IV x1, BiPAP at bedtime/as needed, per pulmonary - avoid systemic steroids  *Acute toxic metabolic encephalopathy Resolved. Secondary to CO2 narcosis Plan of care as stated above  *Chronic diastolic CHF exacerbation Continue congestive heart failure protocol, IV Lasix, strict I&O monitoring, daily weights, low-sodium/heart healthy diet  *Anemia of chronic disease-stable No indication for transfusion yet  *Chronic extreme morbid obesity Weight loss strongly encouraged Will need bariatric referral status post  discharge Physical therapy to see, family interested in long-term placement-currently at [facility] health care  Consulted palliative care team Long-term  poor prognosis Disposition to [place] [facility] at discharge today.  I discussed with Dr. Lonn Georgiaonforti. All the records are reviewed and case discussed with Care Management/Social Workerr. Management plans discussed with the patient, family and they are in agreement.  CODE STATUS: partial  TOTAL TIME TAKING CARE OF THIS PATIENT: 23 minutes.  POSSIBLE D/C to SNF today.   Shaune PollackQing Nesreen Albano M.D on 11/25/2017   Between 7am to 6pm - Pager - [phone]  After 6pm go to [email or web address] - password [email or web address]  [facility] [place] [facility]  Office  [phone]  CC: Primary care physician; Dinges, Roanna BanningMarie Moore, NP  Note: This dictation was prepared with Dragon dictation along with smaller phrase technology. Any transcriptional errors that result from this process are unintentional.

## 2017-11-25 NOTE — Progress Notes (Signed)
[facility] will [facility] via [facility] with O2 in place.  VSS on 3 liters and pt will be given tramadol before EMS takes her at her request.

## 2017-11-26 LAB — BLOOD GAS, ARTERIAL
BICARBONATE: UNDETERMINED mmol/L (ref 20.0–28.0)
Expiratory PAP: 6
FIO2: 60
Inspiratory PAP: 18
Mechanical Rate: 14
O2 SAT: UNDETERMINED %
PATIENT TEMPERATURE: 37
PH ART: 7.28 — AB (ref 7.350–7.450)
pCO2 arterial: 120 mmHg (ref 32.0–48.0)
pO2, Arterial: 155 mmHg — ABNORMAL HIGH (ref 83.0–108.0)

## 2017-11-28 NOTE — Telephone Encounter (Signed)
Lmov for patient to call and schedule °

## 2017-11-30 NOTE — Telephone Encounter (Signed)
Sent letter

## 2017-12-27 ENCOUNTER — Inpatient Hospital Stay
Admission: EM | Admit: 2017-12-27 | Discharge: 2018-01-01 | DRG: 190 | Disposition: A | Discharge status: Skilled Nursing Facility | Payer: Medicare Other | Attending: Internal Medicine | Admitting: Internal Medicine

## 2017-12-27 ENCOUNTER — Other Ambulatory Visit: Payer: Self-pay

## 2017-12-27 ENCOUNTER — Emergency Department: Payer: Medicare Other

## 2017-12-27 DIAGNOSIS — R0602 Shortness of breath: Secondary | ICD-10-CM

## 2017-12-27 DIAGNOSIS — I10 Essential (primary) hypertension: Secondary | ICD-10-CM | POA: Diagnosis present

## 2017-12-27 DIAGNOSIS — J9622 Acute and chronic respiratory failure with hypercapnia: Secondary | ICD-10-CM | POA: Diagnosis present

## 2017-12-27 DIAGNOSIS — Z79899 Other long term (current) drug therapy: Secondary | ICD-10-CM

## 2017-12-27 DIAGNOSIS — R739 Hyperglycemia, unspecified: Secondary | ICD-10-CM | POA: Diagnosis present

## 2017-12-27 DIAGNOSIS — Z7951 Long term (current) use of inhaled steroids: Secondary | ICD-10-CM

## 2017-12-27 DIAGNOSIS — Z66 Do not resuscitate: Secondary | ICD-10-CM | POA: Diagnosis present

## 2017-12-27 DIAGNOSIS — Z6841 Body Mass Index (BMI) 40.0 and over, adult: Secondary | ICD-10-CM

## 2017-12-27 DIAGNOSIS — Z881 Allergy status to other antibiotic agents status: Secondary | ICD-10-CM

## 2017-12-27 DIAGNOSIS — E039 Hypothyroidism, unspecified: Secondary | ICD-10-CM | POA: Diagnosis present

## 2017-12-27 DIAGNOSIS — G4733 Obstructive sleep apnea (adult) (pediatric): Secondary | ICD-10-CM | POA: Diagnosis present

## 2017-12-27 DIAGNOSIS — I5033 Acute on chronic diastolic (congestive) heart failure: Secondary | ICD-10-CM | POA: Diagnosis present

## 2017-12-27 DIAGNOSIS — J9621 Acute and chronic respiratory failure with hypoxia: Secondary | ICD-10-CM | POA: Diagnosis present

## 2017-12-27 DIAGNOSIS — R0902 Hypoxemia: Secondary | ICD-10-CM | POA: Diagnosis not present

## 2017-12-27 DIAGNOSIS — Z87891 Personal history of nicotine dependence: Secondary | ICD-10-CM

## 2017-12-27 DIAGNOSIS — R0689 Other abnormalities of breathing: Secondary | ICD-10-CM | POA: Diagnosis not present

## 2017-12-27 DIAGNOSIS — I110 Hypertensive heart disease with heart failure: Secondary | ICD-10-CM | POA: Diagnosis present

## 2017-12-27 DIAGNOSIS — J9600 Acute respiratory failure, unspecified whether with hypoxia or hypercapnia: Secondary | ICD-10-CM

## 2017-12-27 DIAGNOSIS — J441 Chronic obstructive pulmonary disease with (acute) exacerbation: Secondary | ICD-10-CM | POA: Diagnosis present

## 2017-12-27 DIAGNOSIS — E662 Morbid (severe) obesity with alveolar hypoventilation: Secondary | ICD-10-CM | POA: Diagnosis present

## 2017-12-27 DIAGNOSIS — F329 Major depressive disorder, single episode, unspecified: Secondary | ICD-10-CM | POA: Diagnosis present

## 2017-12-27 LAB — TROPONIN I

## 2017-12-27 LAB — CBC
HCT: 37.7 % (ref 35.0–47.0)
Hemoglobin: 12 g/dL (ref 12.0–16.0)
MCH: 32.1 pg (ref 26.0–34.0)
MCHC: 31.8 g/dL — AB (ref 32.0–36.0)
MCV: 100.9 fL — ABNORMAL HIGH (ref 80.0–100.0)
Platelets: 190 10*3/uL (ref 150–440)
RBC: 3.74 MIL/uL — AB (ref 3.80–5.20)
RDW: 17.2 % — ABNORMAL HIGH (ref 11.5–14.5)
WBC: 5.7 10*3/uL (ref 3.6–11.0)

## 2017-12-27 LAB — BASIC METABOLIC PANEL
ANION GAP: 12 (ref 5–15)
BUN: 21 mg/dL — ABNORMAL HIGH (ref 6–20)
CALCIUM: 8.7 mg/dL — AB (ref 8.9–10.3)
CO2: 44 mmol/L — ABNORMAL HIGH (ref 22–32)
Chloride: 87 mmol/L — ABNORMAL LOW (ref 98–111)
Creatinine, Ser: 0.64 mg/dL (ref 0.44–1.00)
GLUCOSE: 146 mg/dL — AB (ref 70–99)
POTASSIUM: 5.1 mmol/L (ref 3.5–5.1)
SODIUM: 143 mmol/L (ref 135–145)

## 2017-12-27 LAB — DIFFERENTIAL
BAND NEUTROPHILS: 1 %
Basophils Absolute: 0 10*3/uL (ref 0–0.1)
Basophils Relative: 0 %
Blasts: 0 %
EOS ABS: 0.1 10*3/uL (ref 0–0.7)
Eosinophils Relative: 1 %
Lymphocytes Relative: 18 %
Lymphs Abs: 1 10*3/uL (ref 1.0–3.6)
METAMYELOCYTES PCT: 0 %
MYELOCYTES: 0 %
Monocytes Absolute: 0.5 10*3/uL (ref 0.2–0.9)
Monocytes Relative: 9 %
NEUTROS ABS: 4.1 10*3/uL (ref 1.4–6.5)
Neutrophils Relative %: 71 %
OTHER: 0 %
Promyelocytes Relative: 0 %
nRBC: 2 /100 WBC — ABNORMAL HIGH

## 2017-12-27 LAB — BRAIN NATRIURETIC PEPTIDE: B Natriuretic Peptide: 146 pg/mL — ABNORMAL HIGH (ref 0.0–100.0)

## 2017-12-27 MED ORDER — IPRATROPIUM-ALBUTEROL 0.5-2.5 (3) MG/3ML IN SOLN
9.0000 mL | Freq: Once | RESPIRATORY_TRACT | Status: AC
  Administered 2017-12-27: 9 mL via RESPIRATORY_TRACT
  Filled 2017-12-27: qty 9

## 2017-12-27 MED ORDER — ALBUTEROL SULFATE (2.5 MG/3ML) 0.083% IN NEBU
2.5000 mg | INHALATION_SOLUTION | Freq: Once | RESPIRATORY_TRACT | Status: AC
  Administered 2017-12-27: 2.5 mg via RESPIRATORY_TRACT
  Filled 2017-12-27: qty 3

## 2017-12-27 MED ORDER — METHYLPREDNISOLONE SODIUM SUCC 125 MG IJ SOLR
125.0000 mg | Freq: Once | INTRAMUSCULAR | Status: AC
  Administered 2017-12-27: 125 mg via INTRAVENOUS
  Filled 2017-12-27: qty 2

## 2017-12-27 NOTE — H&P (Signed)
[facility] [place] at [facility]   PATIENT NAME: Rebecca Bird    MR#:  [id number]  DATE OF BIRTH:  07-Aug-1960  DATE OF ADMISSION:  12/27/2017  PRIMARY CARE PHYSICIAN: Dinges, Roanna Banning, NP   REQUESTING/REFERRING PHYSICIAN: Pershing Proud, MD  CHIEF COMPLAINT:   Chief Complaint  Patient presents with  . Shortness of Breath    HISTORY OF PRESENT ILLNESS:  Rebecca Bird  is a 57 y.o. female who presents with chief complaint as above.  Patient is altered, nonverbal at this time and so unable to contribute to her history of present illness.  She has been admitted to this hospital frequently with respiratory condition.  She comes in tonight with a CO2 level of 120 on her blood gas.  She is on BiPAP and hospitalist were called for admission.  PAST MEDICAL HISTORY:   Past Medical History:  Diagnosis Date  . Anemia   . CHF (congestive heart failure) (HCC)   . COPD (chronic obstructive pulmonary disease) (HCC)   . Depression   . Hypertension   . Sleep apnea   . Thyroid disease      PAST SURGICAL HISTORY:  History reviewed. No pertinent surgical history.  Patient unable to give this information due to her current condition SOCIAL HISTORY:   Social History   Tobacco Use  . Smoking status: Former Games developer  . Smokeless tobacco: Never Used  Substance Use Topics  . Alcohol use: Not on file     FAMILY HISTORY:  History reviewed. No pertinent family history.  Patient unable to give this information due to her current condition DRUG ALLERGIES:   Allergies  Allergen Reactions  . Levaquin [Levofloxacin In D5w] Itching and Rash    MEDICATIONS AT HOME:   Prior to Admission medications   Medication Sig Start Date End Date Taking? Authorizing Provider  acetaminophen (TYLENOL) 500 MG tablet Take 500 mg by mouth every 8 (eight) hours as needed for mild pain.     [provider]  albuterol (PROVENTIL HFA;VENTOLIN HFA) 108 (90 Base) MCG/ACT  inhaler Inhale 2 puffs into the lungs every 4 (four) hours as needed for shortness of breath.     [provider]  carvedilol (COREG) 3.125 MG tablet Take 3.125 mg by mouth 2 (two) times daily with a meal.    [provider]  citalopram (CELEXA) 40 MG tablet Take 40 mg by mouth daily.     [provider]  diclofenac sodium (VOLTAREN) 1 % GEL Apply 4 g topically 4 (four) times daily. Apply to right shoulder    [provider]  docusate sodium (COLACE) 100 MG capsule Take 1 capsule (100 mg total) by mouth 2 (two) times daily. 11/05/17   Enid Baas, MD  ferrous sulfate 325 (65 FE) MG tablet Take 325 mg by mouth daily with breakfast.    [provider]  furosemide (LASIX) 40 MG tablet Take 1 tablet (40 mg total) by mouth 2 (two) times daily. 11/25/17   Shaune Pollack, MD  ipratropium-albuterol (DUONEB) 0.5-2.5 (3) MG/3ML SOLN Take 3 mLs by nebulization every 6 (six) hours as needed (shortness of breath). 11/05/17   Enid Baas, MD  levothyroxine (SYNTHROID, LEVOTHROID) 100 MCG tablet Take 1 tablet (100 mcg total) by mouth daily before breakfast. 11/06/17   Enid Baas, MD  potassium chloride (K-DUR) 10 MEQ tablet Take 10 mEq by mouth daily.     [provider]  traMADol (ULTRAM) 50 MG tablet Take 1 tablet (50 mg  total) by mouth every 6 (six) hours as needed for moderate pain. 11/05/17   Enid Baas, MD    REVIEW OF SYSTEMS:  Review of Systems  Unable to perform ROS: Acuity of condition     VITAL SIGNS:   Vitals:   12/27/17 2141 12/27/17 2143 12/27/17 2200 12/27/17 2217  BP:    138/86  Pulse:   86 90  Resp:   (!) 21 18  Temp:    97.9 F (36.6 C)  TempSrc:    Axillary  SpO2: (!) 86%  99% 97%  Weight:  (!) 192.8 kg    Height:  5\' 5"  (1.651 m)     Wt Readings from Last 3 Encounters:  12/27/17 (!) 192.8 kg  11/16/17 (!) 204.2 kg  11/05/17 (!) 203.2 kg    PHYSICAL EXAMINATION:  Physical Exam  Vitals  reviewed. Constitutional: She is oriented to person, place, and time. She appears well-developed and well-nourished. No distress.  HENT:  Head: Normocephalic and atraumatic.  Mouth/Throat: Oropharynx is clear and moist.  Eyes: Pupils are equal, round, and reactive to light. Conjunctivae and EOM are normal. No scleral icterus.  Neck: Normal range of motion. Neck supple. No JVD present. No thyromegaly present.  Cardiovascular: Normal rate, regular rhythm and intact distal pulses. Exam reveals no gallop and no friction rub.  No murmur heard. Respiratory: Effort normal. No respiratory distress. She has wheezes. She has no rales.  GI: Soft. Bowel sounds are normal. She exhibits no distension. There is no tenderness.  Musculoskeletal: Normal range of motion. She exhibits no edema.  No arthritis, no gout  Lymphadenopathy:    She has no cervical adenopathy.  Neurological: She is alert and oriented to person, place, and time. No cranial nerve deficit.  No dysarthria, no aphasia  Skin: Skin is warm and dry. No rash noted. No erythema.  Psychiatric: She has a normal mood and affect. Her behavior is normal. Judgment and thought content normal.    LABORATORY PANEL:   CBC Recent Labs  Lab 12/27/17 2150  WBC 5.7  HGB 12.0  HCT 37.7  PLT 190   ------------------------------------------------------------------------------------------------------------------  Chemistries  Recent Labs  Lab 12/27/17 2150  NA 143  K 5.1  CL 87*  CO2 44*  GLUCOSE 146*  BUN 21*  CREATININE 0.64  CALCIUM 8.7*   ------------------------------------------------------------------------------------------------------------------  Cardiac Enzymes Recent Labs  Lab 12/27/17 2150  TROPONINI <0.03   ------------------------------------------------------------------------------------------------------------------  RADIOLOGY:  Dg Chest 1 View  Result Date: 12/27/2017 CLINICAL DATA:  Dyspnea, hypoxia EXAM:  CHEST  1 VIEW COMPARISON:  11/18/2017 chest radiograph. FINDINGS: Stable cardiomediastinal silhouette with moderate cardiomegaly. No pneumothorax. No pleural effusion. Fluffy diffuse parahilar opacities in the lungs, similar to prior. IMPRESSION: Moderate cardiomegaly. Fluffy parahilar opacities throughout both lungs, favor moderate pulmonary edema. Electronically Signed   By: Delbert Phenix M.D.   On: 12/27/2017 22:42    EKG:   Orders placed or performed during the hospital encounter of 12/27/17  . EKG 12-Lead  . EKG 12-Lead    IMPRESSION AND PLAN:  Principal Problem:   Acute on chronic respiratory failure with hypoxia and hypercapnia (HCC) -patient is currently on BiPAP, will admit her to ICU and maintain BiPAP for now.  Treatment for COPD as below, recheck blood gas in the morning. Active Problems:   COPD with acute exacerbation (HCC) -IV Solu-Medrol, duo nebs, continue home meds   OSA (obstructive sleep apnea) -BiPAP as above   HTN (hypertension) -continue home medications   Hypothyroidism -home  dose thyroid replacement  Chart review performed and case discussed with ED provider. Labs, imaging and/or ECG reviewed by provider and discussed with patient/family. Management plans discussed with the patient and/or family.  DVT PROPHYLAXIS: SubQ lovenox   GI PROPHYLAXIS:  None  ADMISSION STATUS: Inpatient     CODE STATUS: DNR per outside yellow DNR form brought with patient Code Status History    Date Active Date Inactive Code Status Order ID Comments User Context   11/21/2017 1408 11/25/2017 2055 Partial Code [id number]  Alita ChyleMason, Megan M, NP Inpatient   11/16/2017 0328 11/21/2017 1408 Full Code [id number]  Arnaldo Nataliamond, Michael S, MD Inpatient   11/01/2017 0850 11/05/2017 1903 Full Code [id number]  Arnaldo Nataliamond, Michael S, MD Inpatient    Questions for Most Recent Historical Code Status (Order [id number])    Question Answer Comment   In the event of cardiac or respiratory ARREST: Initiate Code Blue, Call  Rapid Response Yes    In the event of cardiac or respiratory ARREST: Perform CPR Yes    In the event of cardiac or respiratory ARREST: Perform Intubation/Mechanical Ventilation No    In the event of cardiac or respiratory ARREST: Use NIPPV/BiPAp only if indicated Yes    In the event of cardiac or respiratory ARREST: Administer ACLS medications if indicated Yes    In the event of cardiac or respiratory ARREST: Perform Defibrillation or Cardioversion if indicated Yes    Comments Patient wishes for one cycle of CPR/ACLS. If she is not revived, do not attempt further CPR/ACLS.         Advance Directive Documentation     Most Recent Value  Type of Advance Directive  Out of facility DNR (pink MOST or yellow form)  Pre-existing out of facility DNR order (yellow form or pink MOST form)  Physician notified to receive inpatient order, Yellow form placed in chart (order not valid for inpatient use)  "MOST" Form in Place?  -      TOTAL CRITICAL CARE TIME TAKING CARE OF THIS PATIENT: 50 minutes.   Anne HahnWILLIS, Kenan Moodie FIELDING 12/27/2017, 11:30 PM  [facility]  Office  [phone]  CC: Primary care physician; Dinges, Roanna BanningMarie Moore, NP  Note:  This document was prepared using Dragon voice recognition software and may include unintentional dictation errors.

## 2017-12-27 NOTE — ED Provider Notes (Signed)
[facility] Emergency Department Provider Note  ___________________________________________   First MD Initiated Contact with Patient 12/27/17 2142     (approximate)  I have reviewed the triage vital signs and the nursing notes.   HISTORY  Chief Complaint Shortness of Breath   HPI Rebecca Bird is a 57 y.o. female with a history of CHF, COPD and hypercapnic respiratory failure who was presented to the emergency department with shortness of breath throughout the day which is worsening.  EMS arrived and found the patient to be in the 70s on her home CPAP.  They transferred over to the EMS CPAP were able to get her up to an oxygen saturation of 95% just prior to arrival to the emergency department.  End-tidal CO2 was approximately 90.  Patient denying any pain at this time.  Denies any cough.  Past Medical History:  Diagnosis Date  . Anemia   . CHF (congestive heart failure) (HCC)   . COPD (chronic obstructive pulmonary disease) (HCC)   . Depression   . Hypertension   . Sleep apnea   . Thyroid disease     Patient Active Problem List   Diagnosis Date Noted  . OSA (obstructive sleep apnea)   . Obesity hypoventilation syndrome (HCC)   . Acute respiratory failure with hypoxia and hypercapnia (HCC)   . Palliative care by specialist   . Goals of care, counseling/discussion   . Acute on chronic respiratory failure with hypoxia and hypercapnia (HCC) 11/16/2017  . Acute on chronic respiratory failure with hypoxemia (HCC) 11/01/2017    History reviewed. No pertinent surgical history.  Prior to Admission medications   Medication Sig Start Date End Date Taking? Authorizing Provider  acetaminophen (TYLENOL) 500 MG tablet Take 500 mg by mouth every 8 (eight) hours as needed for mild pain.     [provider]  albuterol (PROVENTIL HFA;VENTOLIN HFA) 108 (90 Base) MCG/ACT inhaler Inhale 2 puffs into the lungs every 4 (four) hours as needed for shortness  of breath.     [provider]  carvedilol (COREG) 3.125 MG tablet Take 3.125 mg by mouth 2 (two) times daily with a meal.    [provider]  citalopram (CELEXA) 40 MG tablet Take 40 mg by mouth daily.     [provider]  diclofenac sodium (VOLTAREN) 1 % GEL Apply 4 g topically 4 (four) times daily. Apply to right shoulder    [provider]  docusate sodium (COLACE) 100 MG capsule Take 1 capsule (100 mg total) by mouth 2 (two) times daily. 11/05/17   Enid Baas, MD  ferrous sulfate 325 (65 FE) MG tablet Take 325 mg by mouth daily with breakfast.    [provider]  furosemide (LASIX) 40 MG tablet Take 1 tablet (40 mg total) by mouth 2 (two) times daily. 11/25/17   Shaune Pollack, MD  ipratropium-albuterol (DUONEB) 0.5-2.5 (3) MG/3ML SOLN Take 3 mLs by nebulization every 6 (six) hours as needed (shortness of breath). 11/05/17   Enid Baas, MD  levothyroxine (SYNTHROID, LEVOTHROID) 100 MCG tablet Take 1 tablet (100 mcg total) by mouth daily before breakfast. 11/06/17   Enid Baas, MD  potassium chloride (K-DUR) 10 MEQ tablet Take 10 mEq by mouth daily.     [provider]  traMADol (ULTRAM) 50 MG tablet Take 1 tablet (50 mg total) by mouth every 6 (six) hours as needed for moderate pain. 11/05/17   Enid Baas, MD    Allergies Levaquin [levofloxacin in  d5w]  History reviewed. No pertinent family history.  Social History Social History   Tobacco Use  . Smoking status: Former Games developermoker  . Smokeless tobacco: Never Used  Substance Use Topics  . Alcohol use: Not on file  . Drug use: Not on file    Review of Systems  Constitutional: No fever/chills Eyes: No visual changes. ENT: No sore throat. Cardiovascular: Denies chest pain. Respiratory: As above Gastrointestinal: No abdominal pain.  No nausea, no vomiting.  No diarrhea.  No constipation. Genitourinary: Negative for dysuria. Musculoskeletal: Negative for back  pain. Skin: Negative for rash. Neurological: Negative for headaches, focal weakness or numbness.   ____________________________________________   PHYSICAL EXAM:  VITAL SIGNS: ED Triage Vitals  Enc Vitals Group     BP 12/27/17 2217 138/86     Pulse Rate 12/27/17 2200 86     Resp 12/27/17 2200 (!) 21     Temp 12/27/17 2217 97.9 F (36.6 C)     Temp Source 12/27/17 2217 Axillary     SpO2 12/27/17 2141 (!) 86 %     Weight 12/27/17 2143 (!) 425 lb (192.8 kg)     Height 12/27/17 2143 5\' 5"  (1.651 m)     Head Circumference --      Peak Flow --      Pain Score 12/27/17 2143 10     Pain Loc --      Pain Edu? --      Excl. in GC? --     Constitutional: Alert and oriented.  Patient is morbidly obese.  Mildly tachypneic. Eyes: Conjunctivae are normal.  Head: Atraumatic. Nose: No congestion/rhinnorhea. Mouth/Throat: Mucous membranes are moist.  Neck: No stridor.   Cardiovascular: Normal rate, regular rhythm. Grossly normal heart sounds.   Respiratory: Mildly labored respirations with decreased lung sounds throughout. Gastrointestinal: Soft and nontender. No distention.  Musculoskeletal: Moderate bilateral lower extremity edema. Neurologic:  Normal speech and language. No gross focal neurologic deficits are appreciated. Skin:  Skin is warm, dry and intact. No rash noted. Psychiatric: Mood and affect are normal. Speech and behavior are normal.  ____________________________________________   LABS (all labs ordered are listed, but only abnormal results are displayed)  Labs Reviewed  BASIC METABOLIC PANEL - Abnormal; Notable for the following components:      Result Value   Chloride 87 (*)    CO2 44 (*)    Glucose, Bld 146 (*)    BUN 21 (*)    Calcium 8.7 (*)    All other components within normal limits  BRAIN NATRIURETIC PEPTIDE - Abnormal; Notable for the following components:   B Natriuretic Peptide 146.0 (*)    All other components within normal limits  BLOOD GAS,  ARTERIAL - Abnormal; Notable for the following components:   pH, Arterial 7.27 (*)    pCO2 arterial 120 (*)    pO2, Arterial 116 (*)    All other components within normal limits  CBC - Abnormal; Notable for the following components:   RBC 3.74 (*)    MCV 100.9 (*)    MCHC 31.8 (*)    RDW 17.2 (*)    All other components within normal limits  TROPONIN I  CBC WITH DIFFERENTIAL/PLATELET  DIFFERENTIAL   ____________________________________________  EKG  ED ECG REPORT I, Arelia Longestavid M Arbie Blankley, the attending physician, personally viewed and interpreted this ECG.   Date: 12/27/2017  EKG Time: 2156  Rate: 86  Rhythm: normal sinus rhythm  Axis: Sinus rhythm  Intervals:none  ST&T Change:  No ST segment elevation or depression.  No abnormal T wave inversion.  ____________________________________________  RADIOLOGY  Moderate cardiomegaly with fluffy perihilar opacities throughout both lungs which is likely related to moderate pulmonary edema. ____________________________________________   PROCEDURES  Procedure(s) performed:   .Critical Care Performed by: Myrna Blazer, MD Authorized by: Myrna Blazer, MD   Critical care provider statement:    Critical care time (minutes):  35   Critical care time was exclusive of:  Separately billable procedures and treating other patients   Critical care was necessary to treat or prevent imminent or life-threatening deterioration of the following conditions:  Respiratory failure   Critical care was time spent personally by me on the following activities:  Development of treatment plan with patient or surrogate, discussions with consultants, evaluation of patient's response to treatment, examination of patient, obtaining history from patient or surrogate, ordering and performing treatments and interventions, ordering and review of laboratory studies, ordering and review of radiographic studies, pulse oximetry, re-evaluation of  patient's condition and review of old charts    Critical Care performed:   ____________________________________________   INITIAL IMPRESSION / ASSESSMENT AND PLAN / ED COURSE  Pertinent labs & imaging results that were available during my care of the patient were reviewed by me and considered in my medical decision making (see chart for details).  Differential includes, but is not limited to, viral syndrome, bronchitis including COPD exacerbation, pneumonia, reactive airway disease including asthma, CHF including exacerbation with or without pulmonary/interstitial edema, pneumothorax, ACS, thoracic trauma, and pulmonary embolism. As part of my medical decision making, I reviewed the following data within the electronic MEDICAL RECORD NUMBER Notes from prior ED visits  ----------------------------------------- 11:09 PM on 12/27/2017 -----------------------------------------  Patient to be admitted at this time for hypercapnic respiratory failure.  Signed out to Dr. Anne Hahn.  Patient understand the treatment and willing to comply. ____________________________________________   FINAL CLINICAL IMPRESSION(S) / ED DIAGNOSES  Hypoxia.  Hypercapnic respiratory failure.  NEW MEDICATIONS STARTED DURING THIS VISIT:  New Prescriptions   No medications on file     Note:  This document was prepared using Dragon voice recognition software and may include unintentional dictation errors.     Myrna Blazer, MD 12/27/17 2312

## 2017-12-27 NOTE — ED Notes (Signed)
X-ray at bedside

## 2017-12-27 NOTE — ED Triage Notes (Signed)
Pt comes from [facility] via [facility] with SOB starting this morning. Sats were around 70s with CPAP present. Cap was in the 90s per EMS. CPAP with 7.5 PEEP on EMS. Pt is DNR.

## 2017-12-27 NOTE — ED Notes (Signed)
[id number] [phone] Rebecca HeckDanielle (daughter)- Call daughter when room is assigned and what doctor will be taking care of her

## 2017-12-28 ENCOUNTER — Inpatient Hospital Stay: Payer: Self-pay

## 2017-12-28 DIAGNOSIS — R0902 Hypoxemia: Secondary | ICD-10-CM

## 2017-12-28 DIAGNOSIS — J9621 Acute and chronic respiratory failure with hypoxia: Secondary | ICD-10-CM

## 2017-12-28 DIAGNOSIS — R0602 Shortness of breath: Secondary | ICD-10-CM

## 2017-12-28 DIAGNOSIS — R0689 Other abnormalities of breathing: Secondary | ICD-10-CM

## 2017-12-28 DIAGNOSIS — J9622 Acute and chronic respiratory failure with hypercapnia: Secondary | ICD-10-CM

## 2017-12-28 LAB — CBC
HCT: 39.8 % (ref 35.0–47.0)
HEMOGLOBIN: 13.1 g/dL (ref 12.0–16.0)
MCH: 32.6 pg (ref 26.0–34.0)
MCHC: 32.9 g/dL (ref 32.0–36.0)
MCV: 99 fL (ref 80.0–100.0)
PLATELETS: 190 10*3/uL (ref 150–440)
RBC: 4.02 MIL/uL (ref 3.80–5.20)
RDW: 16.7 % — ABNORMAL HIGH (ref 11.5–14.5)
WBC: 4.8 10*3/uL (ref 3.6–11.0)

## 2017-12-28 LAB — BASIC METABOLIC PANEL
Anion gap: 14 (ref 5–15)
BUN: 21 mg/dL — AB (ref 6–20)
CALCIUM: 9.4 mg/dL (ref 8.9–10.3)
CO2: 44 mmol/L — AB (ref 22–32)
Chloride: 86 mmol/L — ABNORMAL LOW (ref 98–111)
Creatinine, Ser: 0.7 mg/dL (ref 0.44–1.00)
GFR calc Af Amer: >60 mL/min (ref >60)
GLUCOSE: 118 mg/dL — AB (ref 70–99)
Potassium: 5.9 mmol/L — ABNORMAL HIGH (ref 3.5–5.1)
Sodium: 144 mmol/L (ref 135–145)

## 2017-12-28 LAB — MRSA PCR SCREENING: MRSA BY PCR: NEGATIVE

## 2017-12-28 LAB — NA AND K (SODIUM & POTASSIUM), RAND UR
Potassium Urine: 42 mmol/L
Sodium, Ur: 141 mmol/L

## 2017-12-28 LAB — GLUCOSE, CAPILLARY
GLUCOSE-CAPILLARY: 130 mg/dL — AB (ref 70–99)
GLUCOSE-CAPILLARY: 123 mg/dL — AB (ref 70–99)
Glucose-Capillary: 154 mg/dL — ABNORMAL HIGH (ref 70–99)
Glucose-Capillary: 157 mg/dL — ABNORMAL HIGH (ref 70–99)
Glucose-Capillary: 160 mg/dL — ABNORMAL HIGH (ref 70–99)
Glucose-Capillary: 107 mg/dL — ABNORMAL HIGH (ref 70–99)

## 2017-12-28 LAB — TSH: TSH: 1.258 u[IU]/mL (ref 0.350–4.500)

## 2017-12-28 MED ORDER — ENOXAPARIN SODIUM 40 MG/0.4ML ~~LOC~~ SOLN
40.0000 mg | Freq: Two times a day (BID) | SUBCUTANEOUS | Status: DC
  Administered 2017-12-28 – 2018-01-01 (×10): 40 mg via SUBCUTANEOUS
  Filled 2017-12-28 (×10): qty 0.4

## 2017-12-28 MED ORDER — INSULIN ASPART 100 UNIT/ML ~~LOC~~ SOLN
0.0000 [IU] | SUBCUTANEOUS | Status: DC
  Administered 2017-12-28: 3 [IU] via SUBCUTANEOUS
  Administered 2017-12-28 (×2): 4 [IU] via SUBCUTANEOUS
  Administered 2017-12-28 – 2017-12-30 (×6): 3 [IU] via SUBCUTANEOUS
  Administered 2017-12-30 (×2): 4 [IU] via SUBCUTANEOUS
  Administered 2017-12-31: 3 [IU] via SUBCUTANEOUS
  Filled 2017-12-28 (×12): qty 1

## 2017-12-28 MED ORDER — METHYLPREDNISOLONE SODIUM SUCC 40 MG IJ SOLR
40.0000 mg | Freq: Two times a day (BID) | INTRAMUSCULAR | Status: DC
  Administered 2017-12-28 – 2017-12-29 (×2): 40 mg via INTRAVENOUS
  Filled 2017-12-28 (×2): qty 1

## 2017-12-28 MED ORDER — FUROSEMIDE 20 MG PO TABS
40.0000 mg | ORAL_TABLET | Freq: Two times a day (BID) | ORAL | Status: DC
  Administered 2017-12-29 – 2017-12-31 (×5): 40 mg via ORAL
  Filled 2017-12-28 (×5): qty 2

## 2017-12-28 MED ORDER — IPRATROPIUM-ALBUTEROL 0.5-2.5 (3) MG/3ML IN SOLN
3.0000 mL | RESPIRATORY_TRACT | Status: DC
  Administered 2017-12-28 (×3): 3 mL via RESPIRATORY_TRACT
  Filled 2017-12-28 (×2): qty 3

## 2017-12-28 MED ORDER — INSULIN ASPART 100 UNIT/ML IV SOLN
5.0000 [IU] | Freq: Once | INTRAVENOUS | Status: AC
  Administered 2017-12-28: 5 [IU] via INTRAVENOUS
  Filled 2017-12-28: qty 0.05

## 2017-12-28 MED ORDER — ACETAMINOPHEN 325 MG PO TABS
650.0000 mg | ORAL_TABLET | Freq: Four times a day (QID) | ORAL | Status: DC | PRN
  Administered 2017-12-29 – 2018-01-01 (×3): 650 mg via ORAL
  Filled 2017-12-28 (×3): qty 2

## 2017-12-28 MED ORDER — IPRATROPIUM BROMIDE 0.02 % IN SOLN
0.5000 mg | Freq: Four times a day (QID) | RESPIRATORY_TRACT | Status: DC
  Administered 2017-12-28 – 2017-12-31 (×12): 0.5 mg via RESPIRATORY_TRACT
  Filled 2017-12-28 (×12): qty 2.5

## 2017-12-28 MED ORDER — ALBUTEROL SULFATE (2.5 MG/3ML) 0.083% IN NEBU
10.0000 mg | INHALATION_SOLUTION | Freq: Once | RESPIRATORY_TRACT | Status: AC
  Administered 2017-12-28: 10 mg via RESPIRATORY_TRACT
  Filled 2017-12-28: qty 12

## 2017-12-28 MED ORDER — FUROSEMIDE 40 MG PO TABS
40.0000 mg | ORAL_TABLET | Freq: Two times a day (BID) | ORAL | Status: DC
  Filled 2017-12-28 (×2): qty 1

## 2017-12-28 MED ORDER — SODIUM CHLORIDE 0.9% FLUSH: 10.0000 mL | INTRAVENOUS | Status: DC | PRN

## 2017-12-28 MED ORDER — DEXMEDETOMIDINE HCL IN NACL 400 MCG/100ML IV SOLN
0.4000 ug/kg/h | INTRAVENOUS | Status: DC
  Administered 2017-12-28: 0.4 ug/kg/h via INTRAVENOUS

## 2017-12-28 MED ORDER — FUROSEMIDE 10 MG/ML IJ SOLN
40.0000 mg | Freq: Two times a day (BID) | INTRAMUSCULAR | Status: AC
  Administered 2017-12-28 (×2): 40 mg via INTRAVENOUS
  Filled 2017-12-28 (×2): qty 4

## 2017-12-28 MED ORDER — MEDROXYPROGESTERONE ACETATE 10 MG PO TABS
10.0000 mg | ORAL_TABLET | Freq: Every day | ORAL | Status: DC
  Administered 2017-12-28 – 2017-12-30 (×3): 10 mg via ORAL
  Filled 2017-12-28 (×4): qty 1

## 2017-12-28 MED ORDER — ONDANSETRON HCL 4 MG/2ML IJ SOLN: 4.0000 mg | Freq: Four times a day (QID) | INTRAMUSCULAR | Status: DC | PRN

## 2017-12-28 MED ORDER — DEXTROSE 50 % IV SOLN
1.0000 | Freq: Once | INTRAVENOUS | Status: AC
  Administered 2017-12-28: 50 mL via INTRAVENOUS
  Filled 2017-12-28: qty 50

## 2017-12-28 MED ORDER — FAMOTIDINE 20 MG PO TABS
20.0000 mg | ORAL_TABLET | Freq: Two times a day (BID) | ORAL | Status: DC
  Administered 2017-12-28 – 2017-12-30 (×5): 20 mg via ORAL
  Filled 2017-12-28 (×5): qty 1

## 2017-12-28 MED ORDER — CARVEDILOL 6.25 MG PO TABS
3.1250 mg | ORAL_TABLET | Freq: Two times a day (BID) | ORAL | Status: DC
  Administered 2017-12-29 – 2018-01-01 (×6): 3.125 mg via ORAL
  Filled 2017-12-28 (×7): qty 1

## 2017-12-28 MED ORDER — ACETAMINOPHEN 650 MG RE SUPP: 650.0000 mg | Freq: Four times a day (QID) | RECTAL | Status: DC | PRN

## 2017-12-28 MED ORDER — CHLORHEXIDINE GLUCONATE 0.12 % MT SOLN
OROMUCOSAL | Status: AC
  Filled 2017-12-28: qty 15

## 2017-12-28 MED ORDER — CITALOPRAM HYDROBROMIDE 20 MG PO TABS
40.0000 mg | ORAL_TABLET | Freq: Every day | ORAL | Status: DC
  Administered 2017-12-28 – 2018-01-01 (×5): 40 mg via ORAL
  Filled 2017-12-28 (×5): qty 2

## 2017-12-28 MED ORDER — ACETAZOLAMIDE SODIUM 500 MG IJ SOLR
250.0000 mg | Freq: Once | INTRAMUSCULAR | Status: AC
  Administered 2017-12-28: 250 mg via INTRAVENOUS
  Filled 2017-12-28: qty 250

## 2017-12-28 MED ORDER — LEVOTHYROXINE SODIUM 50 MCG PO TABS
100.0000 ug | ORAL_TABLET | Freq: Every day | ORAL | Status: DC
  Administered 2017-12-29 – 2018-01-01 (×4): 100 ug via ORAL
  Filled 2017-12-28 (×4): qty 2

## 2017-12-28 MED ORDER — METHYLPREDNISOLONE SODIUM SUCC 125 MG IJ SOLR
60.0000 mg | Freq: Four times a day (QID) | INTRAMUSCULAR | Status: DC
  Administered 2017-12-28: 60 mg via INTRAVENOUS
  Filled 2017-12-28: qty 2

## 2017-12-28 MED ORDER — IPRATROPIUM-ALBUTEROL 0.5-2.5 (3) MG/3ML IN SOLN: 3.0000 mL | Freq: Four times a day (QID) | RESPIRATORY_TRACT | Status: DC

## 2017-12-28 MED ORDER — SODIUM CHLORIDE 0.9% FLUSH
10.0000 mL | Freq: Two times a day (BID) | INTRAVENOUS | Status: DC
  Administered 2017-12-28 – 2017-12-29 (×2): 10 mL
  Administered 2017-12-29: 20 mL
  Administered 2017-12-30: 40 mL
  Administered 2017-12-30 – 2017-12-31 (×2): 10 mL
  Administered 2017-12-31: 30 mL
  Administered 2018-01-01: 20 mL

## 2017-12-28 MED ORDER — IPRATROPIUM-ALBUTEROL 0.5-2.5 (3) MG/3ML IN SOLN
RESPIRATORY_TRACT | Status: AC
  Filled 2017-12-28: qty 3

## 2017-12-28 MED ORDER — ONDANSETRON HCL 4 MG PO TABS: 4.0000 mg | ORAL_TABLET | Freq: Four times a day (QID) | ORAL | Status: DC | PRN

## 2017-12-28 NOTE — Consult Note (Addendum)
Name: Rebecca Bird MRN: [id number] DOB: 09-Oct-1960    ADMISSION DATE:  12/27/2017 CONSULTATION DATE:  12/28/2017  REFERRING MD :  Dr. Anne Hahn  CHIEF COMPLAINT:  Shortness of Breath  BRIEF Rebecca Bird DESCRIPTION:  57 y.o. Female admitted with Acute Hypoxic Hypercapnic Respiratory Failure requiring BiPAP in setting of Acute on Chronic CHF and COPD exacerbation  SIGNIFICANT EVENTS  12/28/2017>> Admission to [facility] Stepdown  STUDIES:    HISTORY OF PRESENT ILLNESS:   Rebecca Bird is a 57 y.o. Female with a PMH of CHF, COPD, Sleep apnea, obesity,Anemia, HTN, and Thyroid disease who presents to [facility] ED 12/27/17 with c/o shortness of breath.  When EMS arrived, pt was noted to be hypoxic with O2 sats in the 70'w while on home CPAP, and end-tidal CO2 was approximately 90. Pt is currently lethargic and slightly confused on BiPAP, therefore history is obtained from ED and nursing notes.  It is reported that her SOB worsened throughout the day on 9/24, prompting her to call EMS.  Initial workup in the ED revealed  Serum bicarb 44, BNP 146, negative troponin, ABG: pH 7.27 / CO2 120 / pO2 116.  CXR was concerning for moderate cardiomegaly, and opacities throughout both lungs suspicious for moderate pulmonary edema.  She is admitted to [facility]  Stepdown unit for treatment of Acute Hypoxic Hypercapnic Respiratory failure requiring BiPAP.  PCCM is consulted for further management.  Subjective: Rebecca Bird seen in the morning, much more awake responsive, uses 3 L of oxygen 24/7, usually mobile, possibility of chronic hypercarbic respiratory failure due to obesity hypoventilation syndrome with baseline bicarb of 44 - Overall doing well -1500 -Blood sugar is 157 - Prior echo reviewed [facility] July 2019-EF 55 to 60%  PAST MEDICAL HISTORY :   has a past medical history of Anemia, CHF (congestive heart failure) (HCC), COPD (chronic obstructive pulmonary disease) (HCC), Depression, Hypertension, Sleep apnea, and Thyroid  disease.  has no past surgical history on file. Prior to Admission medications   Medication Sig Start Date End Date Taking? Authorizing Provider  carvedilol (COREG) 3.125 MG tablet Take 3.125 mg by mouth 2 (two) times daily with a meal.   Yes [provider]  citalopram (CELEXA) 40 MG tablet Take 40 mg by mouth daily.    Yes [provider]  ferrous sulfate 325 (65 FE) MG tablet Take 325 mg by mouth daily with breakfast.   Yes [provider]  furosemide (LASIX) 40 MG tablet Take 1 tablet (40 mg total) by mouth 2 (two) times daily. 11/25/17  Yes Shaune Pollack, MD  ipratropium (ATROVENT) 0.03 % nasal spray Place 1 spray into both nostrils every 6 (six) hours as needed (shortness of breath/ bronchospasm).   Yes [provider]  ipratropium-albuterol (DUONEB) 0.5-2.5 (3) MG/3ML SOLN Take 3 mLs by nebulization every 6 (six) hours as needed (shortness of breath). Rebecca Bird taking differently: Take 3 mLs by nebulization every 6 (six) hours as needed (shortness of breath/ wheezing).  11/05/17  Yes Enid Baas, MD  levothyroxine (SYNTHROID, LEVOTHROID) 100 MCG tablet Take 1 tablet (100 mcg total) by mouth daily before breakfast. 11/06/17  Yes Enid Baas, MD  Turmeric 450 MG CAPS Take 450 mg by mouth daily.   Yes [provider]   Allergies  Allergen Reactions  . Levaquin [Levofloxacin In D5w] Itching and Rash    FAMILY HISTORY:  family history is not on file. SOCIAL HISTORY:  reports that she has quit smoking. She has never used smokeless tobacco.  REVIEW  OF SYSTEMS:   Unable to assess due to pt lethargy, slight confusion, and BiPAP  SUBJECTIVE:  Unable to assess due to pt lethargy, slight confusion, and BiPAP  VITAL SIGNS: Temp:  [97.9 F (36.6 C)] 97.9 F (36.6 C) (09/24 2217) Pulse Rate:  [86-90] 90 (09/24 2217) Resp:  [18-21] 18 (09/24 2217) BP: (138)/(86) 138/86 (09/24 2217) SpO2:  [86 %-99 %] 97 % (09/24 2217) Weight:  [192.8 kg]  192.8 kg (09/24 2143)  PHYSICAL EXAMINATION: General:  Acute on chronically ill appearing, morbidly obese female, laying in bed, on BiPAP, in NAD Neuro:  Lethargic, arouses to voice, Oriented to person, place, situation.  Follows commands, no focal deficits.  Pupils PERRL 3 mm bilaterally. HEENT:  Atraumatic, normocephalic, neck supple, no JVD Cardiovascular:  RRR, s1s2, no M/R/G Lungs:  Clear diminished bilaterally, no wheezing.  Even, non-labored, BiPAP assisted Abdomen:  Obese, soft, non-tender, BS= x4 Musculoskeletal:  No deformities, normal bulk and tone Skin:  Warm/dry.  No obvious rashes, lesions, or ulcerations  Recent Labs  Lab 12/27/17 2150  NA 143  K 5.1  CL 87*  CO2 44*  BUN 21*  CREATININE 0.64  GLUCOSE 146*   Recent Labs  Lab 12/27/17 2150  HGB 12.0  HCT 37.7  WBC 5.7  PLT 190   Dg Chest 1 View  Result Date: 12/27/2017 CLINICAL DATA:  Dyspnea, hypoxia EXAM: CHEST  1 VIEW COMPARISON:  11/18/2017 chest radiograph. FINDINGS: Stable cardiomediastinal silhouette with moderate cardiomegaly. No pneumothorax. No pleural effusion. Fluffy diffuse parahilar opacities in the lungs, similar to prior. IMPRESSION: Moderate cardiomegaly. Fluffy parahilar opacities throughout both lungs, favor moderate pulmonary edema. Electronically Signed   By: Delbert Phenix M.D.   On: 12/27/2017 22:42    ASSESSMENT / PLAN:  A: Acute Hypoxic Hypercapnic Respiratory Failure in setting of Acute on Chronic CHF, COPD exacerbation, and ? Obesity Hypoventilation syndrome Hx: COPD, sleep apnea P: -Supplemental O2 to maintain O2 sats 88 to 92% -BiPAP, wean as tolerated-plan to wean in the morning, repeat blood gas on that -IV Lasix 40 mg BID x2 doses (pt takes 40 mg PO lasix BID at home) - Add Diamox to 250 mg IV 1 dose to adjust bicarb -Add Provera 10 mg daily for 3 days as a respiratory stimulant -Bronchodilators -Continue IV Solumedrol 60 mg q6h-decrease the dose to 40 every 12 as hypercarbic  failure most likely related to underlying obesity hypoventilation syndrome plus minus diastolic heart failure -Continue bronchodilators -Follow intermittent CXR and ABG -Pt is a DNR -We will OT and PT consult - Out of bed to chair -BiPAP at nighttime -- Check TSH  A: Acute on Chronic CHF Mildly elevated BNP -BNP 146 P: -Cardiac monitoring -IV Lasix 40 mg BID x2 doses (pt takes 40 mg BID PO Lasix at home) -Follow BMP with diuresis -Consider Echocardiogram-echo done in July showed EF of 55 to 60%  A: Hyperglycemia P: CBG's SSI Follow ICU Hypo/hyperglycemia protocol   Continue home synthroid and coreg.   DISPOSITION: Avel Peace doing well can be transferred as a fluid status GOALS OF CARE: DNR VTE PROPHYLAXIS: Lovenox UPDATES: Updated pt at bedside 12/28/17   Harlon Ditty, AGACNP-BC [facility] Pager: [phone]  STAFF NOTE: I, Dr Roseanne Reno have personally reviewed Rebecca Bird's available data, including medical history, events of note, physical examination and test results as part of my evaluation. I have discussed with resident/NP and other care providers such as pharmacist, RN and RRT.  In addition,  I  personally evaluated Rebecca Bird and elicited key findings of   -Rebecca Bird was independently seen and examined, chart reviewed history reviewed with the Rebecca Bird labs reviewed with the Rebecca Bird Case discussed in the round, adjustments were made in the note   Skin/Wound: Chronic skin changes  Electrolytes: Replace electrolytes per ICU electrolyte replacement protocol.   IVF: none  Nutrition: Diet as tolerated  Prophylaxis: DVT Prophylaxis with heparin,. GI Prophylaxis.   Restraints: None  PT/OT eval and treat. OOB when appropriate.   Lines/Tubes: No Foley no central line.  ADVANCE DIRECTIVE: DNR  FAMILY DISCUSSION: Discussed by ICU team last night talk with the Rebecca Bird today morning  Quality Care: PPI, DVT prophylaxis, HOB  elevated, Infection control all reviewed and addressed.  Events and notes from last 24 hours reviewed. Care plan discussed on multidisciplinary rounds  CC TIME: 35 minutes   Old records reviewed discussed results and management plan with Rebecca Bird  Images personally reviewed and results and labs reviewed and discussed with Rebecca Bird.  All medication reviewed and adjusted  Further management depending on test results and work up as outlined above.    Roseanne Reno, M.D  12/28/2017, 12:11 AM

## 2017-12-28 NOTE — Progress Notes (Signed)
Transported pt to ICU 19 on Bipap without incident. Pt remains on Bipap and tol well at this time. Report given to ICU RT.

## 2017-12-28 NOTE — Progress Notes (Signed)
Spoke with patient's daughter Duwayne Heck [id number] explained patient's condition at length all the questions were answered.  Patient's daughter is reasonable and understanding patient's overall decline in the condition she mentioned that if her condition does not improve they have discussed long-term plan on patient and they would like to discuss it again however they would like to continue with the current measures at this point.  Kaizlee Carlino Sherryll [facility] Pul[facility] Critical Care & Sleep Medicine

## 2017-12-28 NOTE — Progress Notes (Signed)
PT Cancellation Note  Patient Details Name: Rebecca Bird MRN: [id number] DOB: [birth year]   Cancelled Treatment:     Order received, chart reviewed. Pt on BiPAP, inappropriate for PT at this time. Will attempt eval at later date/time as pt is medically appropriate.  Arvilla Meres, SPT  Arvilla Meres 12/28/2017, 2:12 PM

## 2017-12-28 NOTE — Progress Notes (Signed)
Pt not able to put arm in the correct placement for PICC insertion. Pt is c/o that her arm hurts every time it is repositioned. MD made aware and ordered precedex for PICC placement. PICC now in place and Precedex stopped. Will continue to monitor.

## 2017-12-28 NOTE — Progress Notes (Signed)
Pt transferred to "hercules" bed, with no complications.

## 2017-12-28 NOTE — Progress Notes (Signed)
Peripherally Inserted Central Catheter/Midline Placement  The IV Nurse has discussed with the patient and/or persons authorized to consent for the patient, the purpose of this procedure and the potential benefits and risks involved with this procedure.  The benefits include less needle sticks, lab draws from the catheter, and the patient may be discharged home with the catheter. Risks include, but not limited to, infection, bleeding, blood clot (thrombus formation), and puncture of an artery; nerve damage and irregular heartbeat and possibility to perform a PICC exchange if needed/ordered by physician.  Alternatives to this procedure were also discussed.  Bard Power PICC patient education guide, fact sheet on infection prevention and patient information card has been provided to patient /or left at bedside.    PICC/Midline Placement Documentation  PICC Double Lumen 12/28/17 PICC Left Basilic 53 cm 1 cm (Active)  Indication for Insertion or Continuance of Line Limited venous access - need for IV therapy >5 days (PICC only) 12/28/2017  5:26 PM  Exposed Catheter (cm) 0 cm 12/28/2017  5:26 PM  Site Assessment Clean;Dry;Intact 12/28/2017  5:26 PM  Lumen #1 Status Flushed;Saline locked;Blood return noted 12/28/2017  5:26 PM  Lumen #2 Status Flushed;Saline locked;Blood return noted 12/28/2017  5:26 PM  Dressing Type Transparent;Gauze 12/28/2017  5:26 PM  Dressing Status Clean;Dry;Intact;Antimicrobial disc in place 12/28/2017  5:26 PM  Dressing Change Due 12/30/17 12/28/2017  5:26 PM       Ethelda Chick 12/28/2017, 5:30 PM

## 2017-12-28 NOTE — Progress Notes (Signed)
Told pt that it was time to take a break from the bipap and try the nasal cannula for a bit. Pt refused to let RN take off bipap. Will continue to monitor.

## 2017-12-28 NOTE — Progress Notes (Signed)
[employer] [facility] [place] [facility]   PATIENT NAME: Rebecca Bird    MR#:  [id number]  DATE OF BIRTH:  1961-04-04  SUBJECTIVE:  CHIEF COMPLAINT:   Chief Complaint  Patient presents with  . Shortness of Breath   Came with SOB, morbidly obese- On Bipap.  Drowsy.  REVIEW OF SYSTEMS:   Pt was drowsy. Can not give ROS.   ROS  DRUG ALLERGIES:   Allergies  Allergen Reactions  . Levaquin [Levofloxacin In D5w] Itching and Rash    VITALS:  Blood pressure 125/69, pulse 89, temperature 97.8 F (36.6 C), temperature source Oral, resp. rate (!) 23, height 5\' 5"  (1.651 m), weight (!) 204.7 kg, SpO2 93 %.  PHYSICAL EXAMINATION:  GENERAL:  57 y.o.-year-old morbidly obese patient lying in the bed with no acute distress.  EYES: Pupils equal, round, reactive to light and accommodation. No scleral icterus. Extraocular muscles intact.  HEENT: Head atraumatic, normocephalic. Oropharynx and nasopharynx clear.  NECK:  Supple, no jugular venous distention. No thyroid enlargement, no tenderness.  LUNGS: Normal breath sounds bilaterally, no wheezing, rales,rhonchi or crepitation. No use of accessory muscles of respiration. On bipap. CARDIOVASCULAR: S1, S2 normal. No murmurs, rubs, or gallops.  ABDOMEN: Soft, nontender, nondistended. Bowel sounds present. No organomegaly or mass.  EXTREMITIES: No pedal edema, cyanosis, or clubbing.  NEUROLOGIC: drowsy, on bipap. PSYCHIATRIC: The patient is on bipap, drowsy.  SKIN: No obvious rash, lesion, or ulcer.   Physical Exam LABORATORY PANEL:   CBC Recent Labs  Lab 12/28/17 1526  WBC 4.8  HGB 13.1  HCT 39.8  PLT 190   ------------------------------------------------------------------------------------------------------------------  Chemistries  Recent Labs  Lab 12/28/17 2023  NA 145  K 4.1  CL 86*  CO2 >50*  GLUCOSE 148*  BUN 23*  CREATININE 0.57  CALCIUM 9.3    ------------------------------------------------------------------------------------------------------------------  Cardiac Enzymes Recent Labs  Lab 12/27/17 2150  TROPONINI <0.03   ------------------------------------------------------------------------------------------------------------------  RADIOLOGY:  Dg Chest 1 View  Result Date: 12/27/2017 CLINICAL DATA:  Dyspnea, hypoxia EXAM: CHEST  1 VIEW COMPARISON:  11/18/2017 chest radiograph. FINDINGS: Stable cardiomediastinal silhouette with moderate cardiomegaly. No pneumothorax. No pleural effusion. Fluffy diffuse parahilar opacities in the lungs, similar to prior. IMPRESSION: Moderate cardiomegaly. Fluffy parahilar opacities throughout both lungs, favor moderate pulmonary edema. Electronically Signed   By: Delbert Phenix M.D.   On: 12/27/2017 22:42   [place] Ekg Site Rite  Result Date: 12/28/2017 If Site Rite image not attached, placement could not be confirmed due to current cardiac rhythm.   ASSESSMENT AND PLAN:   Principal Problem:   Acute on chronic respiratory failure with hypoxia and hypercapnia (HCC) Active Problems:   OSA (obstructive sleep apnea)   COPD with acute exacerbation (HCC)   HTN (hypertension)   Hypothyroidism  *  Acute on chronic respiratory failure with hypoxia and hypercapnia (HCC) -patient is currently on BiPAP,   maintain BiPAP for now.    Manage per ICU.  *  COPD with acute exacerbation (HCC) -IV Solu-Medrol, duo nebs, continue home meds  *  OSA (obstructive sleep apnea) -BiPAP as above *  HTN (hypertension) -continue home medications *  Hypothyroidism -home dose thyroid replacement  All the records are reviewed and case discussed with Care Management/Social Workerr. Management plans discussed with the patient, family and they are in agreement.  CODE STATUS: DNR  TOTAL TIME TAKING CARE OF THIS PATIENT: 35 minutes.   POSSIBLE D/C IN 1-2 DAYS, DEPENDING ON CLINICAL CONDITION.   Altamese Dilling M.D  on 12/28/2017   Between 7am to 6pm - Pager - [phone]  After 6pm go to [email or web address] - password [email or web address]  [facility]  Office  [phone]  CC: Primary care physician; Dinges, Roanna Banning, NP  Note: This dictation was prepared with Dragon dictation along with smaller phrase technology. Any transcriptional errors that result from this process are unintentional.

## 2017-12-28 NOTE — Progress Notes (Signed)
OT Cancellation Note  Patient Details Name: Rebecca Bird MRN: [id number] DOB: October 07, 1960   Cancelled Treatment:    Reason Eval/Treat Not Completed: Other (comment);Patient not medically ready. Order received, chart reviewed. Pt on BiPAP, inappropriate for OT at this time. Will re-attempt at later date/time as pt is medically appropriate.   Richrd PrimeJamie Stiller, MPH, MS, OTR/L ascom [phone] 12/28/17, 2:09 PM

## 2017-12-28 NOTE — Progress Notes (Signed)
BMP was checked on patient for not sure what reason but showed K of 5.9  Kayexalate and insulin and dext  Rpt BMP in evening  Fallon Haecker [facility] Pulmonary Critical Care & Sleep Medicine

## 2017-12-29 ENCOUNTER — Inpatient Hospital Stay: Payer: Medicare Other

## 2017-12-29 LAB — COMPREHENSIVE METABOLIC PANEL
ALT: 32 U/L (ref 0–44)
ANION GAP: 13 (ref 5–15)
AST: 21 U/L (ref 15–41)
Albumin: 3.2 g/dL — ABNORMAL LOW (ref 3.5–5.0)
Alkaline Phosphatase: 83 U/L (ref 38–126)
BUN: 22 mg/dL — ABNORMAL HIGH (ref 6–20)
CALCIUM: 9.1 mg/dL (ref 8.9–10.3)
CO2: 46 mmol/L — AB (ref 22–32)
Chloride: 85 mmol/L — ABNORMAL LOW (ref 98–111)
Creatinine, Ser: 0.65 mg/dL (ref 0.44–1.00)
GFR calc non Af Amer: >60 mL/min (ref >60)
Glucose, Bld: 120 mg/dL — ABNORMAL HIGH (ref 70–99)
Potassium: 4.3 mmol/L (ref 3.5–5.1)
SODIUM: 144 mmol/L (ref 135–145)
Total Bilirubin: 0.8 mg/dL (ref 0.3–1.2)
Total Protein: 6.3 g/dL — ABNORMAL LOW (ref 6.5–8.1)

## 2017-12-29 LAB — BASIC METABOLIC PANEL
BUN: 23 mg/dL — ABNORMAL HIGH (ref 6–20)
CALCIUM: 9.3 mg/dL (ref 8.9–10.3)
CHLORIDE: 86 mmol/L — AB (ref 98–111)
CREATININE: 0.57 mg/dL (ref 0.44–1.00)
GFR calc Af Amer: >60 mL/min (ref >60)
GFR calc non Af Amer: >60 mL/min (ref >60)
Glucose, Bld: 148 mg/dL — ABNORMAL HIGH (ref 70–99)
Potassium: 4.1 mmol/L (ref 3.5–5.1)
Sodium: 145 mmol/L (ref 135–145)

## 2017-12-29 LAB — BLOOD GAS, ARTERIAL
Acid-Base Excess: 28.1 mmol/L — ABNORMAL HIGH (ref 0.0–2.0)
Bicarbonate: 59.2 mmol/L — ABNORMAL HIGH (ref 20.0–28.0)
Delivery systems: POSITIVE
EXPIRATORY PAP: 6
FIO2: 40
INSPIRATORY PAP: 14
Mechanical Rate: 8
O2 SAT: 96.3 %
PCO2 ART: 100 mmHg — AB (ref 32.0–48.0)
PH ART: 7.38 (ref 7.350–7.450)
PO2 ART: 86 mmHg (ref 83.0–108.0)
Patient temperature: 37

## 2017-12-29 LAB — GLUCOSE, CAPILLARY
GLUCOSE-CAPILLARY: 118 mg/dL — AB (ref 70–99)
GLUCOSE-CAPILLARY: 123 mg/dL — AB (ref 70–99)
GLUCOSE-CAPILLARY: 153 mg/dL — AB (ref 70–99)
Glucose-Capillary: 102 mg/dL — ABNORMAL HIGH (ref 70–99)
Glucose-Capillary: 120 mg/dL — ABNORMAL HIGH (ref 70–99)
Glucose-Capillary: 122 mg/dL — ABNORMAL HIGH (ref 70–99)
Glucose-Capillary: 147 mg/dL — ABNORMAL HIGH (ref 70–99)
Glucose-Capillary: 147 mg/dL — ABNORMAL HIGH (ref 70–99)

## 2017-12-29 LAB — PROTIME-INR
INR: 0.95
PROTHROMBIN TIME: 12.6 s (ref 11.4–15.2)

## 2017-12-29 LAB — MAGNESIUM: Magnesium: 2 mg/dL (ref 1.7–2.4)

## 2017-12-29 LAB — CBC WITH DIFFERENTIAL/PLATELET
Basophils Absolute: 0 10*3/uL (ref 0–0.1)
Basophils Relative: 0 %
EOS ABS: 0 10*3/uL (ref 0–0.7)
EOS PCT: 0 %
HCT: 35.4 % (ref 35.0–47.0)
HEMOGLOBIN: 11.4 g/dL — AB (ref 12.0–16.0)
LYMPHS ABS: 0.7 10*3/uL — AB (ref 1.0–3.6)
Lymphocytes Relative: 13 %
MCH: 32 pg (ref 26.0–34.0)
MCHC: 32.2 g/dL (ref 32.0–36.0)
MCV: 99.5 fL (ref 80.0–100.0)
MONO ABS: 0.5 10*3/uL (ref 0.2–0.9)
MONOS PCT: 8 %
Neutro Abs: 4.4 10*3/uL (ref 1.4–6.5)
Neutrophils Relative %: 79 %
PLATELETS: 202 10*3/uL (ref 150–440)
RBC: 3.56 MIL/uL — ABNORMAL LOW (ref 3.80–5.20)
RDW: 17 % — ABNORMAL HIGH (ref 11.5–14.5)
WBC: 5.5 10*3/uL (ref 3.6–11.0)

## 2017-12-29 LAB — HEMOGLOBIN A1C
HEMOGLOBIN A1C: 5.5 % (ref 4.8–5.6)
MEAN PLASMA GLUCOSE: 111.15 mg/dL

## 2017-12-29 LAB — APTT: aPTT: 38 seconds — ABNORMAL HIGH (ref 24–36)

## 2017-12-29 LAB — HIV ANTIBODY (ROUTINE TESTING W REFLEX): HIV SCREEN 4TH GENERATION: NONREACTIVE

## 2017-12-29 MED ORDER — ACETAZOLAMIDE SODIUM 500 MG IJ SOLR
250.0000 mg | Freq: Once | INTRAMUSCULAR | Status: AC
  Administered 2017-12-29: 250 mg via INTRAVENOUS
  Filled 2017-12-29: qty 250

## 2017-12-29 MED ORDER — PHENOL 1.4 % MT LIQD
1.0000 | OROMUCOSAL | Status: DC | PRN
  Administered 2017-12-29 (×2): 1 via OROMUCOSAL
  Filled 2017-12-29: qty 177

## 2017-12-29 MED ORDER — STERILE WATER FOR INJECTION IJ SOLN
INTRAMUSCULAR | Status: AC
  Administered 2017-12-29: 10 mL
  Filled 2017-12-29: qty 10

## 2017-12-29 MED ORDER — METHYLPREDNISOLONE SODIUM SUCC 40 MG IJ SOLR
30.0000 mg | Freq: Two times a day (BID) | INTRAMUSCULAR | Status: DC
  Administered 2017-12-29 – 2017-12-30 (×2): 30 mg via INTRAVENOUS
  Filled 2017-12-29 (×2): qty 1

## 2017-12-29 NOTE — Progress Notes (Signed)
OT Cancellation Note  Patient Details Name: TARAE WOODEN MRN: [id number] DOB: 1960/11/24   Cancelled Treatment:    Reason Eval/Treat Not Completed: Medical issues which prohibited therapy. Spoke with RN. Pt remains on BiPAP. RN notes attempts to take off BiPAP today. Will continue to follow acutely for appropriateness for OT evaluation.   Richrd Prime, MPH, MS, OTR/L ascom [phone] 12/29/17, 7:57 AM

## 2017-12-29 NOTE — Evaluation (Signed)
Physical Therapy Evaluation Patient Details Name: Rebecca Bird MRN: [id number] DOB: [birth year] Today's Date: 12/29/2017   History of Present Illness  Pt admitted for acute on chronic respiratory failure with hypoxia. Pt with complaints of SOB. History includes anemia, CHF, COPD, depression, and HTN. Per patient, has been at SNF receiving PT/OT for last 2 months. Planning to return there at discharge.  Clinical Impression  Pt is a pleasant 57 year old female who was admitted for acute on chronic respiratory failure. Pt performs bed mobility with mod A +2. Pt demonstrates deficits with strength/endurance/mobility. Has been participating in rehab at [facility] and is working on progressing to sitting at EOB and seated balance. Currently limited by use of R arm and pain on R LE. Pt appears motivated to participate however is currently not at baseline level. Would benefit from skilled PT to address above deficits and promote optimal return to PLOF; recommend transition to STR upon discharge from acute hospitalization.       Follow Up Recommendations SNF    Equipment Recommendations  None recommended by PT    Recommendations for Other Services       Precautions / Restrictions Precautions Precautions: Fall Restrictions Weight Bearing Restrictions: No      Mobility  Bed Mobility Overal bed mobility: Needs Assistance Bed Mobility: Rolling Rolling: Mod assist;+2 for physical assistance;+2 for safety/equipment         General bed mobility comments: Pt needed bed pan placement, able to roll to L side with mod assist +2. Unable to maintain sidelying position and needs assist for return supine. Unable to tolerate further mobility efforts as she needed to have BM. Asked PT to step out.  Transfers                 General transfer comment: deferred  Ambulation/Gait                Stairs            Wheelchair Mobility    Modified Rankin (Stroke Patients Only)        Balance Overall balance assessment: (unable to test)                                           Pertinent Vitals/Pain Pain Assessment: Faces Faces Pain Scale: Hurts even more Pain Location: R shoulder, L knee, L ankle Pain Descriptors / Indicators: Sore Pain Intervention(s): Limited activity within patient's tolerance;Repositioned    Home Living Family/patient expects to be discharged to:: Skilled nursing facility                 Additional Comments: prior to SNF placement, pt was living home alone. Plans to return to SNF to finish rehab and then transition to LTC    Prior Function Level of Independence: Needs assistance   Gait / Transfers Assistance Needed: Pt was getting assist with all transfer needs at SNF     Comments: Per patient, receiving PT/OT and has sat at EOB x 2 in the last 2 months. Unable to tolerate standing attempts at this time. Mostly been bedbound     Hand Dominance        Extremity/Trunk Assessment   Upper Extremity Assessment Upper Extremity Assessment: Generalized weakness;RUE deficits/detail(L UE grossly 3/5) RUE Deficits / Details: per report, pt with R shoulder dislocation, scheduled for consult with surgeon, however delayed due to medical  status. Reports she is unable to lift R arm off bed. Able to demonstrate R wrist flexion. RUE Sensation: WNL    Lower Extremity Assessment Lower Extremity Assessment: Generalized weakness(L LE grossly 2+/5; R LE grossly 3/5)       Communication   Communication: No difficulties  Cognition Arousal/Alertness: Awake/alert Behavior During Therapy: WFL for tasks assessed/performed Overall Cognitive Status: Within Functional Limits for tasks assessed                                        General Comments      Exercises Other Exercises Other Exercises: Supine B LE ther-ex performed including ankle pumps, heel slides, SLRs. All ther-ex performed x 10 reps with mod A  on L LE and cga on R LE.   Assessment/Plan    PT Assessment Patient needs continued PT services  PT Problem List Decreased strength;Decreased activity tolerance;Decreased balance;Decreased mobility;Cardiopulmonary status limiting activity;Obesity;Pain       PT Treatment Interventions Gait training;DME instruction;Therapeutic exercise;Balance training    PT Goals (Current goals can be found in the Care Plan section)  Acute Rehab PT Goals Patient Stated Goal: to go back to rehab PT Goal Formulation: With patient Time For Goal Achievement: 01/12/18 Potential to Achieve Goals: Fair    Frequency Min 2X/week   Barriers to discharge        Co-evaluation               AM-PAC PT "6 Clicks" Daily Activity  Outcome Measure Difficulty turning over in bed (including adjusting bedclothes, sheets and blankets)?: Unable Difficulty moving from lying on back to sitting on the side of the bed? : Unable Difficulty sitting down on and standing up from a chair with arms (e.g., wheelchair, bedside commode, etc,.)?: Unable Help needed moving to and from a bed to chair (including a wheelchair)?: Total Help needed walking in hospital room?: Total Help needed climbing 3-5 steps with a railing? : Total 6 Click Score: 6    End of Session Equipment Utilized During Treatment: Oxygen Activity Tolerance: Patient tolerated treatment well Patient left: in bed;with nursing/sitter in room Nurse Communication: Mobility status PT Visit Diagnosis: Muscle weakness (generalized) (M62.81);Difficulty in walking, not elsewhere classified (R26.2);Pain Pain - Right/Left: Right Pain - part of body: Shoulder    Time: 1610-9604 PT Time Calculation (min) (ACUTE ONLY): 22 min   Charges:   PT Evaluation $PT Eval Low Complexity: 1 Low PT Treatments $Therapeutic Exercise: 8-22 mins        Elizabeth Palau, PT, DPT [phone]   [name] 12/29/2017, 11:09 AM

## 2017-12-29 NOTE — Progress Notes (Signed)
Name: Rebecca Bird MRN: [id number] DOB: Jan 30, 1961    ADMISSION DATE:  12/27/2017  BRIEF PATIENT DESCRIPTION:  57 y.o. Female admitted with Acute Hypoxic Hypercapnic Respiratory Failure requiring BiPAP in setting of Acute on Chronic CHF and COPD exacerbation  SIGNIFICANT EVENTS/STUDIES:  12/28/2017>> Admission to [facility]  REVIEW OF SYSTEMS:   Constitutional: Negative for fever, chills, weight loss, malaise/fatigue and diaphoresis.  HENT: Negative for hearing loss, ear pain, nosebleeds, congestion, sore throat, neck pain, tinnitus and ear discharge.   Eyes: Negative for blurred vision, double vision, photophobia, pain, discharge and redness.  Respiratory: Negative for cough, hemoptysis, sputum production, shortness of breath, wheezing and stridor.   Cardiovascular: Negative for chest pain, palpitations, orthopnea, claudication, leg swelling and PND.  Gastrointestinal: Negative for heartburn, nausea, vomiting, abdominal pain, diarrhea, constipation, blood in stool and melena.  Genitourinary: Negative for dysuria, urgency, frequency, hematuria and flank pain.  Musculoskeletal: Negative for myalgias, back pain, joint pain and falls.  Skin: Negative for itching and rash.  Neurological: Negative for dizziness, tingling, tremors, sensory change, speech change, focal weakness, seizures, loss of consciousness, weakness and headaches.  Endo/Heme/Allergies: Negative for environmental allergies and polydipsia. Does not bruise/bleed easily.  SUBJECTIVE:  Pt states she feels better not complaints at this time  VITAL SIGNS: Temp:  [97.6 F (36.4 C)-98.8 F (37.1 C)] 98.8 F (37.1 C) (09/26 0800) Pulse Rate:  [76-95] 95 (09/26 0807) Resp:  [17-25] 18 (09/26 0800) BP: (91-147)/(19-98) 147/89 (09/26 0807) SpO2:  [89 %-98 %] 93 % (09/26 0848) FiO2 (%):  [35 %-40 %] 40 % (09/26 0144)  PHYSICAL EXAMINATION: General: well developed, well nourished obese female, NAD resting in bed  Neuro:  alert and oriented HEENT: large neck unable to assess for JVD  Cardiovascular: nsr, rrr, no R/G Lungs: diminished throughout, even, non labored  Abdomen: +BS x4, obese, soft, non tender, non distended  Musculoskeletal: normal tone, 1+ bilateral lower extremity edema Skin: intact no rashes or lesions   Recent Labs  Lab 12/28/17 1451 12/28/17 2023 12/29/17 0324  NA 144 145 144  K 5.9* 4.1 4.3  CL 86* 86* 85*  CO2 44* >50* 46*  BUN 21* 23* 22*  CREATININE 0.70 0.57 0.65  GLUCOSE 118* 148* 120*   Recent Labs  Lab 12/27/17 2150 12/28/17 1526 12/29/17 0324  HGB 12.0 13.1 11.4*  HCT 37.7 39.8 35.4  WBC 5.7 4.8 5.5  PLT 190 190 202   Dg Chest 1 View  Result Date: 12/27/2017 CLINICAL DATA:  Dyspnea, hypoxia EXAM: CHEST  1 VIEW COMPARISON:  11/18/2017 chest radiograph. FINDINGS: Stable cardiomediastinal silhouette with moderate cardiomegaly. No pneumothorax. No pleural effusion. Fluffy diffuse parahilar opacities in the lungs, similar to prior. IMPRESSION: Moderate cardiomegaly. Fluffy parahilar opacities throughout both lungs, favor moderate pulmonary edema. Electronically Signed   By: Delbert Phenix M.D.   On: 12/27/2017 22:42   [place] Ekg Site Rite  Result Date: 12/28/2017 If Site Rite image not attached, placement could not be confirmed due to current cardiac rhythm.   ASSESSMENT / PLAN:  A: Acute hypoxic hypercapnic respiratory failure in setting of acute on chronic CHF, AECOPD, and OHS/OSA P: Supplemental O2 to maintain O2 sats 88 to 92% Will need Bipap qhs and during naps   Scheduled and prn bronchodilator therapy  Continue iv steroids at 40 mg q12hrs will wean as tolerated  Prn ABG's  A: Acute on chronic diastolic CHF (Echo 10/2017: EF 55-60%) P: Continuous telemetry monitoring  Continue outpatient lasix 40 mg bid  and carvedilol Trend BMP, replace electrolytes, and monitor UOP   A: Hyperglycemia Hx: Hypothyroidism  P: CBG and SSI Follow ICU Hypo/hyperglycemia  protocol Will check hemoglobin A1c Continue outpatient synthroid  VTE px: subq: lovenox  Continue heart healthy/carb modified   Sonda Rumble, AGNP  Pulmonary/Critical Care Pager [phone] (please enter 7 digits) PCCM Consult Pager [phone] (please enter 7 digits)

## 2017-12-29 NOTE — Progress Notes (Addendum)
[facility] - [place] at [facility]   PATIENT NAME: Rebecca Bird    MR#:  [id number]  DATE OF BIRTH:  26-Feb-1961  SUBJECTIVE:  CHIEF COMPLAINT:   Chief Complaint  Patient presents with  . Shortness of Breath   Came with SOB, morbidly obese- Off Bipap. Feeling better today  REVIEW OF SYSTEMS:   REVIEW OF SYSTEMS:   Constitutional: Negative for fever, chills, weight loss, malaise/fatigue and diaphoresis.  HENT: Negative for hearing loss, ear pain, nosebleeds, congestion, sore throat, neck pain, tinnitus and ear discharge.   Eyes: Negative for blurred vision, double vision, photophobia, pain, discharge and redness.  Respiratory: Negative for cough, hemoptysis, sputum production, shortness of breath, wheezing and stridor.   Cardiovascular: Negative for chest pain, palpitations, orthopnea, claudication, leg swelling and PND.  Gastrointestinal: Negative for heartburn, nausea, vomiting, abdominal pain, diarrhea, constipation, blood in stool and melena.  Genitourinary: Negative for dysuria, urgency, frequency, hematuria and flank pain.  Musculoskeletal: Negative for myalgias, back pain, joint pain and falls.  Skin: Negative for itching and rash.  Neurological: Negative for dizziness, tingling, tremors, sensory change, speech change, focal weakness, seizures, loss of consciousness, weakness and headaches.   DRUG ALLERGIES:   Allergies  Allergen Reactions  . Levaquin [Levofloxacin In D5w] Itching and Rash    VITALS:  Blood pressure 118/70, pulse 83, temperature 98.4 F (36.9 C), temperature source Axillary, resp. rate 16, height 5\' 5"  (1.651 m), weight (!) 204.7 kg, SpO2 93 %.  PHYSICAL EXAMINATION:  GENERAL:  57 y.o.-year-old morbidly obese patient lying in the bed with no acute distress.  EYES: Pupils equal, round, reactive to light and accommodation. No scleral icterus. Extraocular muscles intact.  HEENT: Head atraumatic, normocephalic. Oropharynx and nasopharynx  clear.  NECK:  Supple, no jugular venous distention. No thyroid enlargement, no tenderness.  LUNGS: Normal breath sounds bilaterally, no wheezing, rales,rhonchi or crepitation. No use of accessory muscles of respiration. On bipap. CARDIOVASCULAR: S1, S2 normal. No murmurs, rubs, or gallops.  ABDOMEN: Soft, nontender, nondistended. Bowel sounds present. No organomegaly or mass.  EXTREMITIES: No pedal edema, cyanosis, or clubbing.  NEUROLOGIC: drowsy, on bipap. PSYCHIATRIC: The patient is on bipap, drowsy.  SKIN: No obvious rash, lesion, or ulcer.    LABORATORY PANEL:   CBC Recent Labs  Lab 12/29/17 0324  WBC 5.5  HGB 11.4*  HCT 35.4  PLT 202   ------------------------------------------------------------------------------------------------------------------  Chemistries  Recent Labs  Lab 12/29/17 0324  NA 144  K 4.3  CL 85*  CO2 46*  GLUCOSE 120*  BUN 22*  CREATININE 0.65  CALCIUM 9.1  MG 2.0  AST 21  ALT 32  ALKPHOS 83  BILITOT 0.8   ------------------------------------------------------------------------------------------------------------------  Cardiac Enzymes Recent Labs  Lab 12/27/17 2150  TROPONINI <0.03   ------------------------------------------------------------------------------------------------------------------  RADIOLOGY:  Dg Chest 1 View  Result Date: 12/27/2017 CLINICAL DATA:  Dyspnea, hypoxia EXAM: CHEST  1 VIEW COMPARISON:  11/18/2017 chest radiograph. FINDINGS: Stable cardiomediastinal silhouette with moderate cardiomegaly. No pneumothorax. No pleural effusion. Fluffy diffuse parahilar opacities in the lungs, similar to prior. IMPRESSION: Moderate cardiomegaly. Fluffy parahilar opacities throughout both lungs, favor moderate pulmonary edema. Electronically Signed   By: Delbert Phenix M.D.   On: 12/27/2017 22:42   Dg Chest Port 1 View  Result Date: 12/29/2017 CLINICAL DATA:  Shortness of breath. EXAM: PORTABLE CHEST 1 VIEW COMPARISON:   Radiograph of December 27, 2017. FINDINGS: Stable cardiomegaly with central pulmonary vascular congestion and probable bilateral pulmonary edema. No pneumothorax or definite pleural effusion  is noted. Bony thorax is unremarkable. IMPRESSION: Stable cardiomegaly with central pulmonary vascular congestion and probable bilateral pulmonary edema. Electronically Signed   By: Lupita Raider, M.D.   On: 12/29/2017 09:14   [place] Ekg Site Rite  Result Date: 12/28/2017 If Site Rite image not attached, placement could not be confirmed due to current cardiac rhythm.   ASSESSMENT AND PLAN:   Principal Problem:   Acute on chronic respiratory failure with hypoxia and hypercapnia (HCC) Active Problems:   OSA (obstructive sleep apnea)   COPD with acute exacerbation (HCC)   HTN (hypertension)   Hypothyroidism  *  Acute on chronic respiratory failure with hypoxia and hypercapnia (HCC) secondary to acute on chronic CHF, acute exacerbation of COPD with underlying obstructive sleep apnea -patient is currently off BiPAP,  Manage per ICU.  *  COPD with acute exacerbation (HCC) -IV Solu-Medrol, duo nebs, continue home meds  *Acute on chronic diastolic CHF ejection fraction 55 to 60% July 2019 Lasix 40 mg IV twice daily and Coreg. Monitor renal function Intake and output and daily weights  *  OSA (obstructive sleep apnea) -BiPAP as needed  *  HTN (hypertension) -continue home medications  *  Hypothyroidism -home dose thyroid replacement  All the records are reviewed and case discussed with Care Management/Social Workerr. Management plans discussed with the patient, family and they are in agreement.  CODE STATUS: DNR  TOTAL TIME TAKING CARE OF THIS PATIENT: 35 minutes.   POSSIBLE D/C IN 1-2 DAYS, DEPENDING ON CLINICAL CONDITION.   Ramonita Lab M.D on 12/29/2017   Between 7am to 6pm - Pager - [phone]  After 6pm go to [email or web address] - password [email or web address]  [facility]  Office   [phone]  CC: Primary care physician; Dinges, Roanna Banning, NP  Note: This dictation was prepared with Dragon dictation along with smaller phrase technology. Any transcriptional errors that result from this process are unintentional.

## 2017-12-30 ENCOUNTER — Inpatient Hospital Stay: Payer: Medicare Other

## 2017-12-30 LAB — CBC WITH DIFFERENTIAL/PLATELET
Basophils Absolute: 0 10*3/uL (ref 0–0.1)
Basophils Relative: 0 %
Eosinophils Absolute: 0 10*3/uL (ref 0–0.7)
Eosinophils Relative: 1 %
HCT: 35.5 % (ref 35.0–47.0)
HEMOGLOBIN: 11.8 g/dL — AB (ref 12.0–16.0)
Lymphocytes Relative: 16 %
Lymphs Abs: 0.7 10*3/uL — ABNORMAL LOW (ref 1.0–3.6)
MCH: 32.9 pg (ref 26.0–34.0)
MCHC: 33.3 g/dL (ref 32.0–36.0)
MCV: 98.8 fL (ref 80.0–100.0)
MONOS PCT: 9 %
Monocytes Absolute: 0.4 10*3/uL (ref 0.2–0.9)
NEUTROS PCT: 74 %
Neutro Abs: 3.1 10*3/uL (ref 1.4–6.5)
Platelets: 197 10*3/uL (ref 150–440)
RBC: 3.59 MIL/uL — ABNORMAL LOW (ref 3.80–5.20)
RDW: 16.8 % — ABNORMAL HIGH (ref 11.5–14.5)
WBC: 4.2 10*3/uL (ref 3.6–11.0)

## 2017-12-30 LAB — GLUCOSE, CAPILLARY
GLUCOSE-CAPILLARY: 102 mg/dL — AB (ref 70–99)
GLUCOSE-CAPILLARY: 157 mg/dL — AB (ref 70–99)
Glucose-Capillary: 140 mg/dL — ABNORMAL HIGH (ref 70–99)
Glucose-Capillary: 117 mg/dL — ABNORMAL HIGH (ref 70–99)
Glucose-Capillary: 140 mg/dL — ABNORMAL HIGH (ref 70–99)
Glucose-Capillary: 182 mg/dL — ABNORMAL HIGH (ref 70–99)
Glucose-Capillary: 164 mg/dL — ABNORMAL HIGH (ref 70–99)

## 2017-12-30 LAB — DRUG PROFILE, UR, 9 DRUGS (LABCORP)
AMPHETAMINES, URINE: NEGATIVE ng/mL
BARBITURATE, UR: NEGATIVE ng/mL
BENZODIAZEPINE QUANT UR: NEGATIVE ng/mL
COCAINE (METAB.): NEGATIVE ng/mL
Cannabinoid Quant, Ur: NEGATIVE ng/mL
Methadone Screen, Urine: NEGATIVE ng/mL
OPIATE QUANT UR: NEGATIVE ng/mL
Phencyclidine, Ur: NEGATIVE ng/mL
Propoxyphene, Urine: NEGATIVE ng/mL

## 2017-12-30 LAB — BASIC METABOLIC PANEL
Anion gap: 6 (ref 5–15)
BUN: 30 mg/dL — AB (ref 6–20)
CHLORIDE: 89 mmol/L — AB (ref 98–111)
CO2: 49 mmol/L — AB (ref 22–32)
CREATININE: 0.67 mg/dL (ref 0.44–1.00)
Calcium: 8.8 mg/dL — ABNORMAL LOW (ref 8.9–10.3)
GFR calc Af Amer: >60 mL/min (ref >60)
GFR calc non Af Amer: >60 mL/min (ref >60)
Glucose, Bld: 115 mg/dL — ABNORMAL HIGH (ref 70–99)
Potassium: 4.4 mmol/L (ref 3.5–5.1)
Sodium: 144 mmol/L (ref 135–145)

## 2017-12-30 MED ORDER — PREDNISONE 10 MG PO TABS: 10.0000 mg | ORAL_TABLET | Freq: Every day | ORAL | Status: DC

## 2017-12-30 MED ORDER — PREDNISONE 10 MG PO TABS: 20.0000 mg | ORAL_TABLET | Freq: Every day | ORAL | Status: DC

## 2017-12-30 MED ORDER — PREDNISONE 10 MG PO TABS
50.0000 mg | ORAL_TABLET | Freq: Every day | ORAL | Status: DC
  Administered 2017-12-31: 50 mg via ORAL
  Filled 2017-12-30: qty 5

## 2017-12-30 MED ORDER — PREDNISONE 10 MG PO TABS: 30.0000 mg | ORAL_TABLET | Freq: Every day | ORAL | Status: DC

## 2017-12-30 MED ORDER — PREDNISONE 10 MG PO TABS: 40.0000 mg | ORAL_TABLET | Freq: Every day | ORAL | Status: DC

## 2017-12-30 MED ORDER — ORAL CARE MOUTH RINSE: 15.0000 mL | Freq: Two times a day (BID) | OROMUCOSAL | Status: DC

## 2017-12-30 MED ORDER — CHLORHEXIDINE GLUCONATE 0.12 % MT SOLN
15.0000 mL | Freq: Two times a day (BID) | OROMUCOSAL | Status: DC
  Administered 2017-12-30 – 2018-01-01 (×3): 15 mL via OROMUCOSAL
  Filled 2017-12-30 (×3): qty 15

## 2017-12-30 NOTE — Progress Notes (Signed)
[employer] [facility] - [place] at [facility]   PATIENT NAME: Rebecca Bird    MR#:  [id number]  DATE OF BIRTH:  [birth year]  SUBJECTIVE:  CHIEF COMPLAINT:   Chief Complaint  Patient presents with  . Shortness of Breath   Came with SOB, morbidly obese- Off Bipap. Feeling okay and using BiPAP as needed otherwise she is using 6 L of oxygen which is her baseline  REVIEW OF SYSTEMS:   REVIEW OF SYSTEMS:   Constitutional: Negative for fever, chills, weight loss, malaise/fatigue and diaphoresis.  HENT: Negative for hearing loss, ear pain, nosebleeds, congestion, sore throat, neck pain, tinnitus and ear discharge.   Eyes: Negative for blurred vision, double vision, photophobia, pain, discharge and redness.  Respiratory: Negative for cough, hemoptysis, sputum production, shortness of breath, wheezing and stridor.   Cardiovascular: Negative for chest pain, palpitations, orthopnea, claudication, leg swelling and PND.  Gastrointestinal: Negative for heartburn, nausea, vomiting, abdominal pain, diarrhea, constipation, blood in stool and melena.  Genitourinary: Negative for dysuria, urgency, frequency, hematuria and flank pain.  Musculoskeletal: Negative for myalgias, back pain, joint pain and falls.  Skin: Negative for itching and rash.  Neurological: Negative for dizziness, tingling, tremors, sensory change, speech change, focal weakness, seizures, loss of consciousness, weakness and headaches.   DRUG ALLERGIES:   Allergies  Allergen Reactions  . Levaquin [Levofloxacin In D5w] Itching and Rash    VITALS:  Blood pressure 130/70, pulse 89, temperature 98.6 F (37 C), temperature source Oral, resp. rate (!) 32, height 5\' 5"  (1.651 m), weight (!) 204.7 kg, SpO2 93 %.  PHYSICAL EXAMINATION:  GENERAL:  57 y.o.-year-old morbidly obese patient lying in the bed with no acute distress.  EYES: Pupils equal, round, reactive to light and accommodation. No scleral icterus. Extraocular muscles  intact.  HEENT: Head atraumatic, normocephalic. Oropharynx and nasopharynx clear.  NECK:  Supple, no jugular venous distention. No thyroid enlargement, no tenderness.  LUNGS: Normal breath sounds bilaterally, no wheezing, rales,rhonchi or crepitation. No use of accessory muscles of respiration. On bipap. CARDIOVASCULAR: S1, S2 normal. No murmurs, rubs, or gallops.  ABDOMEN: Soft, nontender, nondistended. Bowel sounds present. No organomegaly or mass.  EXTREMITIES: No pedal edema, cyanosis, or clubbing.  NEUROLOGIC: drowsy, on bipap. PSYCHIATRIC: The patient is on bipap, drowsy.  SKIN: No obvious rash, lesion, or ulcer.    LABORATORY PANEL:   CBC Recent Labs  Lab 12/30/17 0500  WBC 4.2  HGB 11.8*  HCT 35.5  PLT 197   ------------------------------------------------------------------------------------------------------------------  Chemistries  Recent Labs  Lab 12/29/17 0324 12/30/17 0500  NA 144 144  K 4.3 4.4  CL 85* 89*  CO2 46* 49*  GLUCOSE 120* 115*  BUN 22* 30*  CREATININE 0.65 0.67  CALCIUM 9.1 8.8*  MG 2.0  --   AST 21  --   ALT 32  --   ALKPHOS 83  --   BILITOT 0.8  --    ------------------------------------------------------------------------------------------------------------------  Cardiac Enzymes Recent Labs  Lab 12/27/17 2150  TROPONINI <0.03   ------------------------------------------------------------------------------------------------------------------  RADIOLOGY:  Dg Chest Port 1 View  Result Date: 12/30/2017 CLINICAL DATA:  Acute respiratory failure EXAM: PORTABLE CHEST 1 VIEW COMPARISON:  12/29/2017 FINDINGS: Cardiac shadow is enlarged but stable. The lungs are well aerated bilaterally. Improved aeration is noted bilaterally when compare with the prior exam. No focal confluent infiltrate is seen. Some persistent vascular congestion is noted. IMPRESSION: Persistent vascular congestion although improved with decrease in pulmonary edema. No  new infiltrate is noted. Electronically Signed  By: Alcide Clever M.D.   On: 12/30/2017 08:18   Dg Chest Port 1 View  Result Date: 12/29/2017 CLINICAL DATA:  Shortness of breath. EXAM: PORTABLE CHEST 1 VIEW COMPARISON:  Radiograph of December 27, 2017. FINDINGS: Stable cardiomegaly with central pulmonary vascular congestion and probable bilateral pulmonary edema. No pneumothorax or definite pleural effusion is noted. Bony thorax is unremarkable. IMPRESSION: Stable cardiomegaly with central pulmonary vascular congestion and probable bilateral pulmonary edema. Electronically Signed   By: Lupita Raider, M.D.   On: 12/29/2017 09:14    ASSESSMENT AND PLAN:   Principal Problem:   Acute on chronic respiratory failure with hypoxia and hypercapnia (HCC) Active Problems:   OSA (obstructive sleep apnea)   COPD with acute exacerbation (HCC)   HTN (hypertension)   Hypothyroidism  *  Acute on chronic respiratory failure with hypoxia and hypercapnia (HCC) secondary to acute on chronic CHF, acute exacerbation of COPD with underlying obstructive sleep apnea -patient is currently off BiPAP,  Manage per ICU.  *  COPD with acute exacerbation (HCC) -IV Solu-Medrol is tapered to p.o. prednisone, duo nebs, continue home meds  *Acute on chronic diastolic CHF ejection fraction 55 to 60% July 2019 Lasix 40 mg IV twice daily changed to p.o. Lasix and Coreg. Monitor renal function Intake and output and daily weights  *  OSA (obstructive sleep apnea) -BiPAP as needed  *  HTN (hypertension) -continue home medications  *  Hypothyroidism -home dose thyroid replacement  Deconditioning with physical therapy  All the records are reviewed and case discussed with Care Management/Social Workerr. Management plans discussed with the patient, family and they are in agreement.  CODE STATUS: DNR  TOTAL TIME TAKING CARE OF THIS PATIENT: 35 minutes.   POSSIBLE D/C IN 1-2 DAYS, DEPENDING ON CLINICAL  CONDITION.   Ramonita Lab M.D on 12/30/2017   Between 7am to 6pm - Pager - [phone]  After 6pm go to [email or web address] - password [email or web address]  [facility]  Office  [phone]  CC: Primary care physician; Dinges, Roanna Banning, NP  Note: This dictation was prepared with Dragon dictation along with smaller phrase technology. Any transcriptional errors that result from this process are unintentional.

## 2017-12-30 NOTE — Progress Notes (Signed)
OT Cancellation Note  Patient Details Name: Rebecca Bird MRN: [id number] DOB: 12-May-1960   Cancelled Treatment:    Reason Eval/Treat Not Completed: Patient not medically ready. Noted with pt desat on [place] and now back on bipap. Will hold OT evaluation attempt this date and re-attempt at later date/time as pt is medically appropriate.   Richrd Prime, MPH, MS, OTR/L ascom [phone] 12/30/17, 5:15 PM

## 2017-12-30 NOTE — Progress Notes (Signed)
OT Cancellation Note  Patient Details Name: Rebecca Bird MRN: [id number] DOB: 1960-10-07   Cancelled Treatment:    Reason Eval/Treat Not Completed: Patient at procedure or test/ unavailable. Pt. With nursing during 2nd attempt. Will continue to monitor, and perform the initial OT visit at a later time/date as pt. Is available.  Olegario Messier, MS, OTR/L 12/30/2017, 11:22 AM

## 2017-12-30 NOTE — Progress Notes (Addendum)
Name: Rebecca Bird MRN: [id number] DOB: 09/06/1960    ADMISSION DATE:  12/27/2017  BRIEF PATIENT DESCRIPTION:  57 y.o. Female admitted with Acute Hypoxic Hypercapnic Respiratory Failure requiring BiPAP in setting of Acute on Chronic CHF and COPD exacerbation  SIGNIFICANT EVENTS/STUDIES:  12/28/2017>> Admission to [facility]  SUBJECTIVE:  No complaints at this time  VITAL SIGNS: Temp:  [98.2 F (36.8 C)-98.8 F (37.1 C)] 98.6 F (37 C) (09/27 0800) Pulse Rate:  [71-99] 93 (09/27 0900) Resp:  [14-30] 15 (09/27 0900) BP: (102-131)/(56-89) 131/89 (09/27 0900) SpO2:  [90 %-97 %] 94 % (09/27 0900) FiO2 (%):  [40 %] 40 % (09/27 0151)  PHYSICAL EXAMINATION: General: well developed, well nourished obese female, NAD resting in bed  Neuro: alert and oriented HEENT: large neck unable to assess for JVD  Cardiovascular: nsr, rrr, no R/G Lungs: diminished throughout, even, non labored  Abdomen: +BS x4, obese, soft, non tender, non distended  Musculoskeletal: normal tone, 1+ bilateral lower extremity edema Skin: intact no rashes or lesions   Recent Labs  Lab 12/28/17 2023 12/29/17 0324 12/30/17 0500  NA 145 144 144  K 4.1 4.3 4.4  CL 86* 85* 89*  CO2 >50* 46* 49*  BUN 23* 22* 30*  CREATININE 0.57 0.65 0.67  GLUCOSE 148* 120* 115*   Recent Labs  Lab 12/28/17 1526 12/29/17 0324 12/30/17 0500  HGB 13.1 11.4* 11.8*  HCT 39.8 35.4 35.5  WBC 4.8 5.5 4.2  PLT 190 202 197   Dg Chest Port 1 View  Result Date: 12/30/2017 CLINICAL DATA:  Acute respiratory failure EXAM: PORTABLE CHEST 1 VIEW COMPARISON:  12/29/2017 FINDINGS: Cardiac shadow is enlarged but stable. The lungs are well aerated bilaterally. Improved aeration is noted bilaterally when compare with the prior exam. No focal confluent infiltrate is seen. Some persistent vascular congestion is noted. IMPRESSION: Persistent vascular congestion although improved with decrease in pulmonary edema. No new infiltrate is noted.  Electronically Signed   By: Alcide Clever M.D.   On: 12/30/2017 08:18   Dg Chest Port 1 View  Result Date: 12/29/2017 CLINICAL DATA:  Shortness of breath. EXAM: PORTABLE CHEST 1 VIEW COMPARISON:  Radiograph of December 27, 2017. FINDINGS: Stable cardiomegaly with central pulmonary vascular congestion and probable bilateral pulmonary edema. No pneumothorax or definite pleural effusion is noted. Bony thorax is unremarkable. IMPRESSION: Stable cardiomegaly with central pulmonary vascular congestion and probable bilateral pulmonary edema. Electronically Signed   By: Lupita Raider, M.D.   On: 12/29/2017 09:14   [place] Ekg Site Rite  Result Date: 12/28/2017 If Site Rite image not attached, placement could not be confirmed due to current cardiac rhythm.   ASSESSMENT / PLAN:  A: Acute hypoxic hypercapnic respiratory failure in setting of acute on chronic CHF, AECOPD, and OHS/OSA P: Supplemental O2 to maintain O2 sats 88 to 92% Will need Bipap qhs and during naps   Scheduled and prn bronchodilator therapy  Will d/c iv solumedrol and start prednisone taper  Continue provera-stop day 01/01/18  A: Acute on chronic diastolic CHF (Echo 10/2017: EF 55-60%) P: Continuous telemetry monitoring  Continue outpatient lasix 40 mg bid and carvedilol Trend BMP, replace electrolytes, and monitor UOP   A: Hyperglycemia Hx: Hypothyroidism  P: CBG and SSI Follow ICU Hypo/hyperglycemia protocol Will check hemoglobin A1c Continue outpatient synthroid  VTE px: subq: lovenox  Continue heart healthy diet   -Pt stable will transfer pt to telemetry unit today 12/30/17   Sonda Rumble, AGNP  Pulmonary/Critical Care  Pager [phone] (please enter 7 digits) PCCM Consult Pager [phone] (please enter 7 digits)

## 2017-12-30 NOTE — Progress Notes (Signed)
OT Cancellation Note  Patient Details Name: KAYSI OURADA MRN: [id number] DOB: Aug 17, 1960   Cancelled Treatment:    Reason Eval/Treat Not Completed: Patient at procedure or test/ unavailable(Pt. currently receiving bath, and a.m. nursing care. Will initiate initial visit at a later time/date as pt. is available.)  Olegario Messier, MS, OTR/L 12/30/2017, 9:50 AM

## 2017-12-30 NOTE — Progress Notes (Signed)
PT Cancellation Note  Patient Details Name: Rebecca Bird MRN: [id number] DOB: 02/05/1961   Cancelled Treatment:    Reason Eval/Treat Not Completed: Other (comment). Noted with pt desat on [place] and now back on bipap. Will hold treatment attempt at this time.   Wynetta Seith 12/30/2017, 3:48 PM  Elizabeth Palau, PT, DPT [phone]

## 2017-12-31 LAB — GLUCOSE, CAPILLARY
GLUCOSE-CAPILLARY: 115 mg/dL — AB (ref 70–99)
GLUCOSE-CAPILLARY: 145 mg/dL — AB (ref 70–99)
GLUCOSE-CAPILLARY: 156 mg/dL — AB (ref 70–99)
GLUCOSE-CAPILLARY: 159 mg/dL — AB (ref 70–99)
GLUCOSE-CAPILLARY: 84 mg/dL (ref 70–99)
Glucose-Capillary: 199 mg/dL — ABNORMAL HIGH (ref 70–99)

## 2017-12-31 LAB — BASIC METABOLIC PANEL
ANION GAP: 6 (ref 5–15)
BUN: 32 mg/dL — ABNORMAL HIGH (ref 6–20)
CHLORIDE: 88 mmol/L — AB (ref 98–111)
CO2: 49 mmol/L — ABNORMAL HIGH (ref 22–32)
Calcium: 8.8 mg/dL — ABNORMAL LOW (ref 8.9–10.3)
Creatinine, Ser: 0.64 mg/dL (ref 0.44–1.00)
GFR calc non Af Amer: >60 mL/min (ref >60)
Glucose, Bld: 148 mg/dL — ABNORMAL HIGH (ref 70–99)
Potassium: 3.6 mmol/L (ref 3.5–5.1)
Sodium: 143 mmol/L (ref 135–145)

## 2017-12-31 LAB — PHOSPHORUS: PHOSPHORUS: 3.8 mg/dL (ref 2.5–4.6)

## 2017-12-31 LAB — MAGNESIUM: Magnesium: 2.1 mg/dL (ref 1.7–2.4)

## 2017-12-31 MED ORDER — FUROSEMIDE 20 MG PO TABS
40.0000 mg | ORAL_TABLET | Freq: Every day | ORAL | Status: DC
  Administered 2018-01-01: 40 mg via ORAL
  Filled 2017-12-31: qty 2

## 2017-12-31 MED ORDER — IPRATROPIUM-ALBUTEROL 0.5-2.5 (3) MG/3ML IN SOLN
3.0000 mL | Freq: Three times a day (TID) | RESPIRATORY_TRACT | Status: DC
  Administered 2017-12-31 (×2): 3 mL via RESPIRATORY_TRACT
  Filled 2017-12-31 (×2): qty 3

## 2017-12-31 MED ORDER — ALBUTEROL SULFATE (2.5 MG/3ML) 0.083% IN NEBU: 2.5000 mg | INHALATION_SOLUTION | RESPIRATORY_TRACT | Status: DC | PRN

## 2017-12-31 MED ORDER — INSULIN ASPART 100 UNIT/ML ~~LOC~~ SOLN
0.0000 [IU] | Freq: Three times a day (TID) | SUBCUTANEOUS | Status: DC
  Administered 2017-12-31 – 2018-01-01 (×3): 3 [IU] via SUBCUTANEOUS
  Filled 2017-12-31 (×3): qty 1

## 2017-12-31 MED ORDER — BUDESONIDE 0.25 MG/2ML IN SUSP
0.2500 mg | Freq: Two times a day (BID) | RESPIRATORY_TRACT | Status: DC
  Administered 2017-12-31 – 2018-01-01 (×2): 0.25 mg via RESPIRATORY_TRACT
  Filled 2017-12-31 (×2): qty 2

## 2017-12-31 NOTE — Progress Notes (Signed)
Pt has remained alert and oriented with no c/o pain. Pt has transitioned to [facility] from her baseline 6LNC-SpO2 92%, HR 88, NDN. Pt transitions to BIPAPfor naps an HS-prior baseline CPAP, but will be d/c with BIPAP mask. Pt has remained in NSR on cardiac monitor, BP/HR WNL. Pt has tolerated lots of visitors today. Orders to transfer are still in place.

## 2017-12-31 NOTE — Progress Notes (Signed)
OT Cancellation Note  Patient Details Name: PEYTAN ANDRINGA MRN: [id number] DOB: 24-Sep-1960   Cancelled Treatment:     Patient currently sleeping and on Bipap.  Continue attempts to see patient for OT evaluation.  Amy T Lovett, OTR/L, CLT   Lovett,Amy 12/31/2017, 2:11 PM

## 2017-12-31 NOTE — Progress Notes (Signed)
[facility] [place] at [facility]   PATIENT NAME: Rebecca Bird    MR#:  [id number]  DATE OF BIRTH:  November 05, 1960  SUBJECTIVE:  CHIEF COMPLAINT:   Chief Complaint  Patient presents with  . Shortness of Breath   Came with SOB, morbidly obese- Off Bipap. Feeling okay and using BiPAP as needed otherwise she is using 5 L of oxygen which is her baseline.  REVIEW OF SYSTEMS:   REVIEW OF SYSTEMS:   Constitutional: Negative for fever, chills, weight loss, malaise/fatigue and diaphoresis.  HENT: Negative for hearing loss, ear pain, nosebleeds, congestion, sore throat, neck pain, tinnitus and ear discharge.   Eyes: Negative for blurred vision, double vision, photophobia, pain, discharge and redness.  Respiratory: Negative for cough, hemoptysis, sputum production, shortness of breath, wheezing and stridor.   Cardiovascular: Negative for chest pain, palpitations, orthopnea, claudication, leg swelling and PND.  Gastrointestinal: Negative for heartburn, nausea, vomiting, abdominal pain, diarrhea, constipation, blood in stool and melena.  Genitourinary: Negative for dysuria, urgency, frequency, hematuria and flank pain.  Musculoskeletal: Negative for myalgias, back pain, joint pain and falls.  Skin: Negative for itching and rash.  Neurological: Negative for dizziness, tingling, tremors, sensory change, speech change, focal weakness, seizures, loss of consciousness, weakness and headaches.   DRUG ALLERGIES:   Allergies  Allergen Reactions  . Levaquin [Levofloxacin In D5w] Itching and Rash    VITALS:  Blood pressure 113/79, pulse 80, temperature 98.2 F (36.8 C), temperature source Oral, resp. rate 19, height 5\' 5"  (1.651 m), weight (!) 204.7 kg, SpO2 96 %.  PHYSICAL EXAMINATION:  GENERAL:  57 y.o.-year-old morbidly obese patient lying in the bed with no acute distress.  EYES: Pupils equal, round, reactive to light and accommodation. No scleral icterus. Extraocular muscles  intact.  HEENT: Head atraumatic, normocephalic. Oropharynx and nasopharynx clear.  NECK:  Supple, no jugular venous distention. No thyroid enlargement, no tenderness.  LUNGS: Normal breath sounds bilaterally, no wheezing, rales,rhonchi or crepitation. No use of accessory muscles of respiration. On 5 L nasal cannula currently. CARDIOVASCULAR: S1, S2 normal. No murmurs, rubs, or gallops.  ABDOMEN: Soft, nontender, nondistended. Bowel sounds present. No organomegaly or mass.  EXTREMITIES: No pedal edema, cyanosis, or clubbing.  NEUROLOGIC: Alert, follows command, moves all 4 limbs, have generalized weakness, sensation intact. PSYCHIATRIC: The patient is on nasal cannula, alert and oriented x3.  SKIN: No obvious rash, lesion, or ulcer.    LABORATORY PANEL:   CBC Recent Labs  Lab 12/30/17 0500  WBC 4.2  HGB 11.8*  HCT 35.5  PLT 197   ------------------------------------------------------------------------------------------------------------------  Chemistries  Recent Labs  Lab 12/29/17 0324  12/31/17 0428  NA 144   < > 143  K 4.3   < > 3.6  CL 85*   < > 88*  CO2 46*   < > 49*  GLUCOSE 120*   < > 148*  BUN 22*   < > 32*  CREATININE 0.65   < > 0.64  CALCIUM 9.1   < > 8.8*  MG 2.0  --  2.1  AST 21  --   --   ALT 32  --   --   ALKPHOS 83  --   --   BILITOT 0.8  --   --    < > = values in this interval not displayed.   ------------------------------------------------------------------------------------------------------------------  Cardiac Enzymes Recent Labs  Lab 12/27/17 2150  TROPONINI <0.03   ------------------------------------------------------------------------------------------------------------------  RADIOLOGY:  Dg Chest [facility]  Result Date: 12/30/2017 CLINICAL DATA:  Acute respiratory failure EXAM: PORTABLE CHEST 1 VIEW COMPARISON:  12/29/2017 FINDINGS: Cardiac shadow is enlarged but stable. The lungs are well aerated bilaterally. Improved aeration is  noted bilaterally when compare with the prior exam. No focal confluent infiltrate is seen. Some persistent vascular congestion is noted. IMPRESSION: Persistent vascular congestion although improved with decrease in pulmonary edema. No new infiltrate is noted. Electronically Signed   By: Alcide Clever M.D.   On: 12/30/2017 08:18    ASSESSMENT AND PLAN:   Principal Problem:   Acute on chronic respiratory failure with hypoxia and hypercapnia (HCC) Active Problems:   OSA (obstructive sleep apnea)   COPD with acute exacerbation (HCC)   HTN (hypertension)   Hypothyroidism  *  Acute on chronic respiratory failure with hypoxia and hypercapnia (HCC) secondary to acute on chronic CHF, acute exacerbation of COPD with underlying obstructive sleep apnea -patient is currently off BiPAP, needs at nighttime but that is her baseline at nursing home also. Elevated floor bed and transfer out of ICU.  *  COPD with acute exacerbation (HCC) -IV Solu-Medrol is tapered to p.o. prednisone, duo nebs, continue home meds.  *Acute on chronic diastolic CHF ejection fraction 55 to 60% July 2019 Lasix 40 mg IV twice daily changed to p.o. Lasix and Coreg. Monitor renal function Intake and output and daily weights  *  OSA (obstructive sleep apnea) -BiPAP at night and during nap.  *  HTN (hypertension) -continue home medications  *  Hypothyroidism -home dose thyroid replacement  Deconditioning with physical therapy- came from SNF.  All the records are reviewed and case discussed with Care Management/Social Workerr. Management plans discussed with the patient, family and they are in agreement.  CODE STATUS: DNR  TOTAL TIME TAKING CARE OF THIS PATIENT: 35 minutes.   POSSIBLE D/C IN 1-2 DAYS, DEPENDING ON CLINICAL CONDITION.   Altamese Dilling M.D on 12/31/2017   Between 7am to 6pm - Pager - [phone]  After 6pm go to [email or web address] - password [email or web address]  [facility]  Office   [phone]  CC: Primary care physician; Dinges, Roanna Banning, NP  Note: This dictation was prepared with Dragon dictation along with smaller phrase technology. Any transcriptional errors that result from this process are unintentional.

## 2017-12-31 NOTE — Progress Notes (Signed)
No distress No new complaints Somewhat somnolent  Vitals:   12/31/17 1100 12/31/17 1200 12/31/17 1300 12/31/17 1326  BP: 129/73 128/74 113/79   Pulse: 86 75 80 82  Resp: (!) 22 (!) 22 (!) 22 (!) 21  Temp:      TempSrc:      SpO2: 91% 93% (!) 85% 94%  Weight:      Height:       Lethargic Obese Chest clear Regular, no M Abdomen soft Extremities warm No focal neurologic deficits  BMP Latest Ref Rng & Units 12/31/2017 12/30/2017 12/29/2017  Glucose 70 - 99 mg/dL 960(A) 540(J) 811(B)  BUN 6 - 20 mg/dL 14(N) 82(N) 56(O)  Creatinine 0.44 - 1.00 mg/dL 1.30 8.65 7.84  Sodium 135 - 145 mmol/L 143 144 144  Potassium 3.5 - 5.1 mmol/L 3.6 4.4 4.3  Chloride 98 - 111 mmol/L 88(L) 89(L) 85(L)  CO2 22 - 32 mmol/L 49(H) 49(H) 46(H)  Calcium 8.9 - 10.3 mg/dL 6.9(G) 2.9(B) 9.1   CBC Latest Ref Rng & Units 12/30/2017 12/29/2017 12/28/2017  WBC 3.6 - 11.0 K/uL 4.2 5.5 4.8  Hemoglobin 12.0 - 16.0 g/dL 11.8(L) 11.4(L) 13.1  Hematocrit 35.0 - 47.0 % 35.5 35.4 39.8  Platelets 150 - 440 K/uL 197 202 190    CXR: No new film  IMPRESSION: Acute/chronic hypercarbic respiratory failure - I suspect that this is due more to OSA/OHS (pickwickian syndrome) and COPD.  These are the people that we have to be careful with that we do not administer excess oxygen as it can further suppress her ventilatory drive.  There is no evidence of acute bronchospasm presently and therefore no indication for continued systemic steroids.  Plan/Rec: Continue supplemental oxygen.  Titrate carefully to keep SPO2 88-94%.  No higher, no lower. Continue BiPAP with sleep including if she is napping during day Discontinue systemic steroids Continue nebulized bronchodilators Transfer to MedSurg floor with cardiac monitoring  After transfer, PCCM will sign off. Please call if we can be of further assistance    Billy Fischer, MD PCCM service Mobile [phone] Pager [phone] 12/31/2017 1:42 PM

## 2017-12-31 NOTE — Progress Notes (Signed)
Patient has been on the cpap since 2200. Asked for it to be taken off around 0630 this AM. Uses 6 L on day shift when she is awake and ot taking naps. NSR on the monitor. CBG coverage x 2 since patient is on steroids. Continue to monitor.

## 2018-01-01 LAB — GLUCOSE, CAPILLARY
Glucose-Capillary: 90 mg/dL (ref 70–99)
Glucose-Capillary: 151 mg/dL — ABNORMAL HIGH (ref 70–99)

## 2018-01-01 MED ORDER — BUDESONIDE 0.25 MG/2ML IN SUSP: 0.2500 mg | Freq: Two times a day (BID) | RESPIRATORY_TRACT | 12 refills | Status: AC

## 2018-01-01 MED ORDER — IPRATROPIUM-ALBUTEROL 0.5-2.5 (3) MG/3ML IN SOLN
3.0000 mL | Freq: Three times a day (TID) | RESPIRATORY_TRACT | Status: DC
  Administered 2018-01-01 (×2): 3 mL via RESPIRATORY_TRACT
  Filled 2018-01-01 (×2): qty 3

## 2018-01-01 NOTE — Progress Notes (Signed)
Patient is floor care status.  Not officially seen by PCCM service today.  Please call if we can be of further assistance.  She appears to be at her baseline and ready for transfer back to SNF.  Billy Fischer, MD PCCM service Mobile [phone] Pager [phone] 01/01/2018 2:45 PM

## 2018-01-01 NOTE — Discharge Summary (Signed)
[facility] [place] at [facility]   PATIENT NAME: Rebecca Bird    MR#:  [id number]  DATE OF BIRTH:  April 13, 1960  DATE OF ADMISSION:  12/27/2017 ADMITTING PHYSICIAN: Ramonita Lab, MD  DATE OF DISCHARGE: 01/01/2018   PRIMARY CARE PHYSICIAN: Dinges, Roanna Banning, NP    ADMISSION DIAGNOSIS:  Hypercapnia [R06.89] Hypoxia [R09.02]  DISCHARGE DIAGNOSIS:  Principal Problem:   Acute on chronic respiratory failure with hypoxia and hypercapnia (HCC) Active Problems:   OSA (obstructive sleep apnea)   COPD with acute exacerbation (HCC)   HTN (hypertension)   Hypothyroidism   SECONDARY DIAGNOSIS:   Past Medical History:  Diagnosis Date  . Anemia   . CHF (congestive heart failure) (HCC)   . COPD (chronic obstructive pulmonary disease) (HCC)   . Depression   . Hypertension   . Sleep apnea   . Thyroid disease     HOSPITAL COURSE:    * Acute on chronic respiratory failure with hypoxia and hypercapnia (HCC) secondary to acute on chronic CHF, acute exacerbation of COPD with underlying obstructive sleep apnea -patient is currently off BiPAP, needs at nighttime but that is her baseline at nursing home also. - Stable last 2-3 days . On 3-4 ltr nasal canula oxygen.  *COPD with acute exacerbation (HCC) -IV Solu-Medrol is tapered to p.o. prednisone, duo nebs, continue home meds.  *Acute on chronic diastolic CHF ejection fraction 55 to 60% July 2019 Lasix 40 mg IV twice daily changed to p.o. Lasix and Coreg. Monitor renal function Intake and output and daily weights  *OSA (obstructive sleep apnea) -BiPAP at night and during nap.  *HTN (hypertension) -continue home medications  *Hypothyroidism -home dose thyroid replacement  Deconditioning with physical therapy- came from SNF.  DISCHARGE CONDITIONS:   Stable.  CONSULTS OBTAINED:    DRUG ALLERGIES:   Allergies  Allergen Reactions  . Levaquin [Levofloxacin In D5w] Itching and Rash     DISCHARGE MEDICATIONS:   Allergies as of 01/01/2018      Reactions   Levaquin [levofloxacin In D5w] Itching, Rash      Medication List    STOP taking these medications   ipratropium 0.03 % nasal spray Commonly known as:  ATROVENT     TAKE these medications   budesonide 0.25 MG/2ML nebulizer solution Commonly known as:  PULMICORT Take 2 mLs (0.25 mg total) by nebulization 2 (two) times daily.   carvedilol 3.125 MG tablet Commonly known as:  COREG Take 3.125 mg by mouth 2 (two) times daily with a meal.   citalopram 40 MG tablet Commonly known as:  CELEXA Take 40 mg by mouth daily.   ferrous sulfate 325 (65 FE) MG tablet Take 325 mg by mouth daily with breakfast.   furosemide 40 MG tablet Commonly known as:  LASIX Take 1 tablet (40 mg total) by mouth 2 (two) times daily.   ipratropium-albuterol 0.5-2.5 (3) MG/3ML Soln Commonly known as:  DUONEB Take 3 mLs by nebulization every 6 (six) hours as needed (shortness of breath). What changed:  reasons to take this   levothyroxine 100 MCG tablet Commonly known as:  SYNTHROID, LEVOTHROID Take 1 tablet (100 mcg total) by mouth daily before breakfast.   Turmeric 450 MG Caps Take 450 mg by mouth daily.        DISCHARGE INSTRUCTIONS:    Follow with PMD in 1 week.  If you experience worsening of your admission symptoms, develop shortness of breath, life threatening emergency, suicidal or homicidal thoughts you must seek  medical attention immediately by calling 911 or calling your MD immediately  if symptoms less severe.  You Must read complete instructions/literature along with all the possible adverse reactions/side effects for all the Medicines you take and that have been prescribed to you. Take any new Medicines after you have completely understood and accept all the possible adverse reactions/side effects.   Please note  You were cared for by a hospitalist during your hospital stay. If you have any questions  about your discharge medications or the care you received while you were in the hospital after you are discharged, you can call the unit and asked to speak with the hospitalist on call if the hospitalist that took care of you is not available. Once you are discharged, your primary care physician will handle any further medical issues. Please note that NO REFILLS for any discharge medications will be authorized once you are discharged, as it is imperative that you return to your primary care physician (or establish a relationship with a primary care physician if you do not have one) for your aftercare needs so that they can reassess your need for medications and monitor your lab values.    Today   CHIEF COMPLAINT:   Chief Complaint  Patient presents with  . Shortness of Breath    HISTORY OF PRESENT ILLNESS:  Rebecca Bird  is a 57 y.o. female presents with chief complaint as above.  Patient is altered, nonverbal at this time and so unable to contribute to her history of present illness.  She has been admitted to this hospital frequently with respiratory condition.  She comes in tonight with a CO2 level of 120 on her blood gas.  She is on BiPAP and hospitalist were called for admission    VITAL SIGNS:  Blood pressure 120/71, pulse 83, temperature 98.6 F (37 C), temperature source Oral, resp. rate 17, height 5\' 5"  (1.651 m), weight (!) 204.7 kg, SpO2 93 %.  I/O:    Intake/Output Summary (Last 24 hours) at 01/01/2018 1245 Last data filed at 01/01/2018 1119 Gross per 24 hour  Intake 1465 ml  Output 3125 ml  Net -1660 ml    PHYSICAL EXAMINATION:   GENERAL:  57 y.o.-year-old morbidly obese patient lying in the bed with no acute distress.  EYES: Pupils equal, round, reactive to light and accommodation. No scleral icterus. Extraocular muscles intact.  HEENT: Head atraumatic, normocephalic. Oropharynx and nasopharynx clear.  NECK:  Supple, no jugular venous distention. No thyroid  enlargement, no tenderness.  LUNGS: Normal breath sounds bilaterally, no wheezing, rales,rhonchi or crepitation. No use of accessory muscles of respiration. On 5 L nasal cannula currently. CARDIOVASCULAR: S1, S2 normal. No murmurs, rubs, or gallops.  ABDOMEN: Soft, nontender, nondistended. Bowel sounds present. No organomegaly or mass.  EXTREMITIES: No pedal edema, cyanosis, or clubbing.  NEUROLOGIC: Alert, follows command, moves all 4 limbs, have generalized weakness, sensation intact. PSYCHIATRIC: The patient is on nasal cannula, alert and oriented x3.  SKIN: No obvious rash, lesion, or ulcer.   DATA REVIEW:   CBC Recent Labs  Lab 12/30/17 0500  WBC 4.2  HGB 11.8*  HCT 35.5  PLT 197    Chemistries  Recent Labs  Lab 12/29/17 0324  12/31/17 0428  NA 144   < > 143  K 4.3   < > 3.6  CL 85*   < > 88*  CO2 46*   < > 49*  GLUCOSE 120*   < > 148*  BUN 22*   < >  32*  CREATININE 0.65   < > 0.64  CALCIUM 9.1   < > 8.8*  MG 2.0  --  2.1  AST 21  --   --   ALT 32  --   --   ALKPHOS 83  --   --   BILITOT 0.8  --   --    < > = values in this interval not displayed.    Cardiac Enzymes Recent Labs  Lab 12/27/17 2150  TROPONINI <0.03    Microbiology Results  Results for orders placed or performed during the hospital encounter of 12/27/17  MRSA PCR Screening     Status: None   Collection Time: 12/28/17  1:48 AM  Result Value Ref Range Status   MRSA by PCR NEGATIVE NEGATIVE Final    Comment:        The GeneXpert MRSA Assay (FDA approved for NASAL specimens only), is one component of a comprehensive MRSA colonization surveillance program. It is not intended to diagnose MRSA infection nor to guide or monitor treatment for MRSA infections. Performed at [facility], [address] [place], [place] [address]     RADIOLOGY:  No results found.  EKG:   Orders placed or performed during the hospital encounter of 12/27/17  . EKG 12-Lead  . EKG 12-Lead       Management plans discussed with the patient, family and they are in agreement.  CODE STATUS:     Code Status Orders  (From admission, onward)         Start     Ordered   12/28/17 0126  Do not attempt resuscitation (DNR)  Continuous    Question Answer Comment  In the event of cardiac or respiratory ARREST Do not call a "code blue"   In the event of cardiac or respiratory ARREST Do not perform Intubation, CPR, defibrillation or ACLS   In the event of cardiac or respiratory ARREST Use medication by any route, position, wound care, and other measures to relive pain and suffering. May use oxygen, suction and manual treatment of airway obstruction as needed for comfort.   Comments Patient wishes for one cycle of CPR/ACLS. If she is not revived, do not attempt further CPR/ACLS.      12/28/17 0125        Code Status History    Date Active Date Inactive Code Status Order ID Comments User Context   11/21/2017 1408 11/25/2017 2055 Partial Code [id number]  Alita Chyle, NP Inpatient   11/16/2017 0328 11/21/2017 1408 Full Code [id number]  Arnaldo Natal, MD Inpatient   11/01/2017 0850 11/05/2017 1903 Full Code [id number]  Arnaldo Natal, MD Inpatient    Advance Directive Documentation     Most Recent Value  Type of Advance Directive  Out of facility DNR (pink MOST or yellow form)  Pre-existing out of facility DNR order (yellow form or pink MOST form)  Physician notified to receive inpatient order, Yellow form placed in chart (order not valid for inpatient use)  "MOST" Form in Place?  -      TOTAL TIME TAKING CARE OF THIS PATIENT: 34 minutes.    Altamese Dilling M.D on 01/01/2018 at 12:45 PM  Between 7am to 6pm - Pager - [phone]  After 6pm go to [email or web address] - password [email or web address]  [facility] [place] [facility]  Office  [phone]  CC: Primary care physician; Dinges, Roanna Banning, NP   Note: This dictation was prepared with Dragon dictation along with smaller  Company secretary. Any transcriptional errors that result from this process are unintentional.

## 2018-01-01 NOTE — [place] FL2 (Signed)
MEDICAID FL2 LEVEL OF CARE SCREENING TOOL     IDENTIFICATION  Patient Name: Rebecca Bird Birthdate: 14-May-1960 Sex: female Admission Date (Current Location): 12/27/2017  [place] and [place] Number:  Chiropodist and Address:  [facility], [address] [place], [place] [address]      Provider Number: [id number]  Attending Physician Name and Address:  Altamese Dilling, *  Relative Name and Phone Number:  Dorothey Baseman (Daughter) [phone]    Current Level of Care: Hospital Recommended Level of Care: Skilled Nursing Facility Prior Approval Number:    Date Approved/Denied:   PASRR Number: [id number]  Discharge Plan: SNF    Current Diagnoses: Patient Active Problem List   Diagnosis Date Noted  . COPD with acute exacerbation (HCC) 12/27/2017  . HTN (hypertension) 12/27/2017  . Hypothyroidism 12/27/2017  . OSA (obstructive sleep apnea)   . Obesity hypoventilation syndrome (HCC)   . Acute respiratory failure with hypoxia and hypercapnia (HCC)   . Palliative care by specialist   . Goals of care, counseling/discussion   . Acute on chronic respiratory failure with hypoxia and hypercapnia (HCC) 11/16/2017  . Acute on chronic respiratory failure with hypoxemia (HCC) 11/01/2017    Orientation RESPIRATION BLADDER Height & Weight     Self, Time, Situation, Place  O2(4L o2) Incontinent Weight: (!) 451 lb 4.5 oz (204.7 kg) Height:  5\' 5"  (165.1 cm)  BEHAVIORAL SYMPTOMS/MOOD NEUROLOGICAL BOWEL NUTRITION STATUS      Continent Diet(Heart healthy)  AMBULATORY STATUS COMMUNICATION OF NEEDS Skin   Extensive Assist Verbally Normal                       Personal Care Assistance Level of Assistance  Bathing, Feeding, Dressing Bathing Assistance: Maximum assistance Feeding assistance: Independent Dressing Assistance: Maximum assistance     Functional Limitations Info  Sight, Hearing, Speech Sight Info:  Adequate Hearing Info: Adequate Speech Info: Adequate    SPECIAL CARE FACTORS FREQUENCY  PT (By licensed PT), OT (By licensed OT)     PT Frequency: Up to 5X per week OT Frequency: Up to 3X per week            Contractures Contractures Info: Not present    Additional Factors Info  Code Status, Allergies Code Status Info: DNR Allergies Info:  Levaquin Levofloxacin In D5w           Current Medications (01/01/2018):  This is the current hospital active medication list Current Facility-Administered Medications  Medication Dose Route Frequency Provider Last Rate Last Dose  . acetaminophen (TYLENOL) tablet 650 mg  650 mg Oral Q6H PRN Oralia Manis, MD   650 mg at 01/01/18 1116  . albuterol (PROVENTIL) (2.5 MG/3ML) 0.083% nebulizer solution 2.5 mg  2.5 mg Nebulization Q4H PRN Merwyn Katos, MD      . budesonide (PULMICORT) nebulizer solution 0.25 mg  0.25 mg Nebulization BID Merwyn Katos, MD   0.25 mg at 01/01/18 0820  . carvedilol (COREG) tablet 3.125 mg  3.125 mg Oral BID WC Oralia Manis, MD   3.125 mg at 01/01/18 0753  . chlorhexidine (PERIDEX) 0.12 % solution 15 mL  15 mL Mouth Rinse BID Merwyn Katos, MD   15 mL at 01/01/18 1118  . citalopram (CELEXA) tablet 40 mg  40 mg Oral Daily Oralia Manis, MD   40 mg at 01/01/18 1100  . enoxaparin (LOVENOX) injection 40 mg  40 mg Subcutaneous BID Oralia Manis, MD  40 mg at 01/01/18 1116  . furosemide (LASIX) tablet 40 mg  40 mg Oral Daily Merwyn Katos, MD   40 mg at 01/01/18 1100  . insulin aspart (novoLOG) injection 0-15 Units  0-15 Units Subcutaneous TID WC Merwyn Katos, MD   3 Units at 12/31/17 1707  . ipratropium-albuterol (DUONEB) 0.5-2.5 (3) MG/3ML nebulizer solution 3 mL  3 mL Nebulization Q8H Altamese Dilling, MD   3 mL at 01/01/18 0820  . levothyroxine (SYNTHROID, LEVOTHROID) tablet 100 mcg  100 mcg Oral QAC breakfast Oralia Manis, MD   100 mcg at 01/01/18 0752  . MEDLINE mouth rinse  15 mL Mouth Rinse  q12n4p Merwyn Katos, MD      . ondansetron [facility]) tablet 4 mg  4 mg Oral Q6H PRN Oralia Manis, MD       Or  . ondansetron [facility]) injection 4 mg  4 mg Intravenous Q6H PRN Oralia Manis, MD      . phenol (CHLORASEPTIC) mouth spray 1 spray  1 spray Mouth/Throat PRN Ramonita Lab, MD   1 spray at 12/29/17 1223  . sodium chloride flush (NS) 0.9 % injection 10-40 mL  10-40 mL Intracatheter Q12H Altamese Dilling, MD   20 mL at 01/01/18 1119  . sodium chloride flush (NS) 0.9 % injection 10-40 mL  10-40 mL Intracatheter PRN Altamese Dilling, MD         Discharge Medications: Please see discharge summary for a list of discharge medications.  Relevant Imaging Results:  Relevant Lab Results:   Additional Information SSN: [id number]  Judi Cong, LCSW

## 2018-01-01 NOTE — Clinical Social Work Note (Signed)
Clinical Social Work Assessment  Patient Details  Name: Rebecca Bird MRN: [id number] Date of Birth: Apr 12, 1960  Date of referral:  01/01/18               Reason for consult:  Facility Placement                Permission sought to share information with:  Facility Industrial/product designer granted to share information::  Yes, Verbal Permission Granted  Name::        Agency::  [facility]  Relationship::     Contact Information:     Housing/Transportation Living arrangements for the past 2 months:  Skilled Nursing Facility Source of Information:  Patient, Medical Team, Facility Patient Interpreter Needed:  None Criminal Activity/Legal Involvement Pertinent to Current Situation/Hospitalization:  No - Comment as needed 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  Yes Need for family participation in patient care:  No (Coment)  Care giving concerns:  Patient admitted from [facility]   Social Worker assessment / plan:  The patient has admitted from [facility] where she is a short term rehab resident transitioning to long term care. The patient will discharge today to the facility via EMS. Tresa Endo, admissions coordinator, is aware and in agreement. The CSW will deliver the discharge packet and sign off. Please consult should additional needs arise.  Employment status:  Disabled (Comment on whether or not currently receiving Disability) Insurance information:  Managed Medicare PT Recommendations:  Not assessed at this time Information / Referral to community resources:     Patient/Family's Response to care:  The patient was gracious.  Patient/Family's Understanding of and Emotional Response to Diagnosis, Current Treatment, and Prognosis:  The patient is aware of the discharge and in agreement.  Emotional  Assessment Appearance:  Appears stated age Attitude/Demeanor/Rapport:  Engaged, Gracious Affect (typically observed):  Pleasant Orientation:  Oriented to Self, Oriented to Place, Oriented to  Time, Oriented to Situation Alcohol / Substance use:  Never Used Psych involvement (Current and /or in the community):  No (Comment)  Discharge Needs  Concerns to be addressed:  Care Coordination, Discharge Planning Concerns Readmission within the last 30 days:  No Current discharge risk:  Chronically ill, Physical Impairment Barriers to Discharge:  No Barriers Identified   Judi Cong, LCSW 01/01/2018, 12:57 PM

## 2018-01-01 NOTE — Discharge Instructions (Signed)
Cont using bipap at night and during Nap in day time.

## 2018-01-01 NOTE — Progress Notes (Signed)
Pt has remained alert and oriented with c/o chronic right shoulder pain with movement/repositioning->no pain med necessary. Pt has remained in NSR on cardiac monitor, BP/HR WNL. Pt has remained on 4LNC while awake with transition to bipap while asleep->pt has been very compliant. Report called to Katheran Awe took report and EMS picked her up at approximate 1715. Report given that evening carvedilol was not given.

## 2018-01-03 LAB — BLOOD GAS, ARTERIAL
ALLENS TEST (PASS/FAIL): POSITIVE — AB
Delivery systems: POSITIVE
EXPIRATORY PAP: 6
FIO2: 0.6
FIO2: 0.32
Inspiratory PAP: 14
Mechanical Rate: 8
PH ART: 7.27 — AB (ref 7.350–7.450)
PO2 ART: 116 mmHg — AB (ref 83.0–108.0)
PO2 ART: 54 mmHg — AB (ref 83.0–108.0)
Patient temperature: 37
Patient temperature: 37
pCO2 arterial: 120 mmHg (ref 32.0–48.0)
pCO2 arterial: 120 mmHg (ref 32.0–48.0)
pH, Arterial: 7.31 — ABNORMAL LOW (ref 7.350–7.450)

## 2018-01-20 ENCOUNTER — Inpatient Hospital Stay
Admission: EM | Admit: 2018-01-20 | Discharge: 2018-01-22 | DRG: 189 | Disposition: A | Discharge status: Skilled Nursing Facility | Payer: Medicare Other | Attending: Specialist | Admitting: Specialist

## 2018-01-20 ENCOUNTER — Emergency Department: Payer: Medicare Other

## 2018-01-20 ENCOUNTER — Other Ambulatory Visit: Payer: Self-pay

## 2018-01-20 ENCOUNTER — Encounter: Payer: Self-pay | Admitting: *Deleted

## 2018-01-20 DIAGNOSIS — J9622 Acute and chronic respiratory failure with hypercapnia: Secondary | ICD-10-CM | POA: Diagnosis present

## 2018-01-20 DIAGNOSIS — Z7951 Long term (current) use of inhaled steroids: Secondary | ICD-10-CM

## 2018-01-20 DIAGNOSIS — D649 Anemia, unspecified: Secondary | ICD-10-CM | POA: Diagnosis present

## 2018-01-20 DIAGNOSIS — E039 Hypothyroidism, unspecified: Secondary | ICD-10-CM | POA: Diagnosis present

## 2018-01-20 DIAGNOSIS — J189 Pneumonia, unspecified organism: Secondary | ICD-10-CM | POA: Diagnosis present

## 2018-01-20 DIAGNOSIS — J9602 Acute respiratory failure with hypercapnia: Secondary | ICD-10-CM

## 2018-01-20 DIAGNOSIS — E876 Hypokalemia: Secondary | ICD-10-CM | POA: Diagnosis not present

## 2018-01-20 DIAGNOSIS — E662 Morbid (severe) obesity with alveolar hypoventilation: Secondary | ICD-10-CM | POA: Diagnosis present

## 2018-01-20 DIAGNOSIS — J9621 Acute and chronic respiratory failure with hypoxia: Principal | ICD-10-CM | POA: Diagnosis present

## 2018-01-20 DIAGNOSIS — Z79890 Hormone replacement therapy (postmenopausal): Secondary | ICD-10-CM | POA: Diagnosis not present

## 2018-01-20 DIAGNOSIS — Y95 Nosocomial condition: Secondary | ICD-10-CM | POA: Diagnosis present

## 2018-01-20 DIAGNOSIS — I110 Hypertensive heart disease with heart failure: Secondary | ICD-10-CM | POA: Diagnosis present

## 2018-01-20 DIAGNOSIS — F329 Major depressive disorder, single episode, unspecified: Secondary | ICD-10-CM | POA: Diagnosis present

## 2018-01-20 DIAGNOSIS — Z6841 Body Mass Index (BMI) 40.0 and over, adult: Secondary | ICD-10-CM | POA: Diagnosis not present

## 2018-01-20 DIAGNOSIS — Z87891 Personal history of nicotine dependence: Secondary | ICD-10-CM | POA: Diagnosis not present

## 2018-01-20 DIAGNOSIS — I5032 Chronic diastolic (congestive) heart failure: Secondary | ICD-10-CM | POA: Diagnosis present

## 2018-01-20 DIAGNOSIS — Z881 Allergy status to other antibiotic agents status: Secondary | ICD-10-CM

## 2018-01-20 DIAGNOSIS — Z66 Do not resuscitate: Secondary | ICD-10-CM | POA: Diagnosis present

## 2018-01-20 DIAGNOSIS — J9600 Acute respiratory failure, unspecified whether with hypoxia or hypercapnia: Secondary | ICD-10-CM

## 2018-01-20 DIAGNOSIS — J440 Chronic obstructive pulmonary disease with acute lower respiratory infection: Secondary | ICD-10-CM | POA: Diagnosis present

## 2018-01-20 DIAGNOSIS — J9601 Acute respiratory failure with hypoxia: Secondary | ICD-10-CM

## 2018-01-20 DIAGNOSIS — R0603 Acute respiratory distress: Secondary | ICD-10-CM | POA: Diagnosis present

## 2018-01-20 LAB — CBC WITH DIFFERENTIAL/PLATELET
ABS IMMATURE GRANULOCYTES: 0.07 10*3/uL (ref 0.00–0.07)
BASOS ABS: 0 10*3/uL (ref 0.0–0.1)
Basophils Relative: 1 %
EOS ABS: 0.2 10*3/uL (ref 0.0–0.5)
Eosinophils Relative: 2 %
HCT: 43.2 % (ref 36.0–46.0)
Hemoglobin: 12.7 g/dL (ref 12.0–15.0)
IMMATURE GRANULOCYTES: 1 %
Lymphocytes Relative: 18 %
Lymphs Abs: 1.1 10*3/uL (ref 0.7–4.0)
MCH: 31.1 pg (ref 26.0–34.0)
MCHC: 29.4 g/dL — ABNORMAL LOW (ref 30.0–36.0)
MCV: 105.6 fL — ABNORMAL HIGH (ref 80.0–100.0)
MONOS PCT: 9 %
Monocytes Absolute: 0.5 10*3/uL (ref 0.1–1.0)
NEUTROS ABS: 4.3 10*3/uL (ref 1.7–7.7)
NEUTROS PCT: 69 %
NRBC: 0 % (ref 0.0–0.2)
PLATELETS: 165 10*3/uL (ref 150–400)
RBC: 4.09 MIL/uL (ref 3.87–5.11)
RDW: 15.7 % — AB (ref 11.5–15.5)
WBC: 6.3 10*3/uL (ref 4.0–10.5)

## 2018-01-20 LAB — URINALYSIS, ROUTINE W REFLEX MICROSCOPIC
BACTERIA UA: NONE SEEN
BILIRUBIN URINE: NEGATIVE
Glucose, UA: NEGATIVE mg/dL
KETONES UR: NEGATIVE mg/dL
LEUKOCYTES UA: NEGATIVE
NITRITE: NEGATIVE
Protein, ur: NEGATIVE mg/dL
SPECIFIC GRAVITY, URINE: 1.012 (ref 1.005–1.030)
SQUAMOUS EPITHELIAL / LPF: NONE SEEN (ref 0–5)
WBC, UA: NONE SEEN WBC/hpf (ref 0–5)
pH: 5 (ref 5.0–8.0)

## 2018-01-20 LAB — BLOOD GAS, ARTERIAL
ACID-BASE EXCESS: 33.8 mmol/L — AB (ref 0.0–2.0)
Bicarbonate: 60.9 mmol/L — ABNORMAL HIGH (ref 20.0–28.0)
FIO2: 0.4
O2 SAT: 98.2 %
PCO2 ART: 65 mmHg — AB (ref 32.0–48.0)
PO2 ART: 92 mmHg (ref 83.0–108.0)
Patient temperature: 37
pH, Arterial: 7.58 — ABNORMAL HIGH (ref 7.350–7.450)

## 2018-01-20 LAB — GLUCOSE, CAPILLARY: Glucose-Capillary: 98 mg/dL (ref 70–99)

## 2018-01-20 LAB — BLOOD GAS, VENOUS
ACID-BASE EXCESS: 34 mmol/L — AB (ref 0.0–2.0)
BICARBONATE: 66.9 mmol/L — AB (ref 20.0–28.0)
O2 Saturation: 85 %
PATIENT TEMPERATURE: 37
pCO2, Ven: 108 mmHg (ref 44.0–60.0)
pH, Ven: 7.4 (ref 7.250–7.430)
pO2, Ven: 50 mmHg — ABNORMAL HIGH (ref 32.0–45.0)

## 2018-01-20 LAB — COMPREHENSIVE METABOLIC PANEL
ALT: 22 U/L (ref 0–44)
AST: 18 U/L (ref 15–41)
Albumin: 3.6 g/dL (ref 3.5–5.0)
Alkaline Phosphatase: 85 U/L (ref 38–126)
BILIRUBIN TOTAL: 1.1 mg/dL (ref 0.3–1.2)
BUN: 20 mg/dL (ref 6–20)
CO2: >50 mmol/L — ABNORMAL HIGH (ref 22–32)
Calcium: 9.1 mg/dL (ref 8.9–10.3)
Chloride: 80 mmol/L — ABNORMAL LOW (ref 98–111)
Creatinine, Ser: 0.56 mg/dL (ref 0.44–1.00)
GFR calc non Af Amer: >60 mL/min (ref >60)
GLUCOSE: 108 mg/dL — AB (ref 70–99)
POTASSIUM: 4.2 mmol/L (ref 3.5–5.1)
SODIUM: 144 mmol/L (ref 135–145)
Total Protein: 7 g/dL (ref 6.5–8.1)

## 2018-01-20 LAB — TSH: TSH: 2.598 u[IU]/mL (ref 0.350–4.500)

## 2018-01-20 LAB — LACTIC ACID, PLASMA
LACTIC ACID, VENOUS: 0.6 mmol/L (ref 0.5–1.9)
LACTIC ACID, VENOUS: 0.8 mmol/L (ref 0.5–1.9)
LACTIC ACID, VENOUS: 1.1 mmol/L (ref 0.5–1.9)

## 2018-01-20 LAB — PROCALCITONIN: Procalcitonin: <0.1 ng/mL

## 2018-01-20 LAB — STREP PNEUMONIAE URINARY ANTIGEN: Strep Pneumo Urinary Antigen: NEGATIVE

## 2018-01-20 LAB — BRAIN NATRIURETIC PEPTIDE: B NATRIURETIC PEPTIDE 5: 72 pg/mL (ref 0.0–100.0)

## 2018-01-20 MED ORDER — ENOXAPARIN SODIUM 40 MG/0.4ML ~~LOC~~ SOLN
40.0000 mg | Freq: Two times a day (BID) | SUBCUTANEOUS | Status: DC
  Administered 2018-01-20 – 2018-01-22 (×5): 40 mg via SUBCUTANEOUS
  Filled 2018-01-20 (×5): qty 0.4

## 2018-01-20 MED ORDER — VANCOMYCIN HCL IN DEXTROSE 1-5 GM/200ML-% IV SOLN
1000.0000 mg | Freq: Once | INTRAVENOUS | Status: AC
  Administered 2018-01-20: 1000 mg via INTRAVENOUS
  Filled 2018-01-20: qty 200

## 2018-01-20 MED ORDER — MEDROXYPROGESTERONE ACETATE 10 MG PO TABS
10.0000 mg | ORAL_TABLET | Freq: Every day | ORAL | Status: DC
  Administered 2018-01-20 – 2018-01-22 (×3): 10 mg via ORAL
  Filled 2018-01-20 (×3): qty 1

## 2018-01-20 MED ORDER — IPRATROPIUM-ALBUTEROL 0.5-2.5 (3) MG/3ML IN SOLN: 3.0000 mL | RESPIRATORY_TRACT | Status: DC | PRN

## 2018-01-20 MED ORDER — IPRATROPIUM-ALBUTEROL 0.5-2.5 (3) MG/3ML IN SOLN
RESPIRATORY_TRACT | Status: AC
  Administered 2018-01-20: 3 mL
  Filled 2018-01-20: qty 6

## 2018-01-20 MED ORDER — VANCOMYCIN HCL 10 G IV SOLR
1250.0000 mg | Freq: Three times a day (TID) | INTRAVENOUS | Status: DC
  Filled 2018-01-20 (×3): qty 1250

## 2018-01-20 MED ORDER — LEVOTHYROXINE SODIUM 100 MCG PO TABS
100.0000 ug | ORAL_TABLET | Freq: Every day | ORAL | Status: DC
  Administered 2018-01-20 – 2018-01-22 (×2): 100 ug via ORAL
  Filled 2018-01-20 (×2): qty 1

## 2018-01-20 MED ORDER — ONDANSETRON HCL 4 MG/2ML IJ SOLN: 4.0000 mg | Freq: Four times a day (QID) | INTRAMUSCULAR | Status: DC | PRN

## 2018-01-20 MED ORDER — FERROUS SULFATE 325 (65 FE) MG PO TABS
325.0000 mg | ORAL_TABLET | Freq: Every day | ORAL | Status: DC
  Administered 2018-01-20 – 2018-01-22 (×3): 325 mg via ORAL
  Filled 2018-01-20 (×3): qty 1

## 2018-01-20 MED ORDER — DOCUSATE SODIUM 100 MG PO CAPS
100.0000 mg | ORAL_CAPSULE | Freq: Two times a day (BID) | ORAL | Status: DC
  Administered 2018-01-20 – 2018-01-22 (×5): 100 mg via ORAL
  Filled 2018-01-20 (×5): qty 1

## 2018-01-20 MED ORDER — CITALOPRAM HYDROBROMIDE 20 MG PO TABS
40.0000 mg | ORAL_TABLET | Freq: Every day | ORAL | Status: DC
  Administered 2018-01-20 – 2018-01-22 (×3): 40 mg via ORAL
  Filled 2018-01-20 (×3): qty 2

## 2018-01-20 MED ORDER — SODIUM CHLORIDE 0.9 % IV SOLN
2.0000 g | Freq: Once | INTRAVENOUS | Status: AC
  Administered 2018-01-20: 2 g via INTRAVENOUS
  Filled 2018-01-20: qty 2

## 2018-01-20 MED ORDER — STERILE WATER FOR INJECTION IJ SOLN
INTRAMUSCULAR | Status: AC
  Administered 2018-01-20: 10 mL
  Filled 2018-01-20: qty 10

## 2018-01-20 MED ORDER — ACETAMINOPHEN 650 MG RE SUPP: 650.0000 mg | Freq: Four times a day (QID) | RECTAL | Status: DC | PRN

## 2018-01-20 MED ORDER — ACETAMINOPHEN 325 MG PO TABS
650.0000 mg | ORAL_TABLET | Freq: Four times a day (QID) | ORAL | Status: DC | PRN
  Administered 2018-01-21: 650 mg via ORAL
  Filled 2018-01-20: qty 2

## 2018-01-20 MED ORDER — BUDESONIDE 0.25 MG/2ML IN SUSP
0.2500 mg | Freq: Two times a day (BID) | RESPIRATORY_TRACT | Status: DC
  Administered 2018-01-20 – 2018-01-21 (×3): 0.25 mg via RESPIRATORY_TRACT
  Filled 2018-01-20 (×3): qty 2

## 2018-01-20 MED ORDER — CARVEDILOL 3.125 MG PO TABS
3.1250 mg | ORAL_TABLET | Freq: Two times a day (BID) | ORAL | Status: DC
  Administered 2018-01-20 – 2018-01-22 (×5): 3.125 mg via ORAL
  Filled 2018-01-20 (×5): qty 1

## 2018-01-20 MED ORDER — SODIUM CHLORIDE 0.9 % IV SOLN
2.0000 g | Freq: Three times a day (TID) | INTRAVENOUS | Status: DC
  Filled 2018-01-20 (×3): qty 2

## 2018-01-20 MED ORDER — ACETAZOLAMIDE SODIUM 500 MG IJ SOLR
500.0000 mg | Freq: Once | INTRAMUSCULAR | Status: AC
  Administered 2018-01-20: 500 mg via INTRAVENOUS
  Filled 2018-01-20: qty 500

## 2018-01-20 MED ORDER — ACETAZOLAMIDE SODIUM 500 MG IJ SOLR
500.0000 mg | Freq: Once | INTRAMUSCULAR | Status: AC
  Administered 2018-01-21: 500 mg via INTRAVENOUS
  Filled 2018-01-20: qty 500

## 2018-01-20 MED ORDER — ONDANSETRON HCL 4 MG PO TABS: 4.0000 mg | ORAL_TABLET | Freq: Four times a day (QID) | ORAL | Status: DC | PRN

## 2018-01-20 MED ORDER — FUROSEMIDE 40 MG PO TABS
40.0000 mg | ORAL_TABLET | Freq: Two times a day (BID) | ORAL | Status: DC
  Administered 2018-01-20 – 2018-01-22 (×5): 40 mg via ORAL
  Filled 2018-01-20 (×3): qty 2
  Filled 2018-01-20: qty 1
  Filled 2018-01-20: qty 2

## 2018-01-20 NOTE — ED Notes (Signed)
Changed bedding cleaned patient with help of techs Isabelle Course and Trenton Founds. Applied external urinary catheter.AS

## 2018-01-20 NOTE — ED Notes (Signed)
Attempted to call report to CCU; secretary st charge nurse will call back

## 2018-01-20 NOTE — Progress Notes (Addendum)
Pharmacy Antibiotic Note  Rebecca Bird is a 57 y.o. female admitted on 01/20/2018 with acute on chronic hypoxic hypercapnic respiratory failure. Patient admitted via EMS to the ED and transferred to the ICU on BiPAP.   Plan: Discontinued Cefepime and Vancomycin per CCM rounds discussion as patient does not have signs or symptoms of infection of sepsis.   Height: 5\' 6"  (167.6 cm) Weight: (!) 425 lb 4.3 oz (192.9 kg) IBW/kg (Calculated) : 59.3  Temp (24hrs), Avg:98.3 F (36.8 C), Min:97.7 F (36.5 C), Max:98.8 F (37.1 C)  Recent Labs  Lab 01/20/18 0042 01/20/18 0043 01/20/18 0604 01/20/18 0822  WBC  --  6.3  --   --   CREATININE  --  0.56  --   --   LATICACIDVEN 1.1  --  0.6 0.8    Estimated Creatinine Clearance: 138 mL/min (by C-G formula based on SCr of 0.56 mg/dL).    Allergies  Allergen Reactions  . Levaquin [Levofloxacin In D5w] Itching and Rash    Antimicrobials this admission: 10/18 Vancomycin >> 10/18  10/18 Cefepime >> 10/18    Microbiology results: 10/18 BCx: NG < 1 day 10/18 MRSA PCR: pending    10/18 CXR: Possible mild ground-glass infiltrate in the right mid lung . Thank you for allowing pharmacy to be a part of this patient's care.   Mauri Reading, PharmD Pharmacy Resident  01/20/2018 12:53 PM

## 2018-01-20 NOTE — Progress Notes (Signed)
CODE SEPSIS - PHARMACY COMMUNICATION  **Broad Spectrum Antibiotics should be administered within 1 hour of Sepsis diagnosis**  Time Code Sepsis Called/Page Received: 1018 0041  Antibiotics Ordered: 1018 0041  Time of 1st antibiotic administration: 1018 0151  Additional action taken by pharmacy:   If necessary, Name of Provider/Nurse Contacted:     Erich Montane ,PharmD Clinical Pharmacist  01/20/2018  2:05 AM

## 2018-01-20 NOTE — ED Notes (Signed)
Attempted to call report to CCU, secretary reports nurse will call back

## 2018-01-20 NOTE — Progress Notes (Signed)
Patient's ABG done with oxygen nasal cannula shows marked improvement in PCO2 and pH -Patient is awake and talking - Overall doing well can be transferred out of ICU with use of BiPAP at nighttime and as needed Results for Rebecca Bird, Rebecca Bird (MRN [id number]) as of 01/20/2018 15:44  Ref. Range 01/20/2018 14:00  Sample type Unknown ARTERIAL DRAW  Delivery systems Unknown NASAL CANNULA  FIO2 Unknown 0.40  pH, Arterial Latest Ref Range: 7.350 - 7.450  7.58 (H)  pCO2 arterial Latest Ref Range: 32.0 - 48.0 mmHg 65 (H)  pO2, Arterial Latest Ref Range: 83.0 - 108.0 mmHg 92  Acid-Base Excess Latest Ref Range: 0.0 - 2.0 mmol/L 33.8 (H)  Bicarbonate Latest Ref Range: 20.0 - 28.0 mmol/L 60.9 (H)  O2 Saturation Latest Units: % 98.2  Patient temperature Unknown 37.0  Collection site Unknown RIGHT RADIAL  Allens test (pass/fail) Latest Ref Range: PASS  PASS

## 2018-01-20 NOTE — ED Notes (Signed)
BC x 2 obtained 

## 2018-01-20 NOTE — ED Triage Notes (Signed)
Per EMS pt reports not breathing well x 2-3 days but didn't want staff to call 911. On their arrival sat 70% on pts regular cpap. En route sat 89-90% with EMS cpap. On arrival pt is A&O x4

## 2018-01-20 NOTE — Progress Notes (Signed)
PT refused ABG , PROVIDER NOTIFIED,

## 2018-01-20 NOTE — Progress Notes (Signed)
OT Cancellation Note  Patient Details Name: Rebecca Bird MRN: [id number] DOB: April 30, 1960   Cancelled Treatment:    Reason Eval/Treat Not Completed: Other (comment). Order received, chart reviewed. Pt with nursing staff upon initial attempt, unavailable for OT evaluation. Will re-attempt OT evaluation at later date/time as pt is available and medically appropriate.   Richrd Prime, MPH, MS, OTR/L ascom [phone] 01/20/18, 3:02 PM

## 2018-01-20 NOTE — Evaluation (Signed)
Physical Therapy Evaluation Patient Details Name: Rebecca Bird MRN: [id number] DOB: [birth year] Today's Date: 01/20/2018   History of Present Illness  The patient is a 57 yo F with past medical history of COPD, CHF, hypothyroidism, and hypertension presented to the emergency department with respiratory distress.  She had become increasingly dyspneic over the last 3 days.  Oxygen saturations were reportedly 70% when EMS arrived.  She was transported to the emergency department on CPAP and she was placed on BiPAP with 60% FiO2 in the emergency department.  Chest x-ray showed vascular congestion as well as potential groundglass infiltrate in the mid right lung which prompted the emergency department staff to initiate sepsis protocol prior to calling the hospitalist service for admission.  Assessment includes acute on chronic respiratory failure with hypoxia and hypercapnia.     Clinical Impression  Pt presents with significant deficits in strength, transfers, mobility, and gait.  Pt was dependent level with attempts at rolling in the bed and could not tolerate attempts at putting bed into chair position secondary to abdominal pressure.  Pt tolerated below therex with therapeutic rest breaks and cues for breathing with SpO2 dropping to a low of 87% on 4LO2/min but mostly remaining between 88-91%.  Pt reports that she recently transitioned from a SNF to a LTC facility and is dependent with all functional mobility and ADLs.  Pt reports expects to be at her LTC indefinitely and is applying for medicaid.  Pt states that she had been receiving therapy daily at her facility with the goal of eventually walking again.  Pt will benefit from continuation of PT services at her LTC facility upon discharge to safely address above deficits for decreased caregiver assistance and improved quality of life.      Follow Up Recommendations Other (comment)(Pt was receiving rehab at her LTC facility prior to this admission per  pt report with recommendation to continue this upon discharge)    Equipment Recommendations  None recommended by PT    Recommendations for Other Services       Precautions / Restrictions Precautions Precautions: Fall Restrictions Weight Bearing Restrictions: No      Mobility  Bed Mobility Overal bed mobility: Needs Assistance Bed Mobility: Rolling Rolling: Total assist;+2 for physical assistance         General bed mobility comments: Attempted rolling in bed with pt at near dependent level offering little assistance  Transfers                 General transfer comment: Deferred; hoyer lift for transfers per pt report  Ambulation/Gait             General Gait Details: Unable/unsafe to attempt  Stairs            Wheelchair Mobility    Modified Rankin (Stroke Patients Only)       Balance                                             Pertinent Vitals/Pain Pain Assessment: No/denies pain    Home Living Family/patient expects to be discharged to:: Skilled nursing facility(LTC facility)                 Additional Comments: Pt recently transistioned from SNF to LTC    Prior Function Level of Independence: Needs assistance   Gait / Transfers Assistance Needed: Pt was  getting assist with all transfer needs at SNF/LTC facility with use of a hoyer lift, non-ambulatory "for a long time" but pt unsure of details  ADL's / Homemaking Assistance Needed: Dependent level care for all ADLs        Hand Dominance        Extremity/Trunk Assessment   Upper Extremity Assessment Upper Extremity Assessment: Generalized weakness;RUE deficits/detail RUE Deficits / Details: Per pt, has a R shoulder dislocation, scheduled for consult with surgeon, however continues to get delayed due to medical issues.  RUE: Unable to fully assess due to pain    Lower Extremity Assessment Lower Extremity Assessment: Generalized weakness        Communication   Communication: No difficulties  Cognition Arousal/Alertness: Awake/alert Behavior During Therapy: WFL for tasks assessed/performed Overall Cognitive Status: No family/caregiver present to determine baseline cognitive functioning                                 General Comments: Pt somewhat confused and had difficulty with details when providing history, stated "it's all in my chart" several times when was unable to answer questions      General Comments      Exercises Total Joint Exercises Ankle Circles/Pumps: Strengthening;AROM;Both;10 reps;15 reps Quad Sets: Strengthening;Both;10 reps;15 reps Gluteal Sets: Strengthening;Both;10 reps;5 reps Short Arc Quad: AROM;Both;10 reps;5 reps Heel Slides: AROM;Both;10 reps;5 reps Hip ABduction/ADduction: AROM;Both;10 reps;5 reps Straight Leg Raises: AAROM;Both;10 reps;5 reps Other Exercises Other Exercises: HEP education for BLE APs, QS, and GS x 10 every 1-2 hours as tolerated   Assessment/Plan    PT Assessment Patient needs continued PT services  PT Problem List Decreased strength;Decreased activity tolerance;Decreased mobility       PT Treatment Interventions Functional mobility training;Therapeutic activities;Therapeutic exercise;Balance training;Patient/family education    PT Goals (Current goals can be found in the Care Plan section)  Acute Rehab PT Goals Patient Stated Goal: To continue with therapy at her LTC facility and eventually walk again PT Goal Formulation: With patient Time For Goal Achievement: 02/02/18 Potential to Achieve Goals: Fair    Frequency Min 2X/week   Barriers to discharge        Co-evaluation               AM-PAC PT "6 Clicks" Daily Activity  Outcome Measure Difficulty turning over in bed (including adjusting bedclothes, sheets and blankets)?: Unable Difficulty moving from lying on back to sitting on the side of the bed? : Unable Difficulty sitting down on  and standing up from a chair with arms (e.g., wheelchair, bedside commode, etc,.)?: Unable Help needed moving to and from a bed to chair (including a wheelchair)?: Total Help needed walking in hospital room?: Total Help needed climbing 3-5 steps with a railing? : Total 6 Click Score: 6    End of Session Equipment Utilized During Treatment: Oxygen Activity Tolerance: Patient tolerated treatment well Patient left: in bed;with call bell/phone within reach Nurse Communication: Mobility status;Other (comment)(Pt left in near full supine to assist with urination with purwick per pt request, nsg notified) PT Visit Diagnosis: Muscle weakness (generalized) (M62.81)    Time: 1610-9604 PT Time Calculation (min) (ACUTE ONLY): 26 min   Charges:   PT Evaluation $PT Eval Low Complexity: 1 Low PT Treatments $Therapeutic Exercise: 8-22 mins        D. Elly Modena PT, DPT 01/20/18, 12:31 PM

## 2018-01-20 NOTE — H&P (Signed)
Rebecca Bird is an 57 y.o. female.   Chief Complaint: Shortness of breath HPI: The patient with past medical history of COPD CHF, hypothyroidism and hypertension presents to the emergency department with respiratory distress.  She has become increasingly dyspneic over the last 3 days.  Oxygen saturations were reportedly 70% when EMS arrived.  She was transported to the emergency department on CPAP and she was placed on BiPAP with 60% FiO2 in the emergency department.  Chest x-ray showed vascular congestion as well as potential groundglass infiltrate in the mid right lung which prompted the emergency department staff to initiate sepsis protocol prior to calling the hospitalist service for admission.   Past Medical History:  Diagnosis Date  . Anemia   . CHF (congestive heart failure) ([place])   . COPD (chronic obstructive pulmonary disease) ([place])   . Depression   . Hypertension   . Sleep apnea   . Thyroid disease     History reviewed. No pertinent surgical history. Patient has hard time talking due to NIPPV mask and respiratory distress  No family history on file. Difficult for patient to answer due to respiratory distress  Social History:  reports that she has quit smoking. She has never used smokeless tobacco. Her alcohol and drug histories are not on file.  Allergies:  Allergies  Allergen Reactions  . Levaquin [Levofloxacin In D5w] Itching and Rash    Medications Prior to Admission  Medication Sig Dispense Refill  . budesonide (PULMICORT) 0.25 MG/2ML nebulizer solution Take 2 mLs (0.25 mg total) by nebulization 2 (two) times daily. 60 mL 12  . carvedilol (COREG) 3.125 MG tablet Take 3.125 mg by mouth 2 (two) times daily with a meal.    . citalopram (CELEXA) 40 MG tablet Take 40 mg by mouth daily.     . ferrous sulfate 325 (65 FE) MG tablet Take 325 mg by mouth daily with breakfast.    . furosemide (LASIX) 40 MG tablet Take 1 tablet (40 mg total) by mouth 2 (two) times daily. 30  tablet   . ipratropium-albuterol (DUONEB) 0.5-2.5 (3) MG/3ML SOLN Take 3 mLs by nebulization every 6 (six) hours as needed (shortness of breath). (Patient taking differently: Take 3 mLs by nebulization every 6 (six) hours as needed (shortness of breath/ wheezing). ) 360 mL 1  . levothyroxine (SYNTHROID, LEVOTHROID) 100 MCG tablet Take 1 tablet (100 mcg total) by mouth daily before breakfast. 30 tablet 2  . Turmeric 450 MG CAPS Take 450 mg by mouth daily.      Results for orders placed or performed during the hospital encounter of 01/20/18 (from the past 48 hour(s))  Lactic acid, plasma     Status: None   Collection Time: 01/20/18 12:42 AM  Result Value Ref Range   Lactic Acid, Venous 1.1 0.5 - 1.9 mmol/L    Comment: Performed at [facility], [address] [place], [place] [address]  Comprehensive metabolic panel     Status: Abnormal   Collection Time: 01/20/18 12:43 AM  Result Value Ref Range   Sodium 144 135 - 145 mmol/L   Potassium 4.2 3.5 - 5.1 mmol/L   Chloride 80 (L) 98 - 111 mmol/L   CO2 >50 (H) 22 - 32 mmol/L    Comment: RESULTS CONFIRMED BY MANUAL DILUTION [place]    Glucose, Bld 108 (H) 70 - 99 mg/dL   BUN 20 6 - 20 mg/dL   Creatinine, Ser 0.56 0.44 - 1.00 mg/dL   Calcium 9.1 8.9 -  10.3 mg/dL   Total Protein 7.0 6.5 - 8.1 g/dL   Albumin 3.6 3.5 - 5.0 g/dL   AST 18 15 - 41 U/L   ALT 22 0 - 44 U/L   Alkaline Phosphatase 85 38 - 126 U/L   Total Bilirubin 1.1 0.3 - 1.2 mg/dL   GFR calc non Af Amer >60 >60 mL/min   GFR calc Af Amer >60 >60 mL/min    Comment: (NOTE) The eGFR has been calculated using the CKD EPI equation. This calculation has not been validated in all clinical situations. eGFR's persistently <60 mL/min signify possible Chronic Kidney Disease. CORRECTED ON 10/18 AT 0235: PREVIOUSLY REPORTED AS >60    Anion gap NOT CALCULATED 5 - 15    Comment: Performed at [facility], [address] [place], [place] [address]  CBC WITH DIFFERENTIAL      Status: Abnormal   Collection Time: 01/20/18 12:43 AM  Result Value Ref Range   WBC 6.3 4.0 - 10.5 K/uL   RBC 4.09 3.87 - 5.11 MIL/uL   Hemoglobin 12.7 12.0 - 15.0 g/dL   HCT 43.2 36.0 - 46.0 %   MCV 105.6 (H) 80.0 - 100.0 fL   MCH 31.1 26.0 - 34.0 pg   MCHC 29.4 (L) 30.0 - 36.0 g/dL   RDW 15.7 (H) 11.5 - 15.5 %   Platelets 165 150 - 400 K/uL   nRBC 0.0 0.0 - 0.2 %   Neutrophils Relative % 69 %   Neutro Abs 4.3 1.7 - 7.7 K/uL   Lymphocytes Relative 18 %   Lymphs Abs 1.1 0.7 - 4.0 K/uL   Monocytes Relative 9 %   Monocytes Absolute 0.5 0.1 - 1.0 K/uL   Eosinophils Relative 2 %   Eosinophils Absolute 0.2 0.0 - 0.5 K/uL   Basophils Relative 1 %   Basophils Absolute 0.0 0.0 - 0.1 K/uL   Immature Granulocytes 1 %   Abs Immature Granulocytes 0.07 0.00 - 0.07 K/uL    Comment: Performed at [facility], [address] [place], [place] [address]  Blood gas, venous     Status: Abnormal   Collection Time: 01/20/18 12:52 AM  Result Value Ref Range   pH, Ven 7.40 7.250 - 7.430   pCO2, Ven 108 (HH) 44.0 - 60.0 mmHg    Comment: CRITICAL RESULT CALLED TO, READ BACK BY AND VERIFIED WITH: DR. Alfonso Patten. BROWN AT 0100 ON 01/20/18 KSL    pO2, Ven 50.0 (H) 32.0 - 45.0 mmHg   Bicarbonate 66.9 (H) 20.0 - 28.0 mmol/L   Acid-Base Excess 34.0 (H) 0.0 - 2.0 mmol/L   O2 Saturation 85.0 %   Patient temperature 37.0    Collection site VENOUS    Sample type VENOUS     Comment: Performed at [facility], [address] [place], [place] [address]  Urinalysis, Routine w reflex microscopic     Status: Abnormal   Collection Time: 01/20/18  4:32 AM  Result Value Ref Range   Color, Urine YELLOW (A) YELLOW   APPearance HAZY (A) CLEAR   Specific Gravity, Urine 1.012 1.005 - 1.030   pH 5.0 5.0 - 8.0   Glucose, UA NEGATIVE NEGATIVE mg/dL   Hgb urine dipstick SMALL (A) NEGATIVE   Bilirubin Urine NEGATIVE NEGATIVE   Ketones, ur NEGATIVE NEGATIVE mg/dL   Protein, ur NEGATIVE NEGATIVE mg/dL    Nitrite NEGATIVE NEGATIVE   Leukocytes, UA NEGATIVE NEGATIVE   RBC / HPF 0-5 0 - 5 RBC/hpf  WBC, UA NONE SEEN 0 - 5 WBC/hpf   Bacteria, UA NONE SEEN NONE SEEN   Squamous Epithelial / LPF NONE SEEN 0 - 5   Mucus PRESENT     Comment: Performed at [facility], [address] [place], [place] [address]  Glucose, capillary     Status: None   Collection Time: 01/20/18  4:55 AM  Result Value Ref Range   Glucose-Capillary 98 70 - 99 mg/dL  TSH     Status: None   Collection Time: 01/20/18  6:04 AM  Result Value Ref Range   TSH 2.598 0.350 - 4.500 uIU/mL    Comment: Performed by a 3rd Generation assay with a functional sensitivity of <=0.01 uIU/mL. Performed at [facility], [address] [place], [place] [address]   Lactic acid, plasma     Status: None   Collection Time: 01/20/18  6:04 AM  Result Value Ref Range   Lactic Acid, Venous 0.6 0.5 - 1.9 mmol/L    Comment: Performed at [facility], [address] [place],  [address]  Brain natriuretic peptide     Status: None   Collection Time: 01/20/18  6:04 AM  Result Value Ref Range   B Natriuretic Peptide 72.0 0.0 - 100.0 pg/mL    Comment: Performed at [facility], [address] [place],  [address]   Dg Chest Port 1 View  Result Date: 01/20/2018 CLINICAL DATA:  Shortness of breath EXAM: PORTABLE CHEST 1 VIEW COMPARISON:  12/30/2017, 12/29/2017, 11/01/2017 FINDINGS: Cardiomegaly with vascular congestion and mild perihilar edema. There may be mild ground-glass opacity adjacent to the right fissure. No pneumothorax. IMPRESSION: 1. Cardiomegaly with vascular congestion and mild perihilar edema 2. Possible mild ground-glass infiltrate in the right mid lung. Electronically Signed   By: Donavan Foil M.D.   On: 01/20/2018 01:00    Review of Systems  Constitutional: Negative for chills and fever.  HENT: Negative for sore throat and tinnitus.   Eyes: Negative for blurred vision and  redness.  Respiratory: Positive for shortness of breath. Negative for cough.   Cardiovascular: Negative for chest pain, palpitations, orthopnea and PND.  Gastrointestinal: Negative for abdominal pain, diarrhea, nausea and vomiting.  Genitourinary: Negative for dysuria, frequency and urgency.  Musculoskeletal: Negative for joint pain and myalgias.  Skin: Negative for rash.       No lesions  Neurological: Negative for speech change, focal weakness and weakness.  Endo/Heme/Allergies: Does not bruise/bleed easily.       No temperature intolerance  Psychiatric/Behavioral: Negative for depression and suicidal ideas.    Blood pressure 130/80, pulse 93, temperature 98.3 F (36.8 C), temperature source Axillary, resp. rate (!) 27, height _0  (1.676 m), weight (!) 192.9 kg, SpO2 94 %. Physical Exam  Vitals reviewed. Constitutional: She is oriented to person, place, and time. She appears well-developed and well-nourished. No distress.  HENT:  Head: Normocephalic and atraumatic.  Mouth/Throat: Oropharynx is clear and moist.  Eyes: Pupils are equal, round, and reactive to light. Conjunctivae and EOM are normal. No scleral icterus.  Neck: Normal range of motion. Neck supple. No JVD present. No tracheal deviation present. No thyromegaly present.  Cardiovascular: Normal rate, regular rhythm and normal heart sounds. Exam reveals no gallop and no friction rub.  No murmur heard. Respiratory: Breath sounds normal. She is in respiratory distress.  GI: Soft. Bowel sounds are normal. She exhibits no distension. There is no tenderness.  Genitourinary:  Genitourinary Comments: Deferred  Musculoskeletal: Normal  range of motion. She exhibits no edema.  Lymphadenopathy:    She has no cervical adenopathy.  Neurological: She is alert and oriented to person, place, and time. No cranial nerve deficit. She exhibits normal muscle tone.  Skin: Skin is warm and dry. No rash noted. No erythema.  Psychiatric: She has  a normal mood and affect. Her behavior is normal. Judgment and thought content normal.     Assessment/Plan This is a 57 year old female admitted for respiratory failure. 1.  Respiratory failure: Acute on chronic; with hypoxia and hypercapnia.  Patient is retaining some CO2 and has difficulty oxygenating due to mild pulmonary edema. Wean supplemental oxygen as needed.  BiPAP is improved work of breathing but the patient will need some more time prior to weaning to nasal cannula.  This is a frequent problem for the patient and she does not yet meet criteria for sepsis.  I have not continued antibiotics at this time. 2.  CHF: Diastolic; chronic.  Continue Lasix per home regimen 3.  Hypertension: Controlled; continue Coreg 4.  Hypothyroidism: Check TSH; continue Synthroid 5.  DVT prophylaxis: Lovenox 6.  GI prophylaxis: None The patient is a full code.  Time spent on admission orders and critical care approximately 45 minutes  Harrie Foreman, MD 01/20/2018, 7:02 AM

## 2018-01-20 NOTE — Consult Note (Signed)
Name: Rebecca Bird MRN: [id number] DOB: [birth year]    ADMISSION DATE:  01/20/2018 CONSULTATION DATE:  01/20/2018  REFERRING MD :  Dr. Sheryle Hail  CHIEF COMPLAINT:  Acute Respiratory Distress  BRIEF PATIENT DESCRIPTION:  57 y.o. Female admitted with Acute on Chronic Hypoxic Hypercapnic Respiratory Failure requiring BiPAP in setting of HCAP.  SIGNIFICANT EVENTS  01/20/18>> Admission to [facility] Stepdown  STUDIES:   CULTURES: Blood x2 01/20/18>> Sputum 01/20/18>> Strep pneumo urinary antigen 01/20/18>> Legionella urinary antigen 01/20/18>>  ANTIBIOTICS: Vancomycin 10/18>> Cefepime 10/18>>  HISTORY OF PRESENT ILLNESS:   Rebecca Bird is a 57 y.o. Female with a PMH of COPD, CHF, Sleep Apnea, HTN, thyroid disease, and anemia who presents to [facility] ED on 01/20/18 with complaints of respiratory distress.  She reports that her shortness of breath has been progressive over the past 3 days.  She denies any fever, but does report cough.  Pt was noted to be hypoxic with O2 sats 89% upon EMS arrival, therefore she was placed on CPAP.  Upon arrival to ED she was placed on BiPAP and received 2 DuoNebs with noted improvement of her respiratory status.  Initial workup in the ED revealed: Venous blood gas with hypoxia and hypercapnia, WBC 6.3, Lactic acid 1.1, and serum CO2 >50.  CXR was concerning for cardiomegaly with vascular congestion/mild edema and a possible ground glass infiltrate in the right mid lung.  She is being admitted to [facility] unit for treatment of Acute on Chronic Hypoxic Hypercapnic Respiratory Failure in setting of HCAP requiring BiPAP.  PAST MEDICAL HISTORY :   has a past medical history of Anemia, CHF (congestive heart failure) ([facility]), COPD (chronic obstructive pulmonary disease) ([facility]), Depression, Hypertension, Sleep apnea, and Thyroid disease.  has no past surgical history on file. Prior to Admission medications   Medication Sig Start Date End Date Taking? Authorizing Provider   budesonide (PULMICORT) 0.25 MG/2ML nebulizer solution Take 2 mLs (0.25 mg total) by nebulization 2 (two) times daily. 01/01/18  Yes Altamese Dilling, MD  carvedilol (COREG) 3.125 MG tablet Take 3.125 mg by mouth 2 (two) times daily with a meal.   Yes [provider]  citalopram (CELEXA) 40 MG tablet Take 40 mg by mouth daily.    Yes [provider]  ferrous sulfate 325 (65 FE) MG tablet Take 325 mg by mouth daily with breakfast.   Yes [provider]  furosemide (LASIX) 40 MG tablet Take 1 tablet (40 mg total) by mouth 2 (two) times daily. 11/25/17  Yes Shaune Pollack, MD  ipratropium-albuterol (DUONEB) 0.5-2.5 (3) MG/3ML SOLN Take 3 mLs by nebulization every 6 (six) hours as needed (shortness of breath). Patient taking differently: Take 3 mLs by nebulization every 6 (six) hours as needed (shortness of breath/ wheezing).  11/05/17  Yes Enid Baas, MD  levothyroxine (SYNTHROID, LEVOTHROID) 100 MCG tablet Take 1 tablet (100 mcg total) by mouth daily before breakfast. 11/06/17  Yes Enid Baas, MD  Turmeric 450 MG CAPS Take 450 mg by mouth daily.   Yes [provider]   Allergies  Allergen Reactions  . Levaquin [Levofloxacin In D5w] Itching and Rash    FAMILY HISTORY:  family history is not on file. SOCIAL HISTORY:  reports that she has quit smoking. She has never used smokeless tobacco.  REVIEW OF SYSTEMS:  Positives in BOLD Constitutional: Negative for fever, chills, weight loss, malaise/fatigue and diaphoresis.  HENT: Negative for hearing loss, ear pain, nosebleeds, congestion, sore throat, neck pain, tinnitus and ear discharge.  Eyes: Negative for blurred vision, double vision, photophobia, pain, discharge and redness.  Respiratory: Negative for +cough, hemoptysis, sputum production, +shortness of breath, wheezing and stridor.   Cardiovascular: Negative for chest pain, palpitations, orthopnea, claudication, +leg swelling and PND.    Gastrointestinal: Negative for heartburn, nausea, vomiting, abdominal pain, diarrhea, constipation, blood in stool and melena.  Genitourinary: Negative for dysuria, urgency, frequency, hematuria and flank pain.  Musculoskeletal: Negative for myalgias, back pain, joint pain and falls.  Skin: Negative for itching and rash.  Neurological: Negative for dizziness, tingling, tremors, sensory change, speech change, focal weakness, seizures, loss of consciousness, weakness and headaches.  Endo/Heme/Allergies: Negative for environmental allergies and polydipsia. Does not bruise/bleed easily.  SUBJECTIVE:  Pt reports she is still Short of breath, but it has improved since admission on BiPAP Denies wheezing, sputum production, fever, chills, or chest pain On BiPAP 60%  VITAL SIGNS: Temp:  [98.8 F (37.1 C)] 98.8 F (37.1 C) (10/18 0046) Pulse Rate:  [85-92] 92 (10/18 0200) Resp:  [16-43] 25 (10/18 0200) BP: (108-132)/(71-92) 132/71 (10/18 0200) SpO2:  [96 %-100 %] 98 % (10/18 0200) Weight:  [204 kg] 204 kg (10/18 0048)  PHYSICAL EXAMINATION: General:  Acute on chronically ill appearing female, laying in bed, on BiPAP, in NAD Neuro:  Awake, A&O x4, follow commands, no focal deficits HEENT:  Atraumatic, normocephalic, no JVD, neck supple, Pupils PERRL 4 mm bilaterally brisk Cardiovascular:  RRR, s1s2, no M/R/G Lungs:  Diminished bilaterally, no wheezing, even, nonlabored, BiPAP assisted Abdomen:  Morbidly obese, soft, nontender, BS+ x4 Musculoskeletal:  No deformities, 2+ LE edema Skin:  Warm, dry.  No obvious rashes, lesions, or ulcerations  Recent Labs  Lab 01/20/18 0043  NA 144  K 4.2  CL 80*  CO2 >50*  BUN 20  CREATININE 0.56  GLUCOSE 108*   Recent Labs  Lab 01/20/18 0043  HGB 12.7  HCT 43.2  WBC 6.3  PLT 165   Dg Chest Port 1 View  Result Date: 01/20/2018 CLINICAL DATA:  Shortness of breath EXAM: PORTABLE CHEST 1 VIEW COMPARISON:  12/30/2017, 12/29/2017, 11/01/2017  FINDINGS: Cardiomegaly with vascular congestion and mild perihilar edema. There may be mild ground-glass opacity adjacent to the right fissure. No pneumothorax. IMPRESSION: 1. Cardiomegaly with vascular congestion and mild perihilar edema 2. Possible mild ground-glass infiltrate in the right mid lung. Electronically Signed   By: Jasmine Pang M.D.   On: 01/20/2018 01:00    ASSESSMENT / PLAN:  PULMONARY A: Acute on Chronic Hypoxic Hypercapnic Respiratory Failure in setting of HCAP Hx: COPD, sleep apnea P: Supplemental O2 to maintain O2 sats 88 to 94% BiPAP, wean as tolerated Follow intermittent CXR and ABG Prn Bronchodilators Budesonide nebs Continue Vancomycin & Cefepime for now  CARDIOVASCULAR: A: Cardiomegaly with vascular congestion and mild edema on CXR  Hx: CHF, HTN P: Cardiac monitoring Maintain MAP >65 Obtain BNP Continue home Lasix 40 mg BID Continue Coreg  INFECTIOUS A: Meets SIRS criteria HCAP P: Monitor fever curve Trend WBC and Procalcitonin Trend Lactic acid Follow cultures as above Continue Vancomycin and Cefepime for now  RENAL A: No acute issues P: Monitor I&O's / urinary output Follow BMP Ensure adequate renal perfusion Avoid nephrotoxic agents as able Replace electrolytes as indicated Trend Lactic acid  HEME A: No acute issues Hx: Anemia P: Monitor for s/sx of bleeding Trend CBC Lovenox for VTE prophylaxis Transfuse for Hgb <7.0  ENDOCRINE A: Hypothyroidism P: Continue home synthroid Follow glucose on BMP  NEURO A: No acute  issues Hx: Depression P: Continue home Celexa    DISPOSITION: Stepdown GOALS OF CARE: DNR VTE PROPHYLAXIS: Lovenox UPDATES: Updated pt at bedside 01/20/18     Harlon Ditty, [facility] Pager: [phone]   01/20/2018, 3:12 AM

## 2018-01-20 NOTE — Progress Notes (Signed)
[employer] [facility] - [place] at [facility]   PATIENT NAME: Airika Alkhatib    MR#:  [id number]  DATE OF BIRTH:  September 28, 1960  SUBJECTIVE:  CHIEF COMPLAINT:   Chief Complaint  Patient presents with  . Respiratory Distress   Came with respi distress, noted to have High CO2 and compensated high Bicarb. Off Bipap, when I saw her.  REVIEW OF SYSTEMS:  CONSTITUTIONAL: No fever, fatigue or weakness.  EYES: No blurred or double vision.  EARS, NOSE, AND THROAT: No tinnitus or ear pain.  RESPIRATORY: No cough, have shortness of breath, wheezing or hemoptysis.  CARDIOVASCULAR: No chest pain, orthopnea, edema.  GASTROINTESTINAL: No nausea, vomiting, diarrhea or abdominal pain.  GENITOURINARY: No dysuria, hematuria.  ENDOCRINE: No polyuria, nocturia,  HEMATOLOGY: No anemia, easy bruising or bleeding SKIN: No rash or lesion. MUSCULOSKELETAL: No joint pain or arthritis.   NEUROLOGIC: No tingling, numbness, weakness.  PSYCHIATRY: No anxiety or depression.   ROS  DRUG ALLERGIES:   Allergies  Allergen Reactions  . Levaquin [Levofloxacin In D5w] Itching and Rash    VITALS:  Blood pressure 107/77, pulse 90, temperature 98.5 F (36.9 C), temperature source Oral, resp. rate 20, height 5\' 6"  (1.676 m), weight (!) 192.9 kg, SpO2 92 %.  PHYSICAL EXAMINATION:  GENERAL:  57 y.o.-year-old super obese patient lying in the bed with no acute distress.  EYES: Pupils equal, round, reactive to light and accommodation. No scleral icterus. Extraocular muscles intact.  HEENT: Head atraumatic, normocephalic. Oropharynx and nasopharynx clear.  NECK:  Supple, no jugular venous distention. No thyroid enlargement, no tenderness.  LUNGS: Normal breath sounds bilaterally, no wheezing, rales,rhonchi or crepitation. No use of accessory muscles of respiration. Supplemental oxygen in use. CARDIOVASCULAR: S1, S2 normal. No murmurs, rubs, or gallops.  ABDOMEN: Soft, nontender, nondistended. Bowel sounds  present. No organomegaly or mass.  EXTREMITIES: Chronic pedal edema,no cyanosis, or clubbing.  NEUROLOGIC: Cranial nerves II through XII are intact. Muscle strength 5/5 in all extremities. Sensation intact. Gait not checked.  PSYCHIATRIC: The patient is alert and oriented x 3.  SKIN: No obvious rash, lesion, or ulcer.   Physical Exam LABORATORY PANEL:   CBC Recent Labs  Lab 01/20/18 0043  WBC 6.3  HGB 12.7  HCT 43.2  PLT 165   ------------------------------------------------------------------------------------------------------------------  Chemistries  Recent Labs  Lab 01/20/18 0043  NA 144  K 4.2  CL 80*  CO2 >50*  GLUCOSE 108*  BUN 20  CREATININE 0.56  CALCIUM 9.1  AST 18  ALT 22  ALKPHOS 85  BILITOT 1.1   ------------------------------------------------------------------------------------------------------------------  Cardiac Enzymes No results for input(s): TROPONINI in the last 168 hours. ------------------------------------------------------------------------------------------------------------------  RADIOLOGY:  Dg Chest Port 1 View  Result Date: 01/20/2018 CLINICAL DATA:  Shortness of breath EXAM: PORTABLE CHEST 1 VIEW COMPARISON:  12/30/2017, 12/29/2017, 11/01/2017 FINDINGS: Cardiomegaly with vascular congestion and mild perihilar edema. There may be mild ground-glass opacity adjacent to the right fissure. No pneumothorax. IMPRESSION: 1. Cardiomegaly with vascular congestion and mild perihilar edema 2. Possible mild ground-glass infiltrate in the right mid lung. Electronically Signed   By: Jasmine Pang M.D.   On: 01/20/2018 01:00    ASSESSMENT AND PLAN:   Active Problems:   Acute on chronic respiratory failure with hypoxemia (HCC)   1.  Respiratory failure: Acute on chronic; with hypoxia and hypercapnia.  Patient is retaining some CO2 and has difficulty oxygenating due to mild pulmonary edema. Wean supplemental oxygen as needed.   Likely Chronic  retainer- Intencivist suggested provera  for 4-5 days and Diamox. No need for ABx now. 2.  CHF: Diastolic; chronic.  Continue Lasix per home regimen 3.  Hypertension: Controlled; continue Coreg 4.  Hypothyroidism: Check TSH; continue Synthroid 5.  DVT prophylaxis: Lovenox 6.  GI prophylaxis: None   All the records are reviewed and case discussed with Care Management/Social Workerr. Management plans discussed with the patient, family and they are in agreement.  CODE STATUS: DNR  TOTAL TIME TAKING CARE OF THIS PATIENT: 35 minutes.     POSSIBLE D/C IN 2-3 DAYS, DEPENDING ON CLINICAL CONDITION.   Altamese Dilling M.D on 01/20/2018   Between 7am to 6pm - Pager - [phone]  After 6pm go to [email or web address] - password [email or web address]  [facility]  Office  [phone]  CC: Primary care physician; Dinges, Roanna Banning, NP  Note: This dictation was prepared with Dragon dictation along with smaller phrase technology. Any transcriptional errors that result from this process are unintentional.

## 2018-01-20 NOTE — Progress Notes (Signed)
Pt transferred to bariatric sizewise bed without issue. Pt tolerated transfer well. Pt denies pain, no signs or complaints of distress. VS WNL. This RN will continue to monitor.

## 2018-01-20 NOTE — Progress Notes (Signed)
Pharmacy Antibiotic Note  Rebecca Bird is a 57 y.o. female admitted on 01/20/2018 with pneumonia.  Pharmacy has been consulted for vancomycin and cefepime dosing.  Plan: DW 116 kg  Vd 81L kei 0.096 hr-1  T1/1 7 hours Vancomycin 1250 mg q 8 hours ordered. Not candidate for stacked dosing. Level before 5th dose  Cefepime 2 grams q 8 hours ordered  Height: 5\' 5"  (165.1 cm) Weight: (!) 449 lb 11.8 oz (204 kg) IBW/kg (Calculated) : 57  Temp (24hrs), Avg:98.8 F (37.1 C), Min:98.8 F (37.1 C), Max:98.8 F (37.1 C)  Recent Labs  Lab 01/20/18 0042 01/20/18 0043  WBC  --  6.3  CREATININE  --  0.56  LATICACIDVEN 1.1  --     Estimated Creatinine Clearance: 141.8 mL/min (by C-G formula based on SCr of 0.56 mg/dL).    Allergies  Allergen Reactions  . Levaquin [Levofloxacin In D5w] Itching and Rash    Antimicrobials this admission: Vancomycin, cefepime 10/18 >>    >>   Dose adjustments this admission:   Microbiology results: 10/18 BCx: penfing 10/18 MRSA PCR: pending      10/18 CXR: Possible mild ground-glass infiltrate in the right mid lung. Thank you for allowing pharmacy to be a part of this patient's care.  Laroy Mustard S 01/20/2018 2:51 AM

## 2018-01-20 NOTE — ED Provider Notes (Signed)
[facility] Emergency Department Provider Note  Time seen: I have reviewed the triage vital signs and the nursing notes.   HISTORY  Chief Complaint Respiratory Distress    HPI Rebecca Bird is a 57 y.o. female with below list of chronic medical conditions including CHF and COPD presents to the emergency department with respiratory distress currently on CPAP on arrival.  Patient states progressive dyspnea over the past 3 days.  Patient denies any fever but does admit to cough.  Patient's oxygen saturation 89% on EMS CPAP.  Patient denies any chest pain.   Past Medical History:  Diagnosis Date  . Anemia   . CHF (congestive heart failure) (HCC)   . COPD (chronic obstructive pulmonary disease) (HCC)   . Depression   . Hypertension   . Sleep apnea   . Thyroid disease     Patient Active Problem List   Diagnosis Date Noted  . COPD with acute exacerbation (HCC) 12/27/2017  . HTN (hypertension) 12/27/2017  . Hypothyroidism 12/27/2017  . OSA (obstructive sleep apnea)   . Obesity hypoventilation syndrome (HCC)   . Acute respiratory failure with hypoxia and hypercapnia (HCC)   . Palliative care by specialist   . Goals of care, counseling/discussion   . Acute on chronic respiratory failure with hypoxia and hypercapnia (HCC) 11/16/2017  . Acute on chronic respiratory failure with hypoxemia (HCC) 11/01/2017    History reviewed. No pertinent surgical history.  Prior to Admission medications   Medication Sig Start Date End Date Taking? Authorizing Provider  budesonide (PULMICORT) 0.25 MG/2ML nebulizer solution Take 2 mLs (0.25 mg total) by nebulization 2 (two) times daily. 01/01/18  Yes Altamese Dilling, MD  carvedilol (COREG) 3.125 MG tablet Take 3.125 mg by mouth 2 (two) times daily with a meal.   Yes [provider]  citalopram (CELEXA) 40 MG tablet Take 40 mg by mouth daily.    Yes [provider]  ferrous sulfate 325 (65 FE) MG  tablet Take 325 mg by mouth daily with breakfast.   Yes [provider]  furosemide (LASIX) 40 MG tablet Take 1 tablet (40 mg total) by mouth 2 (two) times daily. 11/25/17  Yes Shaune Pollack, MD  ipratropium-albuterol (DUONEB) 0.5-2.5 (3) MG/3ML SOLN Take 3 mLs by nebulization every 6 (six) hours as needed (shortness of breath). Patient taking differently: Take 3 mLs by nebulization every 6 (six) hours as needed (shortness of breath/ wheezing).  11/05/17  Yes Enid Baas, MD  levothyroxine (SYNTHROID, LEVOTHROID) 100 MCG tablet Take 1 tablet (100 mcg total) by mouth daily before breakfast. 11/06/17  Yes Enid Baas, MD  Turmeric 450 MG CAPS Take 450 mg by mouth daily.   Yes [provider]    Allergies Levaquin [levofloxacin in d5w]  No family history on file.  Social History Social History   Tobacco Use  . Smoking status: Former Games developer  . Smokeless tobacco: Never Used  Substance Use Topics  . Alcohol use: Not on file  . Drug use: Not on file    Review of Systems Constitutional: No fever/chills Eyes: No visual changes. ENT: No sore throat. Cardiovascular: Denies chest pain. Respiratory: Positive for dyspnea Gastrointestinal: No abdominal pain.  No nausea, no vomiting.  No diarrhea.  No constipation. Genitourinary: Negative for dysuria. Musculoskeletal: Negative for neck pain.  Negative for back pain. Integumentary: Negative for rash. Neurological: Negative for headaches, focal weakness or numbness.  ____________________________________________   PHYSICAL EXAM:  VITAL SIGNS: ED Triage Vitals  Enc  Vitals Group     BP 01/20/18 0151 (!) 108/92     Pulse Rate 01/20/18 0046 85     Resp 01/20/18 0046 16     Temp 01/20/18 0046 98.8 F (37.1 C)     Temp Source 01/20/18 0046 Axillary     SpO2 01/20/18 0046 96 %     Weight 01/20/18 0048 (!) 204 kg (449 lb 11.8 oz)     Height 01/20/18 0048 1.651 m (5\' 5" )     Head Circumference --      Peak Flow --        Pain Score 01/20/18 0047 8     Pain Loc --      Pain Edu? --      Excl. in GC? --     Constitutional: Alert and oriented.  Apparent respiratory distress  eyes: Conjunctivae are normal.  Mouth/Throat: Mucous membranes are moist.  Oropharynx non-erythematous. Neck: No stridor.   Cardiovascular: Normal rate, regular rhythm. Good peripheral circulation. Grossly normal heart sounds. Respiratory: Tachypnea, positive accessory respiratory muscle use, diffuse rhonchi worse on the right. Gastrointestinal: Soft and nontender. No distention.  Musculoskeletal: No lower extremity tenderness nor edema. No gross deformities of extremities. Neurologic:  Normal speech and language. No gross focal neurologic deficits are appreciated.  Skin:  Skin is warm, dry and intact. No rash noted. Psychiatric: Mood and affect are normal. Speech and behavior are normal.  ____________________________________________   LABS (all labs ordered are listed, but only abnormal results are displayed)  Labs Reviewed  CBC WITH DIFFERENTIAL/PLATELET - Abnormal; Notable for the following components:      Result Value   MCV 105.6 (*)    MCHC 29.4 (*)    RDW 15.7 (*)    All other components within normal limits  BLOOD GAS, VENOUS - Abnormal; Notable for the following components:   pCO2, Ven 108 (*)    pO2, Ven 50.0 (*)    Bicarbonate 66.9 (*)    Acid-Base Excess 34.0 (*)    All other components within normal limits  CULTURE, BLOOD (ROUTINE X 2)  CULTURE, BLOOD (ROUTINE X 2)  LACTIC ACID, PLASMA  COMPREHENSIVE METABOLIC PANEL  URINALYSIS, ROUTINE W REFLEX MICROSCOPIC  LACTIC ACID, PLASMA   ____________________________________________  EKG  ED ECG REPORT I, Gibson N BROWN, the attending physician, personally viewed and interpreted this ECG.   Date: 01/20/2018  EKG Time: 12:47 AM  Rate: 85  Rhythm: Normal sinus rhythm  Axis: Normal  Intervals: Normal  ST&T Change:  None  ____________________________________________  RADIOLOGY I, Jeff Davis N BROWN, personally viewed and evaluated these images (plain radiographs) as part of my medical decision making, as well as reviewing the written report by the radiologist.  ED MD interpretation: Cardiomegaly with vascular congestion mild perihilar edema.  Possible right middle lung groundglass infiltrate per radiologist.  Official radiology report(s): Dg Chest Port 1 View  Result Date: 01/20/2018 CLINICAL DATA:  Shortness of breath EXAM: PORTABLE CHEST 1 VIEW COMPARISON:  12/30/2017, 12/29/2017, 11/01/2017 FINDINGS: Cardiomegaly with vascular congestion and mild perihilar edema. There may be mild ground-glass opacity adjacent to the right fissure. No pneumothorax. IMPRESSION: 1. Cardiomegaly with vascular congestion and mild perihilar edema 2. Possible mild ground-glass infiltrate in the right mid lung. Electronically Signed   By: Jasmine Pang M.D.   On: 01/20/2018 01:00      Critical Care performed:  .Critical Care Performed by: Darci Current, MD Authorized by: Darci Current, MD   Critical care provider statement:  Critical care time (minutes):  45   Critical care time was exclusive of:  Separately billable procedures and treating other patients and teaching time   Critical care was necessary to treat or prevent imminent or life-threatening deterioration of the following conditions:  Respiratory failure   Critical care was time spent personally by me on the following activities:  Development of treatment plan with patient or surrogate, discussions with consultants, evaluation of patient's response to treatment, examination of patient, obtaining history from patient or surrogate, ordering and performing treatments and interventions, ordering and review of laboratory studies, ordering and review of radiographic studies, pulse oximetry, re-evaluation of patient's condition and review of old charts   I  assumed direction of critical care for this patient from another provider in my specialty: no       ____________________________________________   INITIAL IMPRESSION / ASSESSMENT AND PLAN / ED COURSE  As part of my medical decision making, I reviewed the following data within the electronic MEDICAL RECORD NUMBER   57 year old female presenting with above-stated history and physical exam secondary to respiratory distress.  Suspect acute on chronic rest tori failure with hypoxia and potentially hypercapnia.  BiPAP applied immediately on arrival to the emergency department 2 DuoNeb administered through the BiPAP with improvement of respiratory status following.  Blood gas consistent with hypercapnia with hypoxia.  Patient discussed with Dr. Anne Hahn for hospital admission for further evaluation and management for acute respiratory failure with hypoxia and hypercapnia.    ____________________________________________  FINAL CLINICAL IMPRESSION(S) / ED DIAGNOSES  Final diagnoses:  Acute respiratory failure with hypoxia and hypercapnia (HCC)     MEDICATIONS GIVEN DURING THIS VISIT:  Medications  vancomycin (VANCOCIN) IVPB 1000 mg/200 mL premix (has no administration in time range)  ceFEPIme (MAXIPIME) 2 g in sodium chloride 0.9 % 100 mL IVPB (2 g Intravenous New Bag/Given 01/20/18 0151)  ipratropium-albuterol (DUONEB) 0.5-2.5 (3) MG/3ML nebulizer solution (3 mLs  Given 01/20/18 0052)     ED Discharge Orders    None       Note:  This document was prepared using Dragon voice recognition software and may include unintentional dictation errors.    Darci Current, MD 01/20/18 [phone]

## 2018-01-21 ENCOUNTER — Inpatient Hospital Stay: Payer: Medicare Other

## 2018-01-21 LAB — BLOOD GAS, ARTERIAL
Delivery systems: POSITIVE
EXPIRATORY PAP: 6
FIO2: 0.6
Inspiratory PAP: 16
PCO2 ART: 120 mmHg — AB (ref 32.0–48.0)
PH ART: 7.33 — AB (ref 7.350–7.450)
PO2 ART: 110 mmHg — AB (ref 83.0–108.0)
Patient temperature: 37
RATE: 8 resp/min

## 2018-01-21 LAB — CBC
HCT: 39.5 % (ref 36.0–46.0)
Hemoglobin: 11.7 g/dL — ABNORMAL LOW (ref 12.0–15.0)
MCH: 31.2 pg (ref 26.0–34.0)
MCHC: 29.6 g/dL — ABNORMAL LOW (ref 30.0–36.0)
MCV: 105.3 fL — AB (ref 80.0–100.0)
NRBC: 0 % (ref 0.0–0.2)
PLATELETS: 144 10*3/uL — AB (ref 150–400)
RBC: 3.75 MIL/uL — AB (ref 3.87–5.11)
RDW: 15.9 % — ABNORMAL HIGH (ref 11.5–15.5)
WBC: 4.3 10*3/uL (ref 4.0–10.5)

## 2018-01-21 LAB — BASIC METABOLIC PANEL
ANION GAP: 15 (ref 5–15)
BUN: 23 mg/dL — ABNORMAL HIGH (ref 6–20)
CO2: 48 mmol/L — ABNORMAL HIGH (ref 22–32)
Calcium: 9 mg/dL (ref 8.9–10.3)
Chloride: 81 mmol/L — ABNORMAL LOW (ref 98–111)
Creatinine, Ser: 0.58 mg/dL (ref 0.44–1.00)
GFR calc Af Amer: >60 mL/min (ref >60)
GFR calc non Af Amer: >60 mL/min (ref >60)
GLUCOSE: 107 mg/dL — AB (ref 70–99)
POTASSIUM: 3.2 mmol/L — AB (ref 3.5–5.1)
Sodium: 144 mmol/L (ref 135–145)

## 2018-01-21 MED ORDER — POTASSIUM CHLORIDE CRYS ER 20 MEQ PO TBCR
20.0000 meq | EXTENDED_RELEASE_TABLET | Freq: Two times a day (BID) | ORAL | Status: AC
  Administered 2018-01-21 (×2): 20 meq via ORAL
  Filled 2018-01-21 (×2): qty 1

## 2018-01-21 MED ORDER — BUDESONIDE 0.25 MG/2ML IN SUSP
0.2500 mg | Freq: Two times a day (BID) | RESPIRATORY_TRACT | Status: DC
  Administered 2018-01-21 – 2018-01-22 (×2): 0.25 mg via RESPIRATORY_TRACT
  Filled 2018-01-21 (×2): qty 2

## 2018-01-21 NOTE — Progress Notes (Signed)
   01/21/18 0945  Clinical Encounter Type  Visited With Patient;Patient not available  Visit Type Initial;Spiritual support  Referral From Nurse  Consult/Referral To Chaplain  Spiritual Encounters  Spiritual Needs Prayer  While rounding on the ICU I stopped by to check on Ms. Bacorn. She was asleep but appeared to be restless. I prayed silently outside of her room. I will follow up as needed.

## 2018-01-21 NOTE — Progress Notes (Signed)
[employer] - [place] at [facility]      PATIENT NAME: Rebecca Bird    MR#:  [id number]  DATE OF BIRTH:  03-May-1960  SUBJECTIVE:   Patient here due to worsening shortness of breath and noted to be in acute on chronic respiratory failure with hypercapnia.  Improved, off BiPAP now and plan for transfer to floor from ICU today.   REVIEW OF SYSTEMS:    Review of Systems  Constitutional: Negative for chills and fever.  HENT: Negative for congestion and tinnitus.   Eyes: Negative for blurred vision and double vision.  Respiratory: Negative for cough, shortness of breath and wheezing.   Cardiovascular: Negative for chest pain, orthopnea and PND.  Gastrointestinal: Negative for abdominal pain, diarrhea, nausea and vomiting.  Genitourinary: Negative for dysuria and hematuria.  Neurological: Negative for dizziness, sensory change and focal weakness.  All other systems reviewed and are negative.   Nutrition: Heart Healthy Tolerating Diet: yes Tolerating PT: Await Eval.   DRUG ALLERGIES:   Allergies  Allergen Reactions  . Levaquin [Levofloxacin In D5w] Itching and Rash    VITALS:  Blood pressure (!) 96/39, pulse 88, temperature (!) 100.6 F (38.1 C), temperature source Axillary, resp. rate 18, height 5\' 6"  (1.676 m), weight (!) 199.6 kg, SpO2 94 %.  PHYSICAL EXAMINATION:   Physical Exam  GENERAL:  57 y.o.-year-old morbidly obese patient lying in bed in no acute distress.  EYES: Pupils equal, round, reactive to light and accommodation. No scleral icterus. Extraocular muscles intact.  HEENT: Head atraumatic, normocephalic. Oropharynx and nasopharynx clear.  NECK:  Supple, no jugular venous distention. No thyroid enlargement, no tenderness.  LUNGS: Normal breath sounds bilaterally, no wheezing, rales, rhonchi. No use of accessory muscles of respiration.  CARDIOVASCULAR: S1, S2 normal. No murmurs, rubs, or gallops.  ABDOMEN: Soft, nontender, nondistended. Bowel  sounds present. No organomegaly or mass.  EXTREMITIES: No cyanosis, clubbing, +2 edema b/l.    NEUROLOGIC: Cranial nerves II through XII are intact. No focal Motor or sensory deficits b/l. Globally weak.  PSYCHIATRIC: The patient is alert and oriented x 3.  SKIN: No obvious rash, lesion, or ulcer.    LABORATORY PANEL:   CBC Recent Labs  Lab 01/21/18 0315  WBC 4.3  HGB 11.7*  HCT 39.5  PLT 144*   ------------------------------------------------------------------------------------------------------------------  Chemistries  Recent Labs  Lab 01/20/18 0043 01/21/18 0315  NA 144 144  K 4.2 3.2*  CL 80* 81*  CO2 >50* 48*  GLUCOSE 108* 107*  BUN 20 23*  CREATININE 0.56 0.58  CALCIUM 9.1 9.0  AST 18  --   ALT 22  --   ALKPHOS 85  --   BILITOT 1.1  --    ------------------------------------------------------------------------------------------------------------------  Cardiac Enzymes No results for input(s): TROPONINI in the last 168 hours. ------------------------------------------------------------------------------------------------------------------  RADIOLOGY:  Dg Chest Port 1 View  Result Date: 01/21/2018 CLINICAL DATA:  Acute respiratory failure. EXAM: PORTABLE CHEST 1 VIEW COMPARISON:  One-view chest x-ray 01/20/2018 FINDINGS: The heart is enlarged. Lung volumes are low. Pulmonary vascular congestion and mild edema has slightly increased. Focal airspace opacity in the right midlung is improved. No significant consolidation is present. 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 Advanced degenerative changes are noted at the left shoulder. IMPRESSION: 1. Cardiomegaly with increasing interstitial edema and congestive heart failure. 2. No significant airspace consolidation. Electronically Signed   By: Marin Roberts M.D.   On: 01/21/2018 07:18   Dg Chest Port 1 View  Result Date: 01/20/2018  CLINICAL DATA:  Shortness of breath EXAM: PORTABLE CHEST 1 VIEW  COMPARISON:  12/30/2017, 12/29/2017, 11/01/2017 FINDINGS: Cardiomegaly with vascular congestion and mild perihilar edema. There may be mild ground-glass opacity adjacent to the right fissure. No pneumothorax. IMPRESSION: 1. Cardiomegaly with vascular congestion and mild perihilar edema 2. Possible mild ground-glass infiltrate in the right mid lung. Electronically Signed   By: Jasmine Pang M.D.   On: 01/20/2018 01:00     ASSESSMENT AND PLAN:   57 year old female with past medical history of morbid obesity, hypertension, hyperlipidemia, obstructive sleep apnea, obesity pickwickian syndrome, COPD, CHF, chronic anemia who presents to the hospital due to shortness of breath.  1.  Acute on chronic respiratory failure with hypoxia/hypercapnia-secondary to obesity pickwickian syndrome. - Was initially placed on BiPAP but now weaned off of it and feels better.  Started on Diamox as per intensivist. - Continue PRN duo nebs, Pulmicort nebs.  2.  Obstructive sleep apnea-continue CPAP.  3.  History of chronic diastolic CHF-clinically patient is not in congestive heart failure.  Next line-continue Lasix, carvedilol.  4.  Hypothyroidism-continue Synthroid.  5.  Depression-continue Celexa.  6.  Hypokalemia-secondary patient being on diuretics.  We will continue to replace and repeat level in the morning.  Patient being transferred to the floor today.  Possible discharge to long-term care/skilled nursing facility next 1 to 2 days.  All the records are reviewed and case discussed with Care Management/Social Worker. Management plans discussed with the patient, family and they are in agreement.  CODE STATUS: DNR  DVT Prophylaxis: Lovenox  TOTAL TIME TAKING CARE OF THIS PATIENT: 30 minutes.   POSSIBLE D/C IN 1-2 DAYS, DEPENDING ON CLINICAL CONDITION.   Houston Siren M.D on 01/21/2018 at 4:19 PM  Between 7am to 6pm - Pager - [phone]  After 6pm go to [email or web address] - Chartered loss adjuster [facility]  Office  [phone]  CC: Primary care physician; Dinges, Roanna Banning, NP

## 2018-01-21 NOTE — Progress Notes (Signed)
OT Cancellation Note  Patient Details Name: Rebecca Bird MRN: [id number] DOB: 07/07/1960   Cancelled Treatment:    Reason Eval/Treat Not Completed: Other (comment). Pt with respiratory therapist upon initial attempt this morning to evaluate for OT. Will re-attempt at later date/time as pt is available and medically appropriate.   Richrd Prime, MPH, MS, OTR/L ascom [phone] 01/21/18, 9:41 AM

## 2018-01-21 NOTE — Progress Notes (Signed)
Electrolyte consult  AM K+ 3.2. KCl 20 mEq x  2 doses ordered. Recheck BMP and magnesium with tomorrow AM labs.  Fulton Reek, PharmD, BCPS  01/21/18 5:58 AM

## 2018-01-21 NOTE — Progress Notes (Signed)
RT to patient bedside for scheduled breathing treatment, patient found to be laying half in the bed. Patient's upper half of body was in the bed, lower half of body was off the bed with feet and knees touching the floor. Front desk was paged for assistance helping patient back into bed. Patient denies falling out of bed. States he was just resting in that position. NT to assist placing patient back in bed. RN aware and at bedside.

## 2018-01-22 LAB — BASIC METABOLIC PANEL
Anion gap: 10 (ref 5–15)
BUN: 25 mg/dL — ABNORMAL HIGH (ref 6–20)
CALCIUM: 9.1 mg/dL (ref 8.9–10.3)
CO2: 50 mmol/L — AB (ref 22–32)
CREATININE: 0.58 mg/dL (ref 0.44–1.00)
Chloride: 84 mmol/L — ABNORMAL LOW (ref 98–111)
Glucose, Bld: 101 mg/dL — ABNORMAL HIGH (ref 70–99)
Potassium: 3.6 mmol/L (ref 3.5–5.1)
Sodium: 144 mmol/L (ref 135–145)

## 2018-01-22 LAB — LEGIONELLA PNEUMOPHILA SEROGP 1 UR AG: L. PNEUMOPHILA SEROGP 1 UR AG: NEGATIVE

## 2018-01-22 LAB — MAGNESIUM: Magnesium: 2.2 mg/dL (ref 1.7–2.4)

## 2018-01-22 NOTE — Discharge Summary (Signed)
[employer] - [place] at [facility]   PATIENT NAME: Rebecca Bird    MR#:  [id number]  DATE OF BIRTH:  [birth year]  DATE OF ADMISSION:  01/20/2018 ADMITTING PHYSICIAN: Arnaldo Natal, MD  DATE OF DISCHARGE: 01/22/2018  PRIMARY CARE PHYSICIAN: Dinges, Roanna Banning, NP    ADMISSION DIAGNOSIS:  Acute respiratory failure with hypoxia and hypercapnia (HCC) [J96.01, J96.02]  DISCHARGE DIAGNOSIS:  Active Problems:   Acute on chronic respiratory failure with hypoxemia (HCC)   SECONDARY DIAGNOSIS:   Past Medical History:  Diagnosis Date  . Anemia   . CHF (congestive heart failure) (HCC)   . COPD (chronic obstructive pulmonary disease) (HCC)   . Depression   . Hypertension   . Sleep apnea   . Thyroid disease     HOSPITAL COURSE:   57 year old female with past medical history of morbid obesity, hypertension, hyperlipidemia, obstructive sleep apnea, obesity pickwickian syndrome, COPD, CHF, chronic anemia who presents to the hospital due to shortness of breath.  1.  Acute on chronic respiratory failure with hypoxia/hypercapnia-secondary to obesity pickwickian syndrome. -Patient was initially placed on BiPAP and also given Diamox.  Patient's hypercapnia has improved.  She is more awake and feels less short of breath.  She will continue her BiPAP at the skilled nursing facility. -She will continue her PRN duo nebs  2.  Obstructive sleep apnea- due to her significant morbid obesity. -Patient will continue her BiPAP.  3.  History of chronic diastolic CHF-clinically patient was not in congestive heart failure.   -she will continue Lasix, carvedilol.  4.  Hypothyroidism- She will continue Synthroid.  5.  Depression-she will continue Celexa.  6.  Hypokalemia-secondary patient being on diuretics.   - this has improved and resolved with supplementation.   D/c back to SNF today.   DISCHARGE CONDITIONS:   Stable.   CONSULTS OBTAINED:    DRUG ALLERGIES:    Allergies  Allergen Reactions  . Levaquin [Levofloxacin In D5w] Itching and Rash    DISCHARGE MEDICATIONS:   Allergies as of 01/22/2018      Reactions   Levaquin [levofloxacin In D5w] Itching, Rash      Medication List    TAKE these medications   budesonide 0.25 MG/2ML nebulizer solution Commonly known as:  PULMICORT Take 2 mLs (0.25 mg total) by nebulization 2 (two) times daily.   carvedilol 3.125 MG tablet Commonly known as:  COREG Take 3.125 mg by mouth 2 (two) times daily with a meal.   citalopram 40 MG tablet Commonly known as:  CELEXA Take 40 mg by mouth daily.   ferrous sulfate 325 (65 FE) MG tablet Take 325 mg by mouth daily with breakfast.   furosemide 40 MG tablet Commonly known as:  LASIX Take 1 tablet (40 mg total) by mouth 2 (two) times daily.   ipratropium-albuterol 0.5-2.5 (3) MG/3ML Soln Commonly known as:  DUONEB Take 3 mLs by nebulization every 6 (six) hours as needed (shortness of breath). What changed:  reasons to take this   levothyroxine 100 MCG tablet Commonly known as:  SYNTHROID, LEVOTHROID Take 1 tablet (100 mcg total) by mouth daily before breakfast.   Turmeric 450 MG Caps Take 450 mg by mouth daily.         DISCHARGE INSTRUCTIONS:   DIET:  Cardiac diet  DISCHARGE CONDITION:  Stable  ACTIVITY:  Activity as tolerated  OXYGEN:  Home Oxygen: Yes.     Oxygen Delivery: 2 liters/min via Patient connected to nasal cannula oxygen  DISCHARGE LOCATION:  nursing home   If you experience worsening of your admission symptoms, develop shortness of breath, life threatening emergency, suicidal or homicidal thoughts you must seek medical attention immediately by calling 911 or calling your MD immediately  if symptoms less severe.  You Must read complete instructions/literature along with all the possible adverse reactions/side effects for all the Medicines you take and that have been prescribed to you. Take any new Medicines after  you have completely understood and accpet all the possible adverse reactions/side effects.   Please note  You were cared for by a hospitalist during your hospital stay. If you have any questions about your discharge medications or the care you received while you were in the hospital after you are discharged, you can call the unit and asked to speak with the hospitalist on call if the hospitalist that took care of you is not available. Once you are discharged, your primary care physician will handle any further medical issues. Please note that NO REFILLS for any discharge medications will be authorized once you are discharged, as it is imperative that you return to your primary care physician (or establish a relationship with a primary care physician if you do not have one) for your aftercare needs so that they can reassess your need for medications and monitor your lab values.     Today   No acute events overnight.  Shortness of breath much improved.  Patient says that she can take a deeper breath today.  No evidence of hypercapnia, mental status stable.  Will discharge back to her skilled nursing facility today.  VITAL SIGNS:  Blood pressure 118/69, pulse 79, temperature 98.1 F (36.7 C), temperature source Oral, resp. rate 20, height 5\' 5"  (1.651 m), weight (!) 201.9 kg, SpO2 94 %.  I/O:    Intake/Output Summary (Last 24 hours) at 01/22/2018 1144 Last data filed at 01/22/2018 0900 Gross per 24 hour  Intake 240 ml  Output 1500 ml  Net -1260 ml    PHYSICAL EXAMINATION:   GENERAL:  57 y.o.-year-old morbidly obese patient lying in bed in no acute distress.  EYES: Pupils equal, round, reactive to light and accommodation. No scleral icterus. Extraocular muscles intact.  HEENT: Head atraumatic, normocephalic. Oropharynx and nasopharynx clear.  NECK:  Supple, no jugular venous distention. No thyroid enlargement, no tenderness.  LUNGS: Normal breath sounds bilaterally, no wheezing, rales,  rhonchi. No use of accessory muscles of respiration.  CARDIOVASCULAR: S1, S2 normal. No murmurs, rubs, or gallops.  ABDOMEN: Soft, nontender, nondistended. Bowel sounds present. No organomegaly or mass.  EXTREMITIES: No cyanosis, clubbing, +2 pitting edema b/l.    NEUROLOGIC: Cranial nerves II through XII are intact. No focal Motor or sensory deficits b/l. Globally weak.  PSYCHIATRIC: The patient is alert and oriented x 3.  SKIN: No obvious rash, lesion, or ulcer.   DATA REVIEW:   CBC Recent Labs  Lab 01/21/18 0315  WBC 4.3  HGB 11.7*  HCT 39.5  PLT 144*    Chemistries  Recent Labs  Lab 01/20/18 0043  01/22/18 0426  NA 144   < > 144  K 4.2   < > 3.6  CL 80*   < > 84*  CO2 >50*   < > 50*  GLUCOSE 108*   < > 101*  BUN 20   < > 25*  CREATININE 0.56   < > 0.58  CALCIUM 9.1   < > 9.1  MG  --   --  2.2  AST 18  --   --   ALT 22  --   --   ALKPHOS 85  --   --   BILITOT 1.1  --   --    < > = values in this interval not displayed.    Cardiac Enzymes No results for input(s): TROPONINI in the last 168 hours.  Microbiology Results  Results for orders placed or performed during the hospital encounter of 01/20/18  Blood Culture (routine x 2)     Status: None (Preliminary result)   Collection Time: 01/20/18 12:51 AM  Result Value Ref Range Status   Specimen Description BLOOD LEFT HAND  Final   Special Requests   Final    BOTTLES DRAWN AEROBIC AND ANAEROBIC Blood Culture adequate volume   Culture   Final    NO GROWTH 2 DAYS Performed at [facility], [address] [place], [place] [address]    Report Status PENDING  Incomplete  Blood Culture (routine x 2)     Status: None (Preliminary result)   Collection Time: 01/20/18 12:51 AM  Result Value Ref Range Status   Specimen Description BLOOD LEFT ARM  Final   Special Requests   Final    BOTTLES DRAWN AEROBIC AND ANAEROBIC Blood Culture adequate volume   Culture   Final    NO GROWTH 2 DAYS Performed at [facility], [address] [place], [place] [address]    Report Status PENDING  Incomplete    RADIOLOGY:  Dg Chest Port 1 View  Result Date: 01/21/2018 CLINICAL DATA:  Acute respiratory failure. EXAM: PORTABLE CHEST 1 VIEW COMPARISON:  One-view chest x-ray 01/20/2018 FINDINGS: The heart is enlarged. Lung volumes are low. Pulmonary vascular congestion and mild edema has slightly increased. Focal airspace opacity in the right midlung is improved. No significant consolidation is present. 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 Advanced degenerative changes are noted at the left shoulder. IMPRESSION: 1. Cardiomegaly with increasing interstitial edema and congestive heart failure. 2. No significant airspace consolidation. Electronically Signed   By: Marin Roberts M.D.   On: 01/21/2018 07:18      Management plans discussed with the patient, family and they are in agreement.  CODE STATUS:     Code Status Orders  (From admission, onward)         Start     Ordered   01/20/18 0456  Do not attempt resuscitation (DNR)  Continuous    Question Answer Comment  In the event of cardiac or respiratory ARREST Do not call a "code blue"   In the event of cardiac or respiratory ARREST Do not perform Intubation, CPR, defibrillation or ACLS   In the event of cardiac or respiratory ARREST Use medication by any route, position, wound care, and other measures to relive pain and suffering. May use oxygen, suction and manual treatment of airway obstruction as needed for comfort.   Comments Patient wishes for one cycle of CPR/ACLS. If she is not revived, do not attempt further CPR/ACLS.      01/20/18 0455          TOTAL TIME TAKING CARE OF THIS PATIENT: 40 minutes.    Houston Siren M.D on 01/22/2018 at 11:44 AM  Between 7am to 6pm - Pager - [phone]  After 6pm go to [email or web address] - Scientist, research (life sciences) [facility]  Office   [phone]  CC: Primary care physician; Dinges, Roanna Banning, NP

## 2018-01-22 NOTE — Progress Notes (Signed)
Pharmacy Electrolyte Monitoring Consult:  Pharmacy consulted to assist in monitoring and replacing electrolytes in this 57 y.o. female admitted on 01/20/2018 with Respiratory Distress   Labs:  Sodium (mmol/L)  Date Value  01/22/2018 144   Potassium (mmol/L)  Date Value  01/22/2018 3.6   Magnesium (mg/dL)  Date Value  16/01/9603 2.2   Phosphorus (mg/dL)  Date Value  54/12/8117 3.8   Calcium (mg/dL)  Date Value  14/78/2956 9.1   Albumin (g/dL)  Date Value  21/30/8657 3.6    Assessment/Plan:    Electrolytes WNL. No supplementation is needed at this time.   Recheck electrolytes with am labs.    Pharmacy to follow and adjust as per protocol.    Rebecca Bird K 01/22/2018 7:13 AM

## 2018-01-22 NOTE — Discharge Instructions (Signed)
Acute Respiratory Distress Syndrome, Adult °Acute respiratory distress syndrome is a life-threatening condition in which fluid collects in the lungs. This prevents the lungs from filling with air and passing oxygen into the blood. This can cause the lungs and other vital organs to fail. The condition usually develops following an infection, illness, surgery, or injury. °What are the causes? °This condition may be caused by: °· An infection, such as sepsis or pneumonia. °· A serious injury to the head or chest. °· Severe bleeding from an injury. °· A major surgery. °· Breathing in harmful chemicals or smoke. °· Blood transfusions. °· A blood clot in the lungs. °· Breathing in vomit (aspiration). °· Near-drowning. °· Inflammation of the pancreas (pancreatitis). °· A drug overdose. °What are the signs or symptoms? °Sudden shortness of breath and rapid breathing are the main symptoms of this condition. Other symptoms may include: °· A fast or irregular heartbeat. °· Skin, lips, or fingernails that look blue (cyanosis). °· Confusion. °· Tiredness or loss of energy. °· Chest pain, particularly while taking a breath. °· Coughing. °· Restlessness or anxiety. °· Fever. This is usually present if there is an underlying infection, such as pneumonia. °How is this diagnosed? °This condition is diagnosed based on: °· Your symptoms. °· Medical history. °· A physical exam. During the exam, your health care provider will listen to your heart and check for crackling or wheezing sounds in your lungs. °You may also have other tests to confirm the diagnosis and measure how well your lungs are working. These may include: °· Measuring the amount of oxygen in your blood. Your health care provider will use two methods to do this procedure: °¨ A small device (pulse oximeter) that is placed on your finger, earlobe, or toe. °¨ An arterial blood gas test. A sample of blood is taken from an artery and tested for oxygen levels. °· Blood  tests. °· Chest X-rays or CT scans to look for fluid in the lungs. °· Taking a sample of your sputum to test for infection. °· Heart test, such as an echocardiogram or electrocardiogram. This is done to rule out any heart problems (such as heart failure) that may be causing your symptoms. °· Bronchoscopy. During this test, a thin, flexible tube with a light is passed into the mouth or nose, down the windpipe, and into the lungs. °How is this treated? °Treatment depends on the cause of your condition. The goal is to support you while your lungs heal and the underlying cause is treated. Treatment may include: °· Oxygen therapy. This may be done through: °¨ A tube in your nose or a face mask. °¨ A ventilator. This device helps move air into and out of your lungs through a breathing tube that is inserted into your mouth or nose. °· Continuous positive airway pressure (CPAP). This treatment uses mild air pressure to keep the airways open. A mask or other device will be placed over your nose or mouth. °· Tracheostomy. During this procedure, a small cut is made in your neck to create an opening to your windpipe. A breathing tube is placed directly into your windpipe. The breathing tube is connected to a ventilator. This is done if you have problems with your airway or if you need a ventilator for a long period of time. °· Positioning you to lie on your stomach (prone position). °· Medicines, such as: °¨ Sedatives to help you relax. °¨ Blood pressure medicines. °¨ Antibiotics to treat infection. °¨ Blood thinners   to prevent blood clots. °¨ Diuretics to help prevent excess fluid. °· Fluids and nutrients given through an IV tube. °· Wearing compression stockings on your legs to prevent blood clots. °· Extra corporeal membrane oxygenation (ECMO). This treatment takes blood outside your body, adds oxygen, and removes carbon dioxide. The blood is then returned to your body. This treatment is only used in severe cases. °Follow  these instructions at home: °· Take over-the-counter and prescription medicines only as told by your health care provider. °· Do not use any products that contain nicotine or tobacco, such as cigarettes and e-cigarettes. If you need help quitting, ask your health care provider. °· Limit alcohol intake to no more than 1 drink per day for nonpregnant women and 2 drinks per day for men. One drink equals 12 oz of beer, 5 oz of wine, or 1½ oz of hard liquor. °· Ask friends and family to help you if daily activities make you tired. °· Attend any pulmonary rehabilitation as told by your health care provider. This may include: °¨ Education about your condition. °¨ Exercises. °¨ Breathing training. °¨ Counseling. °¨ Learning techniques to conserve energy. °¨ Nutrition counseling. °· Keep all follow-up visits as told by your health care provider. This is important. °Contact a health care provider if: °· You become short of breath during activity or while resting. °· You develop a cough that does not go away. °· You have a fever. °· Your symptoms do not get better or they get worse. °· You become anxious or depressed. °Get help right away if: °· You have sudden shortness of breath. °· You develop sudden chest pain that does not go away. °· You develop a rapid heart rate. °· You develop swelling or pain in one of your legs. °· You cough up blood. °· You have trouble breathing. °· Your skin, lips, or fingernails turn blue. °These symptoms may represent a serious problem that is an emergency. Do not wait to see if the symptoms will go away. Get medical help right away. Call your local emergency services (911 in the [place] Do not drive yourself to the hospital. °Summary °· Acute respiratory distress syndrome is a life-threatening condition in which fluid collects in the lungs, which leads the lungs and other vital organs to fail. °· This condition usually develops following an infection, illness, surgery, or injury. °· Sudden  shortness of breath and rapid breathing are the main symptoms of acute respiratory distress syndrome. °· Treatment may include oxygen therapy, continuous positive airway pressure (CPAP), tracheostomy, lying on your stomach (prone position), medicines, fluids and nutrients given through an IV tube, compression stockings, and extra corporeal membrane oxygenation (ECMO). °This information is not intended to replace advice given to you by your health care provider. Make sure you discuss any questions you have with your health care provider. °Document Released: 03/22/2005 Document Revised: 03/08/2016 Document Reviewed: 03/08/2016 °Elsevier Interactive Patient Education © 2017 Elsevier Inc. ° °

## 2018-01-22 NOTE — Progress Notes (Signed)
Called report to [facility] at this time. All questions answered. Rebecca Bird  Called EMS for non-emergent patient transport to the facility at this time. Awaiting arrival. Patient already dressed. [name] [facility]

## 2018-01-22 NOTE — NC FL2 (Signed)
[place] MEDICAID FL2 LEVEL OF CARE SCREENING TOOL     IDENTIFICATION  Patient Name: Rebecca Bird Birthdate: 1960/06/24 Sex: female Admission Date (Current Location): 01/20/2018  [place] and [place] Number:  Chiropodist and Address:  [facility], [address] [place], [place] [address]      Provider Number: [id number]  Attending Physician Name and Address:  Houston Siren, MD  Relative Name and Phone Number:  Dorothey Baseman (Daughter) [phone], Tyson Alias [facility]) [phone], Berkley Harvey (Relative) [phone]    Current Level of Care: Hospital Recommended Level of Care: Skilled Nursing Facility Prior Approval Number:    Date Approved/Denied:   PASRR Number: [id number]  Discharge Plan: SNF    Current Diagnoses: Patient Active Problem List   Diagnosis Date Noted  . COPD with acute exacerbation (HCC) 12/27/2017  . HTN (hypertension) 12/27/2017  . Hypothyroidism 12/27/2017  . OSA (obstructive sleep apnea)   . Obesity hypoventilation syndrome (HCC)   . Acute respiratory failure with hypoxia and hypercapnia (HCC)   . Palliative care by specialist   . Goals of care, counseling/discussion   . Acute on chronic respiratory failure with hypoxia and hypercapnia (HCC) 11/16/2017  . Acute on chronic respiratory failure with hypoxemia (HCC) 11/01/2017    Orientation RESPIRATION BLADDER Height & Weight     Self, Time, Situation, Place  O2(o2 3L) Incontinent Weight: (!) 445 lb (201.9 kg) Height:  5\' 5"  (165.1 cm)  BEHAVIORAL SYMPTOMS/MOOD NEUROLOGICAL BOWEL NUTRITION STATUS      Continent Diet(Heart healthy)  AMBULATORY STATUS COMMUNICATION OF NEEDS Skin   Extensive Assist Verbally Normal                       Personal Care Assistance Level of Assistance  Bathing, Feeding, Dressing Bathing Assistance: Maximum assistance Feeding assistance: Independent Dressing Assistance: Maximum assistance     Functional  Limitations Info    Sight Info: Adequate Hearing Info: Adequate Speech Info: Adequate    SPECIAL CARE FACTORS FREQUENCY  PT (By licensed PT)     PT Frequency: Up to 5X per week              Contractures Contractures Info: Not present    Additional Factors Info  Code Status, Allergies, Psychotropic Code Status Info: DNR Allergies Info:  Levaquin Levofloxacin In D5w Psychotropic Info: Celexa         Current Medications (01/22/2018):  This is the current hospital active medication list Current Facility-Administered Medications  Medication Dose Route Frequency Provider Last Rate Last Dose  . acetaminophen (TYLENOL) tablet 650 mg  650 mg Oral Q6H PRN Arnaldo Natal, MD   650 mg at 01/21/18 2302   Or  . acetaminophen (TYLENOL) suppository 650 mg  650 mg Rectal Q6H PRN Arnaldo Natal, MD      . budesonide (PULMICORT) nebulizer solution 0.25 mg  0.25 mg Nebulization BID Houston Siren, MD   0.25 mg at 01/22/18 0757  . carvedilol (COREG) tablet 3.125 mg  3.125 mg Oral BID WC Arnaldo Natal, MD   3.125 mg at 01/22/18 7253  . citalopram (CELEXA) tablet 40 mg  40 mg Oral Daily Arnaldo Natal, MD   40 mg at 01/22/18 0932  . docusate sodium (COLACE) capsule 100 mg  100 mg Oral BID Arnaldo Natal, MD   100 mg at 01/22/18 0932  . enoxaparin (LOVENOX) injection 40 mg  40 mg Subcutaneous BID Arnaldo Natal, MD  40 mg at 01/22/18 0932  . ferrous sulfate tablet 325 mg  325 mg Oral Q breakfast Arnaldo Natal, MD   325 mg at 01/22/18 6213  . furosemide (LASIX) tablet 40 mg  40 mg Oral BID Arnaldo Natal, MD   40 mg at 01/22/18 0865  . ipratropium-albuterol (DUONEB) 0.5-2.5 (3) MG/3ML nebulizer solution 3 mL  3 mL Nebulization Q4H PRN Harlon Ditty D, NP      . levothyroxine (SYNTHROID, LEVOTHROID) tablet 100 mcg  100 mcg Oral QAC breakfast Arnaldo Natal, MD   100 mcg at 01/22/18 7846  . medroxyPROGESTERone (PROVERA) tablet 10 mg  10 mg Oral Daily Roseanne Reno, MD   10 mg at 01/22/18 0932  . ondansetron (ZOFRAN) tablet 4 mg  4 mg Oral Q6H PRN Arnaldo Natal, MD       Or  . ondansetron [facility]) injection 4 mg  4 mg Intravenous Q6H PRN Arnaldo Natal, MD         Discharge Medications: Please see discharge summary for a list of discharge medications.  Relevant Imaging Results:  Relevant Lab Results:   Additional Information SS#[id number]  Judi Cong, LCSW

## 2018-01-22 NOTE — Clinical Social Work Note (Signed)
Clinical Social Work Assessment  Patient Details  Name: Rebecca Bird MRN: [id number] Date of Birth: 25-May-1960  Date of referral:  01/22/18               Reason for consult:  Facility Placement                Permission sought to share information with:  Chartered certified accountant granted to share information::  Yes, Verbal Permission Granted  Name::        Agency::  [facility]  Relationship::     Contact Information:     Housing/Transportation Living arrangements for the past 2 months:  [name] of Information:  Patient Patient Interpreter Needed:  None Criminal Activity/Legal Involvement Pertinent to Current Situation/Hospitalization:  No - Comment as needed Significant Relationships:  Warehouse manager, Adult Children, Other Family Members Lives with:  Facility Resident Do you feel safe going back to the place where you live?  Yes Need for family participation in patient care:  No (Coment)  Care giving concerns:  Patient admitted from [name] Worker assessment / plan:  The CSW met with the patient at bedside on day of discharge to discuss plan for transition back to the patient's facility. The patient verbalized agreement with the plan. The patient will discharge today to [name] to continue long term care. Claiborne Billings, the admissions coordinator, is aware and in agreement. The patient will be transported by non-emergent EMS. The CSW will deliver the discharge packet as soon as possible and has sent all documentation to the facility. The CSW is signing off. Please consult should needs arise.  Employment status:  Retired Forensic scientist:  Medicare PT Recommendations:  [place] / Referral to community resources:     Patient/Family's Response to care: The patient thanked the CSW.  Patient/Family's Understanding of and Emotional Response to Diagnosis,  Current Treatment, and Prognosis:  The patient understands and agrees with the discharge plan.  Emotional Assessment Appearance:  Appears stated age Attitude/Demeanor/Rapport:  Gracious, Engaged Affect (typically observed):  Pleasant, Appropriate Orientation:  Oriented to Self, Oriented to Place, Oriented to  Time, Oriented to Situation Alcohol / Substance use:  Never Used Psych involvement (Current and /or in the community):  No (Comment)  Discharge Needs  Concerns to be addressed:  Care Coordination, Discharge Planning Concerns Readmission within the last 30 days:  Yes Current discharge risk:  Chronically ill Barriers to Discharge:  No Barriers Identified   Zettie Pho, LCSW 01/22/2018, 12:10 PM

## 2018-01-25 LAB — CULTURE, BLOOD (ROUTINE X 2)
CULTURE: NO GROWTH
Culture: NO GROWTH
SPECIAL REQUESTS: ADEQUATE
Special Requests: ADEQUATE

## 2018-02-05 ENCOUNTER — Emergency Department: Payer: Medicare Other

## 2018-02-05 ENCOUNTER — Other Ambulatory Visit: Payer: Self-pay

## 2018-02-05 ENCOUNTER — Observation Stay
Admission: EM | Admit: 2018-02-05 | Discharge: 2018-02-06 | Disposition: A | Discharge status: Skilled Nursing Facility | Payer: Medicare Other | Attending: Internal Medicine | Admitting: Internal Medicine

## 2018-02-05 DIAGNOSIS — I10 Essential (primary) hypertension: Secondary | ICD-10-CM | POA: Diagnosis present

## 2018-02-05 DIAGNOSIS — G4733 Obstructive sleep apnea (adult) (pediatric): Secondary | ICD-10-CM | POA: Diagnosis present

## 2018-02-05 DIAGNOSIS — D638 Anemia in other chronic diseases classified elsewhere: Secondary | ICD-10-CM | POA: Diagnosis not present

## 2018-02-05 DIAGNOSIS — J9621 Acute and chronic respiratory failure with hypoxia: Principal | ICD-10-CM | POA: Diagnosis present

## 2018-02-05 DIAGNOSIS — J9622 Acute and chronic respiratory failure with hypercapnia: Secondary | ICD-10-CM | POA: Insufficient documentation

## 2018-02-05 DIAGNOSIS — Z7951 Long term (current) use of inhaled steroids: Secondary | ICD-10-CM | POA: Insufficient documentation

## 2018-02-05 DIAGNOSIS — Z79899 Other long term (current) drug therapy: Secondary | ICD-10-CM | POA: Diagnosis not present

## 2018-02-05 DIAGNOSIS — Z79890 Hormone replacement therapy (postmenopausal): Secondary | ICD-10-CM | POA: Insufficient documentation

## 2018-02-05 DIAGNOSIS — E662 Morbid (severe) obesity with alveolar hypoventilation: Secondary | ICD-10-CM | POA: Diagnosis present

## 2018-02-05 DIAGNOSIS — F329 Major depressive disorder, single episode, unspecified: Secondary | ICD-10-CM | POA: Diagnosis not present

## 2018-02-05 DIAGNOSIS — E039 Hypothyroidism, unspecified: Secondary | ICD-10-CM | POA: Diagnosis not present

## 2018-02-05 DIAGNOSIS — Z66 Do not resuscitate: Secondary | ICD-10-CM | POA: Insufficient documentation

## 2018-02-05 DIAGNOSIS — J441 Chronic obstructive pulmonary disease with (acute) exacerbation: Secondary | ICD-10-CM | POA: Diagnosis present

## 2018-02-05 DIAGNOSIS — I110 Hypertensive heart disease with heart failure: Secondary | ICD-10-CM | POA: Insufficient documentation

## 2018-02-05 DIAGNOSIS — I5032 Chronic diastolic (congestive) heart failure: Secondary | ICD-10-CM | POA: Diagnosis not present

## 2018-02-05 DIAGNOSIS — Z6841 Body Mass Index (BMI) 40.0 and over, adult: Secondary | ICD-10-CM | POA: Insufficient documentation

## 2018-02-05 DIAGNOSIS — Z87891 Personal history of nicotine dependence: Secondary | ICD-10-CM | POA: Insufficient documentation

## 2018-02-05 DIAGNOSIS — R0603 Acute respiratory distress: Secondary | ICD-10-CM | POA: Diagnosis present

## 2018-02-05 DIAGNOSIS — J9601 Acute respiratory failure with hypoxia: Secondary | ICD-10-CM | POA: Diagnosis present

## 2018-02-05 DIAGNOSIS — R0902 Hypoxemia: Secondary | ICD-10-CM

## 2018-02-05 HISTORY — DX: Morbid (severe) obesity with alveolar hypoventilation: E66.2

## 2018-02-05 LAB — CBC WITH DIFFERENTIAL/PLATELET
Abs Immature Granulocytes: 0.18 10*3/uL — ABNORMAL HIGH (ref 0.00–0.07)
BASOS PCT: 1 %
Basophils Absolute: 0.1 10*3/uL (ref 0.0–0.1)
EOS ABS: 0.1 10*3/uL (ref 0.0–0.5)
EOS PCT: 1 %
HEMATOCRIT: 45.3 % (ref 36.0–46.0)
Hemoglobin: 12.9 g/dL (ref 12.0–15.0)
IMMATURE GRANULOCYTES: 2 %
Lymphocytes Relative: 14 %
Lymphs Abs: 1.1 10*3/uL (ref 0.7–4.0)
MCH: 30.9 pg (ref 26.0–34.0)
MCHC: 28.5 g/dL — AB (ref 30.0–36.0)
MCV: 108.4 fL — ABNORMAL HIGH (ref 80.0–100.0)
MONO ABS: 0.6 10*3/uL (ref 0.1–1.0)
MONOS PCT: 8 %
NEUTROS PCT: 74 %
Neutro Abs: 6.1 10*3/uL (ref 1.7–7.7)
PLATELETS: 201 10*3/uL (ref 150–400)
RBC: 4.18 MIL/uL (ref 3.87–5.11)
RDW: 15.9 % — AB (ref 11.5–15.5)
WBC: 8.1 10*3/uL (ref 4.0–10.5)
nRBC: 0.6 % — ABNORMAL HIGH (ref 0.0–0.2)

## 2018-02-05 LAB — BASIC METABOLIC PANEL
Anion gap: 11 (ref 5–15)
BUN: 22 mg/dL — AB (ref 6–20)
CO2: 49 mmol/L — AB (ref 22–32)
Calcium: 8.9 mg/dL (ref 8.9–10.3)
Chloride: 82 mmol/L — ABNORMAL LOW (ref 98–111)
Creatinine, Ser: 0.71 mg/dL (ref 0.44–1.00)
GFR calc Af Amer: >60 mL/min (ref >60)
GFR calc non Af Amer: >60 mL/min (ref >60)
GLUCOSE: 134 mg/dL — AB (ref 70–99)
Potassium: 4.3 mmol/L (ref 3.5–5.1)
Sodium: 142 mmol/L (ref 135–145)

## 2018-02-05 MED ORDER — ONDANSETRON HCL 4 MG PO TABS: 4.0000 mg | ORAL_TABLET | Freq: Four times a day (QID) | ORAL | Status: DC | PRN

## 2018-02-05 MED ORDER — LEVOTHYROXINE SODIUM 100 MCG PO TABS
100.0000 ug | ORAL_TABLET | Freq: Every day | ORAL | Status: DC
  Administered 2018-02-06: 09:00:00 100 ug via ORAL
  Filled 2018-02-05: qty 1

## 2018-02-05 MED ORDER — BUDESONIDE 0.25 MG/2ML IN SUSP
0.2500 mg | Freq: Two times a day (BID) | RESPIRATORY_TRACT | Status: DC
  Administered 2018-02-06: 0.25 mg via RESPIRATORY_TRACT
  Filled 2018-02-05: qty 2

## 2018-02-05 MED ORDER — CARVEDILOL 3.125 MG PO TABS
3.1250 mg | ORAL_TABLET | Freq: Two times a day (BID) | ORAL | Status: DC
  Administered 2018-02-06: 09:00:00 3.125 mg via ORAL
  Filled 2018-02-05: qty 1

## 2018-02-05 MED ORDER — ACETAMINOPHEN 650 MG RE SUPP: 650.0000 mg | Freq: Four times a day (QID) | RECTAL | Status: DC | PRN

## 2018-02-05 MED ORDER — ACETAZOLAMIDE SODIUM 500 MG IJ SOLR
250.0000 mg | Freq: Once | INTRAMUSCULAR | Status: AC
  Administered 2018-02-05: 250 mg via INTRAVENOUS
  Filled 2018-02-05: qty 250

## 2018-02-05 MED ORDER — FUROSEMIDE 40 MG PO TABS
40.0000 mg | ORAL_TABLET | Freq: Two times a day (BID) | ORAL | Status: DC
  Administered 2018-02-06: 40 mg via ORAL
  Filled 2018-02-05: qty 1

## 2018-02-05 MED ORDER — ONDANSETRON HCL 4 MG/2ML IJ SOLN: 4.0000 mg | Freq: Four times a day (QID) | INTRAMUSCULAR | Status: DC | PRN

## 2018-02-05 MED ORDER — ACETAMINOPHEN 325 MG PO TABS: 650.0000 mg | ORAL_TABLET | Freq: Four times a day (QID) | ORAL | Status: DC | PRN

## 2018-02-05 MED ORDER — METHYLPREDNISOLONE SODIUM SUCC 125 MG IJ SOLR
60.0000 mg | Freq: Four times a day (QID) | INTRAMUSCULAR | Status: DC
  Administered 2018-02-06 (×2): 60 mg via INTRAVENOUS
  Filled 2018-02-05 (×2): qty 2

## 2018-02-05 MED ORDER — ACETAZOLAMIDE SODIUM 500 MG IJ SOLR: 500.0000 mg | Freq: Once | INTRAMUSCULAR | Status: DC

## 2018-02-05 MED ORDER — IPRATROPIUM-ALBUTEROL 0.5-2.5 (3) MG/3ML IN SOLN
3.0000 mL | RESPIRATORY_TRACT | Status: DC | PRN
  Administered 2018-02-06: 3 mL via RESPIRATORY_TRACT
  Filled 2018-02-05: qty 3

## 2018-02-05 MED ORDER — IPRATROPIUM-ALBUTEROL 0.5-2.5 (3) MG/3ML IN SOLN
3.0000 mL | Freq: Once | RESPIRATORY_TRACT | Status: AC
  Administered 2018-02-05: 3 mL via RESPIRATORY_TRACT
  Filled 2018-02-05: qty 3

## 2018-02-05 MED ORDER — METHYLPREDNISOLONE SODIUM SUCC 125 MG IJ SOLR
125.0000 mg | Freq: Once | INTRAMUSCULAR | Status: AC
  Administered 2018-02-05: 125 mg via INTRAVENOUS
  Filled 2018-02-05: qty 2

## 2018-02-05 MED ORDER — ENOXAPARIN SODIUM 40 MG/0.4ML ~~LOC~~ SOLN
40.0000 mg | Freq: Two times a day (BID) | SUBCUTANEOUS | Status: DC
  Administered 2018-02-06: 40 mg via SUBCUTANEOUS
  Filled 2018-02-05: qty 0.4

## 2018-02-05 MED ORDER — CITALOPRAM HYDROBROMIDE 20 MG PO TABS
40.0000 mg | ORAL_TABLET | Freq: Every day | ORAL | Status: DC
  Administered 2018-02-06: 40 mg via ORAL
  Filled 2018-02-05: qty 2

## 2018-02-05 NOTE — H&P (Signed)
[facility] [place] at [facility]   PATIENT NAME: Rebecca Bird    MR#:  [id number]  DATE OF BIRTH:  1960-12-29  DATE OF ADMISSION:  02/05/2018  PRIMARY CARE PHYSICIAN: Dimple Casey, MD   REQUESTING/REFERRING PHYSICIAN: Derrill Kay, MD  CHIEF COMPLAINT:   Chief Complaint  Patient presents with  . Respiratory Distress    HISTORY OF PRESENT ILLNESS:  Rebecca Bird  is a 57 y.o. female who presents with chief complaint as above.  Patient presents with episode of severe hypoxia.  She is well-known to this hospital and presents frequently for the same problem.  She has COPD, OHS.  She states that today she became short of breath and had some wheezing and oxygen saturation measured at the facility was in the 60s.  She was sent to the ED emergently.  She was initially placed on BiPAP and given Solu-Medrol and duo nebs.  Her breathing stabilized and she was able to wean off the BiPAP.  Hospitalist were called for admission  PAST MEDICAL HISTORY:   Past Medical History:  Diagnosis Date  . Anemia   . CHF (congestive heart failure) (HCC)   . COPD (chronic obstructive pulmonary disease) (HCC)   . Depression   . Hypertension   . Obesity hypoventilation syndrome (HCC)   . Sleep apnea   . Thyroid disease      PAST SURGICAL HISTORY:   Past Surgical History:  Procedure Laterality Date  . ROTATOR CUFF REPAIR       SOCIAL HISTORY:   Social History   Tobacco Use  . Smoking status: Former Smoker    Years: 10.00  . Smokeless tobacco: Never Used  Substance Use Topics  . Alcohol use: Not Currently     FAMILY HISTORY:   Family History  Problem Relation Age of Onset  . Hypertension Mother      DRUG ALLERGIES:   Allergies  Allergen Reactions  . Levaquin [Levofloxacin In D5w] Itching and Rash    MEDICATIONS AT HOME:   Prior to Admission medications   Medication Sig Start Date End Date Taking? Authorizing Provider  budesonide (PULMICORT) 0.25  MG/2ML nebulizer solution Take 2 mLs (0.25 mg total) by nebulization 2 (two) times daily. 01/01/18   Altamese Dilling, MD  carvedilol (COREG) 3.125 MG tablet Take 3.125 mg by mouth 2 (two) times daily with a meal.    [provider]  citalopram (CELEXA) 40 MG tablet Take 40 mg by mouth daily.     [provider]  ferrous sulfate 325 (65 FE) MG tablet Take 325 mg by mouth daily with breakfast.    [provider]  furosemide (LASIX) 40 MG tablet Take 1 tablet (40 mg total) by mouth 2 (two) times daily. 11/25/17   Shaune Pollack, MD  ipratropium-albuterol (DUONEB) 0.5-2.5 (3) MG/3ML SOLN Take 3 mLs by nebulization every 6 (six) hours as needed (shortness of breath). Patient taking differently: Take 3 mLs by nebulization every 6 (six) hours as needed (shortness of breath/ wheezing).  11/05/17   Enid Baas, MD  levothyroxine (SYNTHROID, LEVOTHROID) 100 MCG tablet Take 1 tablet (100 mcg total) by mouth daily before breakfast. 11/06/17   Enid Baas, MD  Turmeric 450 MG CAPS Take 450 mg by mouth daily.    [provider]    REVIEW OF SYSTEMS:  Review of Systems  Constitutional: Negative for chills, fever, malaise/fatigue and weight loss.  HENT: Negative for ear pain, hearing loss and tinnitus.   Eyes: Negative  for blurred vision, double vision, pain and redness.  Respiratory: Positive for shortness of breath and wheezing. Negative for cough and hemoptysis.   Cardiovascular: Negative for chest pain, palpitations, orthopnea and leg swelling.  Gastrointestinal: Negative for abdominal pain, constipation, diarrhea, nausea and vomiting.  Genitourinary: Negative for dysuria, frequency and hematuria.  Musculoskeletal: Negative for back pain, joint pain and neck pain.  Skin:       No acne, rash, or lesions  Neurological: Negative for dizziness, tremors, focal weakness and weakness.  Endo/Heme/Allergies: Negative for polydipsia. Does not bruise/bleed easily.   Psychiatric/Behavioral: Negative for depression. The patient is not nervous/anxious and does not have insomnia.      VITAL SIGNS:   Vitals:   02/05/18 1730 02/05/18 1900 02/05/18 1953 02/05/18 2029  BP: 121/89 106/68 113/86 131/83  Pulse: 84 77 84 92  Resp:  16 12 19   Temp:      TempSrc:      SpO2: (!) 80% 96% 96% 95%  Weight:      Height:       Wt Readings from Last 3 Encounters:  02/05/18 (!) 201.9 kg  01/22/18 (!) 201.9 kg  12/28/17 (!) 204.7 kg    PHYSICAL EXAMINATION:  Physical Exam  Vitals reviewed. Constitutional: She is oriented to person, place, and time. She appears well-developed and well-nourished. No distress.  HENT:  Head: Normocephalic and atraumatic.  Mouth/Throat: Oropharynx is clear and moist.  Eyes: Pupils are equal, round, and reactive to light. Conjunctivae and EOM are normal. No scleral icterus.  Neck: Normal range of motion. Neck supple. No JVD present. No thyromegaly present.  Cardiovascular: Normal rate, regular rhythm and intact distal pulses. Exam reveals no gallop and no friction rub.  No murmur heard. Respiratory: Effort normal. No respiratory distress. She has no wheezes. She has no rales.  Currently no audible wheezes, diminished breath sounds, decent air movement though less on the right  GI: Soft. Bowel sounds are normal. She exhibits no distension. There is no tenderness.  Musculoskeletal: Normal range of motion. She exhibits no edema.  No arthritis, no gout  Lymphadenopathy:    She has no cervical adenopathy.  Neurological: She is alert and oriented to person, place, and time. No cranial nerve deficit.  No dysarthria, no aphasia  Skin: Skin is warm and dry. No rash noted. No erythema.  Psychiatric: She has a normal mood and affect. Her behavior is normal. Judgment and thought content normal.    LABORATORY PANEL:   CBC Recent Labs  Lab 02/05/18 1528  WBC 8.1  HGB 12.9  HCT 45.3  PLT 201    ------------------------------------------------------------------------------------------------------------------  Chemistries  Recent Labs  Lab 02/05/18 1528  NA 142  K 4.3  CL 82*  CO2 49*  GLUCOSE 134*  BUN 22*  CREATININE 0.71  CALCIUM 8.9   ------------------------------------------------------------------------------------------------------------------  Cardiac Enzymes No results for input(s): TROPONINI in the last 168 hours. ------------------------------------------------------------------------------------------------------------------  RADIOLOGY:  Dg Chest Portable 1 View  Result Date: 02/05/2018 CLINICAL DATA:  Pt arrived from [facility] with resp distress with O2 sats in the 60's at the faciliity. Pt is normally on 3L. Pt arrived here with sats in the low 80's. Pt has hx of right shoulder dislocation. Pt NAD but is obese. Hx -.*comment was truncated*resp distress EXAM: PORTABLE CHEST 1 VIEW COMPARISON:  Radiograph 01/21/2018 FINDINGS: Exam is lordotic. Stable enlarged cardiac silhouette. Lung bases poorly evaluated. Central venous congestion noted. No overt pulmonary edema. No infiltrate identified. IMPRESSION: Somewhat limited exam.  Cardiomegaly and central venous congestion. Electronically Signed   By: Genevive Bi M.D.   On: 02/05/2018 15:43    EKG:   Orders placed or performed during the hospital encounter of 01/20/18  . ED EKG 12-Lead  . ED EKG 12-Lead  . EKG 12-Lead  . EKG 12-Lead    IMPRESSION AND PLAN:  Principal Problem:   Acute on chronic respiratory failure with hypoxemia (HCC) -stabilized initially on BiPAP, but now weaned to nasal cannula with good oxygen saturations.  Wheezing has improved after Solu-Medrol and DuoNeb treatments.  Patient is on BiPAP nightly for OSA, we will continue this here Active Problems:   Obesity hypoventilation syndrome (HCC) -continue home meds, she received a dose of Diamox in the ED.  BiPAP nightly as  above   COPD with acute exacerbation (HCC) -IV Solu-Medrol, duo nebs, BiPAP nightly other supportive treatment PRN   OSA (obstructive sleep apnea) -BiPAP nightly as above   HTN (hypertension) -home dose antihypertensives   Hypothyroidism -home dose thyroid replacement  Chart review performed and case discussed with ED provider. Labs, imaging and/or ECG reviewed by provider and discussed with patient/family. Management plans discussed with the patient and/or family.  DVT PROPHYLAXIS: SubQ lovenox   GI PROPHYLAXIS:  None  ADMISSION STATUS: Inpatient     CODE STATUS: DNR Code Status History    Date Active Date Inactive Code Status Order ID Comments User Context   01/20/2018 0455 01/22/2018 1915 DNR [id number]  Arnaldo Natal, MD Inpatient   12/28/2017 0125 01/01/2018 2044 DNR [id number]  Oralia Manis, MD ED   11/21/2017 1408 11/25/2017 2055 Partial Code [id number]  Alita Chyle, NP Inpatient   11/16/2017 0328 11/21/2017 1408 Full Code [id number]  Arnaldo Natal, MD Inpatient   11/01/2017 0850 11/05/2017 1903 Full Code [id number]  Arnaldo Natal, MD Inpatient    Questions for Most Recent Historical Code Status (Order [id number])    Question Answer Comment   In the event of cardiac or respiratory ARREST Do not call a "code blue"    In the event of cardiac or respiratory ARREST Do not perform Intubation, CPR, defibrillation or ACLS    In the event of cardiac or respiratory ARREST Use medication by any route, position, wound care, and other measures to relive pain and suffering. May use oxygen, suction and manual treatment of airway obstruction as needed for comfort.    Comments Patient wishes for one cycle of CPR/ACLS. If she is not revived, do not attempt further CPR/ACLS.       TOTAL TIME TAKING CARE OF THIS PATIENT: 45 minutes.   Chinelo Benn FIELDING 02/05/2018, 8:50 PM  [facility]  Office  [phone]  CC: Primary care physician; Dimple Casey, MD  Note:   This document was prepared using Dragon voice recognition software and may include unintentional dictation errors.

## 2018-02-05 NOTE — ED Triage Notes (Signed)
Pt arrived from [facility] with resp distress with O2 sats in the 60's at the faciliity. Pt is normally on 3L. Pt arrived here with sats in the low 80's. Pt has hx of right shoulder dislocation. Pt NAD but is obese.

## 2018-02-05 NOTE — ED Notes (Signed)
ED TO INPATIENT HANDOFF REPORT  Name/Age/Gender Rebecca Bird 57 y.o. female  Code Status Code Status History    Date Active Date Inactive Code Status Order ID Comments User Context   01/20/2018 0455 01/22/2018 1915 DNR [id number]  Harrie Foreman, MD Inpatient   12/28/2017 0125 01/01/2018 2044 DNR [id number]  Lance Coon, MD ED   11/21/2017 1408 11/25/2017 2055 Partial Code [id number]  Basilio Cairo, NP Inpatient   11/16/2017 0328 11/21/2017 1408 Full Code [id number]  Harrie Foreman, MD Inpatient   11/01/2017 0850 11/05/2017 1903 Full Code [id number]  Harrie Foreman, MD Inpatient    Questions for Most Recent Historical Code Status (Order [id number])    Question Answer Comment   In the event of cardiac or respiratory ARREST Do not call a "code blue"    In the event of cardiac or respiratory ARREST Do not perform Intubation, CPR, defibrillation or ACLS    In the event of cardiac or respiratory ARREST Use medication by any route, position, wound care, and other measures to relive pain and suffering. May use oxygen, suction and manual treatment of airway obstruction as needed for comfort.    Comments Patient wishes for one cycle of CPR/ACLS. If she is not revived, do not attempt further CPR/ACLS.       Home/SNF/Other Skilled nursing facility  Chief Complaint SOB; Shoulder Pain  Level of Care/Admitting Diagnosis ED Disposition    ED Disposition Condition [facility]: [place] [[id number]]  Level of Care: Med-Surg [16]  Diagnosis: Acute respiratory failure with hypoxia [facility]) [[id number]]  Admitting Physician: Lance Coon [[id number]]  Attending Physician: Lance Coon [id number]  Estimated length of stay: past midnight tomorrow  Certification:: I certify this patient will need inpatient services for at least 2 midnights  PT Class (Do Not Modify): Inpatient [101]  PT Acc Code (Do Not Modify): Private [1]       Medical History Past Medical  History:  Diagnosis Date  . Anemia   . CHF (congestive heart failure) ([place])   . COPD (chronic obstructive pulmonary disease) ([place])   . Depression   . Hypertension   . Obesity hypoventilation syndrome ([place])   . Sleep apnea   . Thyroid disease     Allergies Allergies  Allergen Reactions  . Levaquin [Levofloxacin In D5w] Itching and Rash    IV Location/Drains/Wounds Patient Lines/Drains/Airways Status   Active Line/Drains/Airways    Name:   Placement date:   Placement time:   Site:   Days:   Peripheral IV 02/05/18 Left Hand   02/05/18    1616    Hand   less than 1   External Urinary Catheter   01/20/18    0520    -   16          Labs/Imaging Results for orders placed or performed during the hospital encounter of 02/05/18 (from the past 48 hour(s))  CBC with Differential     Status: Abnormal   Collection Time: 02/05/18  3:28 PM  Result Value Ref Range   WBC 8.1 4.0 - 10.5 K/uL   RBC 4.18 3.87 - 5.11 MIL/uL   Hemoglobin 12.9 12.0 - 15.0 g/dL   HCT 45.3 36.0 - 46.0 %   MCV 108.4 (H) 80.0 - 100.0 fL   MCH 30.9 26.0 - 34.0 pg   MCHC 28.5 (L) 30.0 - 36.0 g/dL   RDW 15.9 (H) 11.5 - 15.5 %   Platelets 201 150 -  400 K/uL   nRBC 0.6 (H) 0.0 - 0.2 %   Neutrophils Relative % 74 %   Neutro Abs 6.1 1.7 - 7.7 K/uL   Lymphocytes Relative 14 %   Lymphs Abs 1.1 0.7 - 4.0 K/uL   Monocytes Relative 8 %   Monocytes Absolute 0.6 0.1 - 1.0 K/uL   Eosinophils Relative 1 %   Eosinophils Absolute 0.1 0.0 - 0.5 K/uL   Basophils Relative 1 %   Basophils Absolute 0.1 0.0 - 0.1 K/uL   Immature Granulocytes 2 %   Abs Immature Granulocytes 0.18 (H) 0.00 - 0.07 K/uL    Comment: Performed at [facility], [address] [place], [place] [address]  Basic metabolic panel     Status: Abnormal   Collection Time: 02/05/18  3:28 PM  Result Value Ref Range   Sodium 142 135 - 145 mmol/L   Potassium 4.3 3.5 - 5.1 mmol/L   Chloride 82 (L) 98 - 111 mmol/L   CO2 49 (H) 22 - 32 mmol/L    Glucose, Bld 134 (H) 70 - 99 mg/dL   BUN 22 (H) 6 - 20 mg/dL   Creatinine, Ser 0.71 0.44 - 1.00 mg/dL   Calcium 8.9 8.9 - 10.3 mg/dL   GFR calc non Af Amer >60 >60 mL/min   GFR calc Af Amer >60 >60 mL/min    Comment: (NOTE) The eGFR has been calculated using the CKD EPI equation. This calculation has not been validated in all clinical situations. eGFR's persistently <60 mL/min signify possible Chronic Kidney Disease.    Anion gap 11 5 - 15    Comment: Performed at [facility], [address] [place], [place] [address]   Dg Chest Portable 1 View  Result Date: 02/05/2018 CLINICAL DATA:  Pt arrived from [facility] with resp distress with O2 sats in the 60's at the faciliity. Pt is normally on 3L. Pt arrived here with sats in the low 80's. Pt has hx of right shoulder dislocation. Pt NAD but is obese. Hx -.*comment was truncated*resp distress EXAM: PORTABLE CHEST 1 VIEW COMPARISON:  Radiograph 01/21/2018 FINDINGS: Exam is lordotic. Stable enlarged cardiac silhouette. Lung bases poorly evaluated. Central venous congestion noted. No overt pulmonary edema. No infiltrate identified. IMPRESSION: Somewhat limited exam.  Cardiomegaly and central venous congestion. Electronically Signed   By: Suzy Bouchard M.D.   On: 02/05/2018 15:43    Pending Labs [employer] (From admission, onward)    Start     Ordered   Signed and Held  CBC  (enoxaparin (LOVENOX)    CrCl >/= 30 ml/min)  Once,   R    Comments:  Baseline for enoxaparin therapy IF NOT ALREADY DRAWN.  Notify MD if PLT < 100 K.    Signed and Held   Signed and Held  Creatinine, serum  (enoxaparin (LOVENOX)    CrCl >/= 30 ml/min)  Once,   R    Comments:  Baseline for enoxaparin therapy IF NOT ALREADY DRAWN.    Signed and Held   Signed and Held  Creatinine, serum  (enoxaparin (LOVENOX)    CrCl >/= 30 ml/min)  Weekly,   R    Comments:  while on enoxaparin therapy    Signed and Held   Signed and Held  Basic metabolic  panel  Tomorrow morning,   R     Signed and Held   Signed and Held  CBC  Tomorrow morning,   R     Signed and  Held          Vitals/Pain Today's Vitals   02/05/18 1953 02/05/18 2029 02/05/18 2029 02/05/18 2100  BP: 113/86 131/83  124/75  Pulse: 84 92  90  Resp: _0 Temp:      TempSrc:      SpO2: 96% 95%  95%  Weight:      Height:      PainSc: 0-No pain 0-No pain 0-No pain Asleep    Isolation Precautions No active isolations  Medications Medications  acetaZOLAMIDE (DIAMOX) injection 250 mg (250 mg Intravenous Given 02/05/18 1712)  ipratropium-albuterol (DUONEB) 0.5-2.5 (3) MG/3ML nebulizer solution 3 mL (3 mLs Nebulization Given 02/05/18 1945)  ipratropium-albuterol (DUONEB) 0.5-2.5 (3) MG/3ML nebulizer solution 3 mL (3 mLs Nebulization Given 02/05/18 1943)  ipratropium-albuterol (DUONEB) 0.5-2.5 (3) MG/3ML nebulizer solution 3 mL (3 mLs Nebulization Given 02/05/18 1944)  methylPREDNISolone sodium succinate (SOLU-MEDROL) 125 mg/2 mL injection 125 mg (125 mg Intravenous Given 02/05/18 1941)    Mobility non-ambulatory

## 2018-02-05 NOTE — ED Notes (Signed)
Floor called for report, questions answered, Kim, RN requests pt be sent now, unable to send pt att  d/t treating allergic reaction in room 16 and then EMS in room 12

## 2018-02-05 NOTE — ED Notes (Signed)
Floor called for pt OTW att, transported with this RN and Genelle Bal att

## 2018-02-05 NOTE — ED Notes (Signed)
Floor Melissa, sec, reports can not take report att, will call this RN back  Time of msg 2127

## 2018-02-05 NOTE — ED Provider Notes (Signed)
[facility] Emergency Department Provider Note  ____________________________________________   I have reviewed the triage vital signs and the nursing notes.   HISTORY  Chief Complaint Respiratory Distress   History limited by: Not Limited   HPI Rebecca Bird is a 57 y.o. female who presents to the emergency department today because of concern for shortness of breath and hypoxia. Patient is coming from nursing facility. Is on 3 liters of oxygen at baseline. Apparently oxygen levels were in the 60s. Patient states that she was more short of breath today. Patient denies any significant chest pain. The patient has a history of frequent hospitalizations for respiratory difficulty. The patient denies any fevers.   Per medical record review patient has a history of frequent hospitalizations for respiratory distress.   Past Medical History:  Diagnosis Date  . Anemia   . CHF (congestive heart failure) (HCC)   . COPD (chronic obstructive pulmonary disease) (HCC)   . Depression   . Hypertension   . Sleep apnea   . Thyroid disease     Patient Active Problem List   Diagnosis Date Noted  . COPD with acute exacerbation (HCC) 12/27/2017  . HTN (hypertension) 12/27/2017  . Hypothyroidism 12/27/2017  . OSA (obstructive sleep apnea)   . Obesity hypoventilation syndrome (HCC)   . Acute respiratory failure with hypoxia and hypercapnia (HCC)   . Palliative care by specialist   . Goals of care, counseling/discussion   . Acute on chronic respiratory failure with hypoxia and hypercapnia (HCC) 11/16/2017  . Acute on chronic respiratory failure with hypoxemia (HCC) 11/01/2017    History reviewed. No pertinent surgical history.  Prior to Admission medications   Medication Sig Start Date End Date Taking? Authorizing Provider  budesonide (PULMICORT) 0.25 MG/2ML nebulizer solution Take 2 mLs (0.25 mg total) by nebulization 2 (two) times daily. 01/01/18   Altamese Dilling, MD  carvedilol (COREG) 3.125 MG tablet Take 3.125 mg by mouth 2 (two) times daily with a meal.    [provider]  citalopram (CELEXA) 40 MG tablet Take 40 mg by mouth daily.     [provider]  ferrous sulfate 325 (65 FE) MG tablet Take 325 mg by mouth daily with breakfast.    [provider]  furosemide (LASIX) 40 MG tablet Take 1 tablet (40 mg total) by mouth 2 (two) times daily. 11/25/17   Shaune Pollack, MD  ipratropium-albuterol (DUONEB) 0.5-2.5 (3) MG/3ML SOLN Take 3 mLs by nebulization every 6 (six) hours as needed (shortness of breath). Patient taking differently: Take 3 mLs by nebulization every 6 (six) hours as needed (shortness of breath/ wheezing).  11/05/17   Enid Baas, MD  levothyroxine (SYNTHROID, LEVOTHROID) 100 MCG tablet Take 1 tablet (100 mcg total) by mouth daily before breakfast. 11/06/17   Enid Baas, MD  Turmeric 450 MG CAPS Take 450 mg by mouth daily.    [provider]    Allergies Levaquin [levofloxacin in d5w]  History reviewed. No pertinent family history.  Social History Social History   Tobacco Use  . Smoking status: Former Games developer  . Smokeless tobacco: Never Used  Substance Use Topics  . Alcohol use: Not on file  . Drug use: Not on file    Review of Systems Constitutional: No fever/chills Eyes: No visual changes. ENT: No sore throat. Cardiovascular: Denies chest pain. Respiratory: Positive for shortness of breath. Gastrointestinal: No abdominal pain.  No nausea, no vomiting.  No diarrhea.   Genitourinary: Negative for dysuria. Musculoskeletal:  Negative for back pain. Skin: Negative for rash. Neurological: Negative for headaches, focal weakness or numbness.  ____________________________________________   PHYSICAL EXAM:  VITAL SIGNS: ED Triage Vitals  Enc Vitals Group     BP --      Pulse Rate 02/05/18 1517 99     Resp --      Temp 02/05/18 1518 (!) 97.5 F (36.4 C)     Temp  Source 02/05/18 1518 Oral     SpO2 02/05/18 1517 96 %     Weight 02/05/18 1518 (!) 445 lb (201.9 kg)     Height 02/05/18 1518 5\' 5"  (1.651 m)     Head Circumference --      Peak Flow --      Pain Score 02/05/18 1518 10   Constitutional: Alert and oriented.  Eyes: Conjunctivae are normal.  ENT      Head: Normocephalic and atraumatic.      Nose: No congestion/rhinnorhea.      Mouth/Throat: Mucous membranes are moist.      Neck: No stridor. Hematological/Lymphatic/Immunilogical: No cervical lymphadenopathy. Cardiovascular: Normal rate, regular rhythm.  No murmurs, rubs, or gallops.  Respiratory: Increased respiratory effort. Auscultation limited secondary to body habitus.  Gastrointestinal: Soft and non tender. No rebound. No guarding.  Genitourinary: Deferred Musculoskeletal: Normal range of motion in all extremities. No lower extremity edema. Neurologic:  Normal speech and language. No gross focal neurologic deficits are appreciated.  Skin:  Skin is warm, dry and intact. No rash noted. Psychiatric: Mood and affect are normal. Speech and behavior are normal. Patient exhibits appropriate insight and judgment.  ____________________________________________    LABS (pertinent positives/negatives)  BMP na 142, k 4.3, glu 134, cr 0.71 CBC wbc 8.1, hgb 12.9, plt 201  ____________________________________________   EKG  I, Phineas Semen, attending physician, personally viewed and interpreted this EKG  EKG Time: 1521 Rate: 92 Rhythm: sinus rhythm Axis: normal Intervals: qtc 449 QRS: narrow, q waves v1 ST changes: no st elevation Impression: abnormal ekg   ____________________________________________    RADIOLOGY  CXR Limited exam, vascular congestion and cardiomegaly ____________________________________________   PROCEDURES  Procedures  CRITICAL CARE Performed by: Phineas Semen   Total critical care time: 35 minutes  Critical care time was exclusive of  separately billable procedures and treating other patients.  Critical care was necessary to treat or prevent imminent or life-threatening deterioration.  Critical care was time spent personally by me on the following activities: development of treatment plan with patient and/or surrogate as well as nursing, discussions with consultants, evaluation of patient's response to treatment, examination of patient, obtaining history from patient or surrogate, ordering and performing treatments and interventions, ordering and review of laboratory studies, ordering and review of radiographic studies, pulse oximetry and re-evaluation of patient's condition.  ____________________________________________   INITIAL IMPRESSION / ASSESSMENT AND PLAN / ED COURSE  Pertinent labs & imaging results that were available during my care of the patient were reviewed by me and considered in my medical decision making (see chart for details).   Patient here with hypoxia and respiratory distress. Initially patient was placed on bipap given level of breathing difficulty. X-ray without obvious pneumonia. Patient does have history of COPD and frequent admissions for respiratory issues. In the emergency department patient was able to be weaned off of bipap. Patient given steroids and duoneb for COPD component. Patient will be admitted to the hospitalist service.   ____________________________________________   FINAL CLINICAL IMPRESSION(S) / ED DIAGNOSES  Final diagnoses:  Respiratory  distress  COPD exacerbation (HCC)  Hypoxia     Note: This dictation was prepared with Dragon dictation. Any transcriptional errors that result from this process are unintentional     Phineas Semen, MD 02/05/18 2056

## 2018-02-06 ENCOUNTER — Encounter: Payer: Self-pay | Admitting: Radiology

## 2018-02-06 ENCOUNTER — Other Ambulatory Visit: Payer: Self-pay

## 2018-02-06 LAB — CBC
HCT: 45.5 % (ref 36.0–46.0)
Hemoglobin: 13.4 g/dL (ref 12.0–15.0)
MCH: 31.6 pg (ref 26.0–34.0)
MCHC: 29.5 g/dL — AB (ref 30.0–36.0)
MCV: 107.3 fL — AB (ref 80.0–100.0)
Platelets: 197 10*3/uL (ref 150–400)
RBC: 4.24 MIL/uL (ref 3.87–5.11)
RDW: 15.9 % — ABNORMAL HIGH (ref 11.5–15.5)
WBC: 6.4 10*3/uL (ref 4.0–10.5)
nRBC: 0.5 % — ABNORMAL HIGH (ref 0.0–0.2)

## 2018-02-06 LAB — BASIC METABOLIC PANEL
ANION GAP: 13 (ref 5–15)
BUN: 20 mg/dL (ref 6–20)
CO2: 46 mmol/L — AB (ref 22–32)
Calcium: 9.4 mg/dL (ref 8.9–10.3)
Chloride: 85 mmol/L — ABNORMAL LOW (ref 98–111)
Creatinine, Ser: 0.63 mg/dL (ref 0.44–1.00)
GFR calc Af Amer: >60 mL/min (ref >60)
GLUCOSE: 157 mg/dL — AB (ref 70–99)
Potassium: 3.9 mmol/L (ref 3.5–5.1)
Sodium: 144 mmol/L (ref 135–145)

## 2018-02-06 LAB — MRSA PCR SCREENING: MRSA BY PCR: NEGATIVE

## 2018-02-06 MED ORDER — PREDNISONE 10 MG PO TABS: 40.0000 mg | ORAL_TABLET | Freq: Every day | ORAL | 0 refills | Status: AC

## 2018-02-06 MED ORDER — IPRATROPIUM-ALBUTEROL 0.5-2.5 (3) MG/3ML IN SOLN
3.0000 mL | Freq: Three times a day (TID) | RESPIRATORY_TRACT | Status: DC
  Filled 2018-02-06: qty 3

## 2018-02-06 MED ORDER — ALBUTEROL SULFATE (2.5 MG/3ML) 0.083% IN NEBU: 2.5000 mg | INHALATION_SOLUTION | RESPIRATORY_TRACT | Status: DC | PRN

## 2018-02-06 NOTE — Progress Notes (Signed)
Report called to [place] at [facility].

## 2018-02-06 NOTE — Care Management Obs Status (Signed)
MEDICARE OBSERVATION STATUS NOTIFICATION   Patient Details  Name: Rebecca Bird MRN: [id number] Date of Birth: 06-21-1960   Medicare Observation Status Notification Given:  Yes    Gwenette Greet, RN 02/06/2018, 11:27 AM

## 2018-02-06 NOTE — Care Management Important Message (Signed)
Important Message  Patient Details  Name: ILARIA MUCH MRN: [id number] Date of Birth: 07/23/1960   Medicare Important Message Given:       Gwenette Greet, RN 02/06/2018, 11:26 AM

## 2018-02-06 NOTE — Discharge Summary (Signed)
[facility] [place] [facility]   PATIENT NAME: Rebecca Bird    MR#:  [id number]  DATE OF BIRTH:  07-17-1960  DATE OF ADMISSION:  02/05/2018 ADMITTING PHYSICIAN: Oralia Manis, MD  DATE OF DISCHARGE: 02/06/2018  PRIMARY CARE PHYSICIAN: Dimple Casey, MD    ADMISSION DIAGNOSIS:  Respiratory distress [R06.03] Hypoxia [R09.02] COPD exacerbation (HCC) [J44.1]  DISCHARGE DIAGNOSIS:  Principal Problem:   Acute on chronic respiratory failure with hypoxemia (HCC) Active Problems:   OSA (obstructive sleep apnea)   Obesity hypoventilation syndrome (HCC)   COPD with acute exacerbation (HCC)   HTN (hypertension)   Hypothyroidism   Acute respiratory failure with hypoxia (HCC)   SECONDARY DIAGNOSIS:   Past Medical History:  Diagnosis Date  . Anemia   . CHF (congestive heart failure) (HCC)   . COPD (chronic obstructive pulmonary disease) (HCC)   . Depression   . Hypertension   . Obesity hypoventilation syndrome (HCC)   . Sleep apnea   . Thyroid disease     HOSPITAL COURSE:  57 year old female with past medical history of morbid obesity, obesity pickwickian syndrome, chronic diastolic heart failure and chronic anemia who presented to the hospital with shortness of breath.  1.  Acute on chronic hypoxic/hypercapnic respiratory failure due to obesity pickwickian syndrome/COPD exacerbation: Patient was initially placed on BiPAP and then transition to nasal cannula.  Her symptoms have resolved.  She feels that she is at her baseline.  She will need to continue oxygen via nasal cannula during the day and BiPAP at night.  O2 saturations greater than 90% are recommended.  2.  OSA: Patient will continue BiPAP  3.  History of chronic diastolic heart failure without signs of exacerbation at this time She will continue Lasix  4.  Morbid obesity: Encouraged weight loss as tolerated  5.  Essential hypertension: Continue Coreg  6.  Depression: Continue Celexa  7.   Hypothyroidism: Continue Synthroid  8.  Anemia of chronic disease: Continue with ferrous sulfate  Due to her comorbidities patient is high risk of increased hospitalizations.  DISCHARGE CONDITIONS AND DIET:   Stable for discharge on regular diet  CONSULTS OBTAINED:    DRUG ALLERGIES:   Allergies  Allergen Reactions  . Levaquin [Levofloxacin In D5w] Itching and Rash    DISCHARGE MEDICATIONS:   Allergies as of 02/06/2018      Reactions   Levaquin [levofloxacin In D5w] Itching, Rash      Medication List    TAKE these medications   budesonide 0.25 MG/2ML nebulizer solution Commonly known as:  PULMICORT Take 2 mLs (0.25 mg total) by nebulization 2 (two) times daily.   carvedilol 3.125 MG tablet Commonly known as:  COREG Take 3.125 mg by mouth 2 (two) times daily with a meal.   citalopram 40 MG tablet Commonly known as:  CELEXA Take 40 mg by mouth daily.   ferrous sulfate 325 (65 FE) MG tablet Take 325 mg by mouth daily with breakfast.   furosemide 40 MG tablet Commonly known as:  LASIX Take 1 tablet (40 mg total) by mouth 2 (two) times daily.   ipratropium-albuterol 0.5-2.5 (3) MG/3ML Soln Commonly known as:  DUONEB Take 3 mLs by nebulization every 6 (six) hours as needed (shortness of breath). What changed:  reasons to take this   levothyroxine 100 MCG tablet Commonly known as:  SYNTHROID, LEVOTHROID Take 1 tablet (100 mcg total) by mouth daily before breakfast.   predniSONE 10 MG tablet Commonly known as:  DELTASONE Take 4 tablets (40 mg total) by mouth daily with breakfast for 5 days.   Turmeric 450 MG Caps Take 450 mg by mouth daily.         Today   CHIEF COMPLAINT:   Patient feels that she is at her baseline no increased shortness of breath, wheezing or chest pain   VITAL SIGNS:  Blood pressure 125/66, pulse 78, temperature 98.3 F (36.8 C), temperature source Oral, resp. rate 18, height 5\' 5"  (1.651 m), weight (!) 201.9 kg, SpO2 97  %.   REVIEW OF SYSTEMS:  Review of Systems  Constitutional: Negative.  Negative for chills, fever and malaise/fatigue.  HENT: Negative.  Negative for ear discharge, ear pain, hearing loss, nosebleeds and sore throat.   Eyes: Negative.  Negative for blurred vision and pain.  Respiratory: Negative.  Negative for cough, hemoptysis, shortness of breath and wheezing.   Cardiovascular: Negative.  Negative for chest pain, palpitations and leg swelling.  Gastrointestinal: Negative.  Negative for abdominal pain, blood in stool, diarrhea, nausea and vomiting.  Genitourinary: Negative.  Negative for dysuria.  Musculoskeletal: Negative.  Negative for back pain.  Skin: Negative.   Neurological: Negative for dizziness, tremors, speech change, focal weakness, seizures and headaches.  Endo/Heme/Allergies: Negative.  Does not bruise/bleed easily.  Psychiatric/Behavioral: Negative.  Negative for depression, hallucinations and suicidal ideas.     PHYSICAL EXAMINATION:  GENERAL:  57 y.o.-year-old patient lying in the bed with no acute distress.  Morbidly obese NECK:  Supple, no jugular venous distention. No thyroid enlargement, no tenderness.  LUNGS: Normal breath sounds bilaterally, no wheezing, rales,rhonchi  No use of accessory muscles of respiration.  CARDIOVASCULAR: S1, S2 normal. No murmurs, rubs, or gallops.  ABDOMEN: Soft, non-tender, non-distended. Bowel sounds present. No organomegaly or mass.  EXTREMITIES: No pedal edema, cyanosis, or clubbing.  PSYCHIATRIC: The patient is alert and oriented x 3.  SKIN: No obvious rash, lesion, or ulcer.   DATA REVIEW:   CBC Recent Labs  Lab 02/06/18 0532  WBC 6.4  HGB 13.4  HCT 45.5  PLT 197    Chemistries  Recent Labs  Lab 02/06/18 0532  NA 144  K 3.9  CL 85*  CO2 46*  GLUCOSE 157*  BUN 20  CREATININE 0.63  CALCIUM 9.4    Cardiac Enzymes No results for input(s): TROPONINI in the last 168 hours.  Microbiology Results   @[email or web address] @  RADIOLOGY:  Dg Chest Portable 1 View  Result Date: 02/05/2018 CLINICAL DATA:  Pt arrived from [facility] with resp distress with O2 sats in the 60's at the faciliity. Pt is normally on 3L. Pt arrived here with sats in the low 80's. Pt has hx of right shoulder dislocation. Pt NAD but is obese. Hx -.*comment was truncated*resp distress EXAM: PORTABLE CHEST 1 VIEW COMPARISON:  Radiograph 01/21/2018 FINDINGS: Exam is lordotic. Stable enlarged cardiac silhouette. Lung bases poorly evaluated. Central venous congestion noted. No overt pulmonary edema. No infiltrate identified. IMPRESSION: Somewhat limited exam.  Cardiomegaly and central venous congestion. Electronically Signed   By: Genevive Bi M.D.   On: 02/05/2018 15:43      Allergies as of 02/06/2018      Reactions   Levaquin [levofloxacin In D5w] Itching, Rash      Medication List    TAKE these medications   budesonide 0.25 MG/2ML nebulizer solution Commonly known as:  PULMICORT Take 2 mLs (0.25 mg total) by nebulization 2 (two) times daily.   carvedilol 3.125 MG tablet Commonly known as:  COREG Take 3.125 mg by mouth 2 (two) times daily with a meal.   citalopram 40 MG tablet Commonly known as:  CELEXA Take 40 mg by mouth daily.   ferrous sulfate 325 (65 FE) MG tablet Take 325 mg by mouth daily with breakfast.   furosemide 40 MG tablet Commonly known as:  LASIX Take 1 tablet (40 mg total) by mouth 2 (two) times daily.   ipratropium-albuterol 0.5-2.5 (3) MG/3ML Soln Commonly known as:  DUONEB Take 3 mLs by nebulization every 6 (six) hours as needed (shortness of breath). What changed:  reasons to take this   levothyroxine 100 MCG tablet Commonly known as:  SYNTHROID, LEVOTHROID Take 1 tablet (100 mcg total) by mouth daily before breakfast.   predniSONE 10 MG tablet Commonly known as:  DELTASONE Take 4 tablets (40 mg total) by mouth daily with breakfast for 5 days.   Turmeric 450 MG  Caps Take 450 mg by mouth daily.         Management plans discussed with the patient and she is in agreement. Stable for discharge snf  Patient should follow up with pcp  CODE STATUS:     Code Status Orders  (From admission, onward)         Start     Ordered   02/05/18 2305  Do not attempt resuscitation (DNR)  Continuous    Question Answer Comment  In the event of cardiac or respiratory ARREST Do not call a "code blue"   In the event of cardiac or respiratory ARREST Do not perform Intubation, CPR, defibrillation or ACLS   In the event of cardiac or respiratory ARREST Use medication by any route, position, wound care, and other measures to relive pain and suffering. May use oxygen, suction and manual treatment of airway obstruction as needed for comfort.   Comments Patient wishes for one cycle of CPR/ACLS. If she is not revived, do not attempt further CPR/ACLS.      02/05/18 2304        Code Status History    Date Active Date Inactive Code Status Order ID Comments User Context   01/20/2018 0455 01/22/2018 1915 DNR [id number]  Arnaldo Natal, MD Inpatient   12/28/2017 0125 01/01/2018 2044 DNR [id number]  Oralia Manis, MD ED   11/21/2017 1408 11/25/2017 2055 Partial Code [id number]  Alita Chyle, NP Inpatient   11/16/2017 0328 11/21/2017 1408 Full Code [id number]  Arnaldo Natal, MD Inpatient   11/01/2017 0850 11/05/2017 1903 Full Code [id number]  Arnaldo Natal, MD Inpatient    Advance Directive Documentation     Most Recent Value  Type of Advance Directive  Healthcare Power of Attorney, Out of facility DNR (pink MOST or yellow form)  Pre-existing out of facility DNR order (yellow form or pink MOST form)  Physician notified to receive inpatient order  "MOST" Form in Place?  -      TOTAL TIME TAKING CARE OF THIS PATIENT: 39 minutes.    Note: This dictation was prepared with Dragon dictation along with smaller phrase technology. Any transcriptional errors that result  from this process are unintentional.  Primus Gritton M.D on 02/06/2018 at 10:36 AM  Between 7am to 6pm - Pager - [phone] After 6pm go to [email or web address] - password [email or web address]  [facility]  Office  [phone]  CC: Primary care physician; Dimple Casey, MD

## 2018-02-06 NOTE — Clinical Social Work Note (Signed)
Clinical Social Work Assessment  Patient Details  Name: Rebecca Bird MRN: [id number] Date of Birth: 06/26/1960  Date of referral:  02/06/18               Reason for consult:  Facility Placement                Permission sought to share information with:  Case Manager, Customer service manager, Family Supports Permission granted to share information::  Yes, Verbal Permission Granted  Name::        Agency::     Relationship::     Contact Information:     Housing/Transportation Living arrangements for the past 2 months:  [place] of Information:  Patient Patient Interpreter Needed:  None Criminal Activity/Legal Involvement Pertinent to Current Situation/Hospitalization:  No - Comment as needed Significant Relationships:  Adult Children Lives with:  Facility Resident Do you feel safe going back to the place where you live?  Yes Need for family participation in patient care:  Yes (Comment)  Care giving concerns:  Patient is a long term resident at [facility].    Social Worker assessment / plan:  CSW consulted for SNF placement. CSW met with patient to discuss discharge plan. CSW introduced self and explained role. Patient states that she lives at [facility]. Patient states that she would like to return to [facility] when medically stable. CSW also spoke to patient's daughter Ivan Croft [phone] and she confirmed above information. Per MD patient will be ready for discharge today. CSW informed patient and daughter that patient will DC today and both are in agreement. CSW also notified Claiborne Billings at [facility] that patient will DC today. Patient will be transported by EMS and RN will call report and call for transport.   Employment status:  Retired Forensic scientist:  Medicare PT Recommendations:  Not assessed at this time Information / Referral to community resources:     Patient/Family's Response to care:  Patient and  daughter thanked CSW for assistance   Patient/Family's Understanding of and Emotional Response to Diagnosis, Current Treatment, and Prognosis:  Patient and daughter are in agreement with DC back to [facility] today   Emotional Assessment Appearance:  Appears stated age Attitude/Demeanor/Rapport:    Affect (typically observed):  Accepting, Happy, Pleasant Orientation:  Oriented to Self, Oriented to Place, Oriented to  Time, Oriented to Situation Alcohol / Substance use:  Not Applicable Psych involvement (Current and /or in the community):  No (Comment)  Discharge Needs  Concerns to be addressed:  Discharge Planning Concerns Readmission within the last 30 days:  Yes Current discharge risk:  None Barriers to Discharge:  Continued Medical Work up   [employer], [place] 02/06/2018, 10:04 AM

## 2018-02-06 NOTE — [place] FL2 (Signed)
[place] MEDICAID FL2 LEVEL OF CARE SCREENING TOOL     IDENTIFICATION  Patient Name: Rebecca Bird Birthdate: [birth year] Sex: female Admission Date (Current Location): 02/05/2018  [place] and [place] Number:  Chiropodist and Address:  [facility], [address] [place], [place] [address]      Provider Number: [id number]  Attending Physician Name and Address:  Adrian Saran, MD  Relative Name and Phone Number:  Dorothey Baseman- daughter [phone]    Current Level of Care: Hospital Recommended Level of Care: Skilled Nursing Facility Prior Approval Number:    Date Approved/Denied:   PASRR Number:    Discharge Plan: SNF    Current Diagnoses: Patient Active Problem List   Diagnosis Date Noted  . Acute respiratory failure with hypoxia (HCC) 02/05/2018  . COPD with acute exacerbation (HCC) 12/27/2017  . HTN (hypertension) 12/27/2017  . Hypothyroidism 12/27/2017  . OSA (obstructive sleep apnea)   . Obesity hypoventilation syndrome (HCC)   . Palliative care by specialist   . Goals of care, counseling/discussion   . Acute on chronic respiratory failure with hypoxemia (HCC) 11/01/2017    Orientation RESPIRATION BLADDER Height & Weight     Self, Time, Situation, Place  O2(6 liters ) Incontinent Weight: (!) 445 lb (201.9 kg) Height:  5\' 5"  (165.1 cm)  BEHAVIORAL SYMPTOMS/MOOD NEUROLOGICAL BOWEL NUTRITION STATUS  (none) (none) Continent Diet(Heart Healthy )  AMBULATORY STATUS COMMUNICATION OF NEEDS Skin   Extensive Assist Verbally Normal                       Personal Care Assistance Level of Assistance  Bathing, Feeding, Dressing Bathing Assistance: Limited assistance Feeding assistance: Independent Dressing Assistance: Limited assistance     Functional Limitations Info  Sight, Hearing, Speech Sight Info: Adequate Hearing Info: Adequate Speech Info: Adequate    SPECIAL CARE FACTORS FREQUENCY                        Contractures Contractures Info: Not present    Additional Factors Info  Code Status, Allergies Code Status Info: DNR Allergies Info: Levaquin            Current Medications (02/06/2018):  This is the current hospital active medication list Current Facility-Administered Medications  Medication Dose Route Frequency Provider Last Rate Last Dose  . acetaminophen (TYLENOL) tablet 650 mg  650 mg Oral Q6H PRN Oralia Manis, MD       Or  . acetaminophen (TYLENOL) suppository 650 mg  650 mg Rectal Q6H PRN Oralia Manis, MD      . albuterol (PROVENTIL) (2.5 MG/3ML) 0.083% nebulizer solution 2.5 mg  2.5 mg Nebulization Q2H PRN Adrian Saran, MD      . budesonide (PULMICORT) nebulizer solution 0.25 mg  0.25 mg Nebulization BID Oralia Manis, MD   0.25 mg at 02/06/18 0818  . carvedilol (COREG) tablet 3.125 mg  3.125 mg Oral BID WC Oralia Manis, MD   3.125 mg at 02/06/18 0911  . citalopram (CELEXA) tablet 40 mg  40 mg Oral Daily Oralia Manis, MD   40 mg at 02/06/18 0911  . enoxaparin (LOVENOX) injection 40 mg  40 mg Subcutaneous BID Oralia Manis, MD   40 mg at 02/06/18 0910  . furosemide (LASIX) tablet 40 mg  40 mg Oral BID Oralia Manis, MD   40 mg at 02/06/18 0911  . ipratropium-albuterol (DUONEB) 0.5-2.5 (3) MG/3ML nebulizer solution 3 mL  3 mL Nebulization  TID Adrian Saran, MD      . levothyroxine (SYNTHROID, LEVOTHROID) tablet 100 mcg  100 mcg Oral QAC breakfast Oralia Manis, MD   100 mcg at 02/06/18 0911  . methylPREDNISolone sodium succinate (SOLU-MEDROL) 125 mg/2 mL injection 60 mg  60 mg Intravenous Q6H Oralia Manis, MD   60 mg at 02/06/18 0910  . ondansetron (ZOFRAN) tablet 4 mg  4 mg Oral Q6H PRN Oralia Manis, MD       Or  . ondansetron [facility]) injection 4 mg  4 mg Intravenous Q6H PRN Oralia Manis, MD         Discharge Medications: Please see discharge summary for a list of discharge medications.  Relevant Imaging Results:  Relevant Lab Results:   Additional  Information    Stevens Magwood  Rinaldo Ratel, [address]

## 2018-02-06 NOTE — Care Management CC44 (Signed)
Condition Code 44 Documentation Completed  Patient Details  Name: Rebecca Bird MRN: [id number] Date of Birth: 08-20-1960   Condition Code 44 given:  Yes Patient signature on Condition Code 44 notice:  Yes Documentation of 2 MD's agreement:  Yes Code 44 added to claim:  Yes    Gwenette Greet, RN 02/06/2018, 11:27 AM

## 2018-02-06 NOTE — Progress Notes (Signed)
EMS contacted for transport.  Danielle Willford Rabideau, RN, BSN 

## 2018-02-15 ENCOUNTER — Other Ambulatory Visit: Payer: Self-pay | Admitting: Internal Medicine

## 2018-02-15 DIAGNOSIS — I208 Other forms of angina pectoris: Secondary | ICD-10-CM

## 2018-02-15 DIAGNOSIS — I509 Heart failure, unspecified: Secondary | ICD-10-CM

## 2018-02-15 DIAGNOSIS — R0602 Shortness of breath: Secondary | ICD-10-CM

## 2018-02-16 ENCOUNTER — Other Ambulatory Visit: Payer: Self-pay | Admitting: Internal Medicine

## 2018-02-16 DIAGNOSIS — R0602 Shortness of breath: Secondary | ICD-10-CM

## 2018-02-16 DIAGNOSIS — I509 Heart failure, unspecified: Secondary | ICD-10-CM

## 2018-02-16 DIAGNOSIS — I208 Other forms of angina pectoris: Secondary | ICD-10-CM

## 2018-02-19 ENCOUNTER — Emergency Department: Payer: Medicare Other

## 2018-02-19 ENCOUNTER — Inpatient Hospital Stay
Admission: EM | Admit: 2018-02-19 | Discharge: 2018-02-22 | DRG: 189 | Disposition: A | Discharge status: Skilled Nursing Facility | Payer: Medicare Other | Attending: Internal Medicine | Admitting: Internal Medicine

## 2018-02-19 ENCOUNTER — Other Ambulatory Visit: Payer: Self-pay

## 2018-02-19 DIAGNOSIS — Z881 Allergy status to other antibiotic agents status: Secondary | ICD-10-CM

## 2018-02-19 DIAGNOSIS — E039 Hypothyroidism, unspecified: Secondary | ICD-10-CM | POA: Diagnosis present

## 2018-02-19 DIAGNOSIS — F329 Major depressive disorder, single episode, unspecified: Secondary | ICD-10-CM | POA: Diagnosis present

## 2018-02-19 DIAGNOSIS — J9622 Acute and chronic respiratory failure with hypercapnia: Secondary | ICD-10-CM | POA: Diagnosis present

## 2018-02-19 DIAGNOSIS — Z87891 Personal history of nicotine dependence: Secondary | ICD-10-CM | POA: Diagnosis not present

## 2018-02-19 DIAGNOSIS — Z79890 Hormone replacement therapy (postmenopausal): Secondary | ICD-10-CM

## 2018-02-19 DIAGNOSIS — Z66 Do not resuscitate: Secondary | ICD-10-CM | POA: Diagnosis present

## 2018-02-19 DIAGNOSIS — J440 Chronic obstructive pulmonary disease with acute lower respiratory infection: Secondary | ICD-10-CM | POA: Diagnosis present

## 2018-02-19 DIAGNOSIS — Z8249 Family history of ischemic heart disease and other diseases of the circulatory system: Secondary | ICD-10-CM

## 2018-02-19 DIAGNOSIS — J9811 Atelectasis: Secondary | ICD-10-CM

## 2018-02-19 DIAGNOSIS — Z7951 Long term (current) use of inhaled steroids: Secondary | ICD-10-CM

## 2018-02-19 DIAGNOSIS — I110 Hypertensive heart disease with heart failure: Secondary | ICD-10-CM | POA: Diagnosis present

## 2018-02-19 DIAGNOSIS — I5043 Acute on chronic combined systolic (congestive) and diastolic (congestive) heart failure: Secondary | ICD-10-CM | POA: Diagnosis present

## 2018-02-19 DIAGNOSIS — J441 Chronic obstructive pulmonary disease with (acute) exacerbation: Secondary | ICD-10-CM | POA: Diagnosis present

## 2018-02-19 DIAGNOSIS — Z6841 Body Mass Index (BMI) 40.0 and over, adult: Secondary | ICD-10-CM

## 2018-02-19 DIAGNOSIS — Z79899 Other long term (current) drug therapy: Secondary | ICD-10-CM

## 2018-02-19 DIAGNOSIS — Z7189 Other specified counseling: Secondary | ICD-10-CM | POA: Diagnosis not present

## 2018-02-19 DIAGNOSIS — J189 Pneumonia, unspecified organism: Secondary | ICD-10-CM | POA: Diagnosis not present

## 2018-02-19 DIAGNOSIS — E662 Morbid (severe) obesity with alveolar hypoventilation: Secondary | ICD-10-CM | POA: Diagnosis present

## 2018-02-19 DIAGNOSIS — R0603 Acute respiratory distress: Secondary | ICD-10-CM | POA: Diagnosis present

## 2018-02-19 DIAGNOSIS — J9602 Acute respiratory failure with hypercapnia: Secondary | ICD-10-CM | POA: Diagnosis not present

## 2018-02-19 DIAGNOSIS — J9621 Acute and chronic respiratory failure with hypoxia: Principal | ICD-10-CM | POA: Diagnosis present

## 2018-02-19 DIAGNOSIS — J9601 Acute respiratory failure with hypoxia: Secondary | ICD-10-CM

## 2018-02-19 DIAGNOSIS — Z515 Encounter for palliative care: Secondary | ICD-10-CM | POA: Diagnosis not present

## 2018-02-19 LAB — CBC WITH DIFFERENTIAL/PLATELET
ABS IMMATURE GRANULOCYTES: 0.09 10*3/uL — AB (ref 0.00–0.07)
BASOS ABS: 0 10*3/uL (ref 0.0–0.1)
BASOS PCT: 1 %
EOS PCT: 2 %
Eosinophils Absolute: 0.1 10*3/uL (ref 0.0–0.5)
HCT: 46 % (ref 36.0–46.0)
HEMOGLOBIN: 13.4 g/dL (ref 12.0–15.0)
Immature Granulocytes: 1 %
LYMPHS PCT: 19 %
Lymphs Abs: 1.3 10*3/uL (ref 0.7–4.0)
MCH: 30.9 pg (ref 26.0–34.0)
MCHC: 29.1 g/dL — AB (ref 30.0–36.0)
MCV: 106.2 fL — ABNORMAL HIGH (ref 80.0–100.0)
MONO ABS: 0.6 10*3/uL (ref 0.1–1.0)
Monocytes Relative: 9 %
NEUTROS ABS: 4.4 10*3/uL (ref 1.7–7.7)
NRBC: 0 % (ref 0.0–0.2)
Neutrophils Relative %: 68 %
PLATELETS: 169 10*3/uL (ref 150–400)
RBC: 4.33 MIL/uL (ref 3.87–5.11)
RDW: 16.2 % — ABNORMAL HIGH (ref 11.5–15.5)
WBC: 6.5 10*3/uL (ref 4.0–10.5)

## 2018-02-19 LAB — COMPREHENSIVE METABOLIC PANEL
ALK PHOS: 71 U/L (ref 38–126)
ALT: 28 U/L (ref 0–44)
AST: 40 U/L (ref 15–41)
Albumin: 3.4 g/dL — ABNORMAL LOW (ref 3.5–5.0)
Anion gap: 18 — ABNORMAL HIGH (ref 5–15)
BUN: 20 mg/dL (ref 6–20)
CALCIUM: 8.6 mg/dL — AB (ref 8.9–10.3)
CHLORIDE: 81 mmol/L — AB (ref 98–111)
CO2: 45 mmol/L — ABNORMAL HIGH (ref 22–32)
Creatinine, Ser: 0.49 mg/dL (ref 0.44–1.00)
GLUCOSE: 122 mg/dL — AB (ref 70–99)
Potassium: 5.1 mmol/L (ref 3.5–5.1)
Sodium: 144 mmol/L (ref 135–145)
Total Bilirubin: 1.9 mg/dL — ABNORMAL HIGH (ref 0.3–1.2)
Total Protein: 6.5 g/dL (ref 6.5–8.1)

## 2018-02-19 LAB — GLUCOSE, CAPILLARY: Glucose-Capillary: 160 mg/dL — ABNORMAL HIGH (ref 70–99)

## 2018-02-19 LAB — BRAIN NATRIURETIC PEPTIDE: B Natriuretic Peptide: 154 pg/mL — ABNORMAL HIGH (ref 0.0–100.0)

## 2018-02-19 LAB — TROPONIN I

## 2018-02-19 LAB — TSH: TSH: 2.747 u[IU]/mL (ref 0.350–4.500)

## 2018-02-19 MED ORDER — SODIUM CHLORIDE 0.9 % IV SOLN
500.0000 mg | Freq: Once | INTRAVENOUS | Status: AC
  Administered 2018-02-19: 500 mg via INTRAVENOUS
  Filled 2018-02-19: qty 500

## 2018-02-19 MED ORDER — DOCUSATE SODIUM 100 MG PO CAPS
100.0000 mg | ORAL_CAPSULE | Freq: Two times a day (BID) | ORAL | Status: DC
  Administered 2018-02-19 – 2018-02-22 (×7): 100 mg via ORAL
  Filled 2018-02-19 (×7): qty 1

## 2018-02-19 MED ORDER — ALBUTEROL SULFATE (2.5 MG/3ML) 0.083% IN NEBU
15.0000 mg | INHALATION_SOLUTION | Freq: Once | RESPIRATORY_TRACT | Status: AC
  Administered 2018-02-19: 15 mg via RESPIRATORY_TRACT
  Filled 2018-02-19: qty 18

## 2018-02-19 MED ORDER — ENOXAPARIN SODIUM 40 MG/0.4ML ~~LOC~~ SOLN: 40.0000 mg | SUBCUTANEOUS | Status: DC

## 2018-02-19 MED ORDER — SODIUM CHLORIDE 0.9 % IV SOLN
1.0000 g | Freq: Once | INTRAVENOUS | Status: AC
  Administered 2018-02-19: 1 g via INTRAVENOUS
  Filled 2018-02-19: qty 10

## 2018-02-19 MED ORDER — LEVOTHYROXINE SODIUM 100 MCG PO TABS
100.0000 ug | ORAL_TABLET | Freq: Every day | ORAL | Status: DC
  Administered 2018-02-19 – 2018-02-22 (×4): 100 ug via ORAL
  Filled 2018-02-19 (×3): qty 1

## 2018-02-19 MED ORDER — FUROSEMIDE 40 MG PO TABS
40.0000 mg | ORAL_TABLET | Freq: Two times a day (BID) | ORAL | Status: DC
  Administered 2018-02-19 – 2018-02-22 (×7): 40 mg via ORAL
  Filled 2018-02-19: qty 1
  Filled 2018-02-19 (×3): qty 2
  Filled 2018-02-19: qty 1
  Filled 2018-02-19 (×2): qty 2

## 2018-02-19 MED ORDER — ALBUTEROL SULFATE (2.5 MG/3ML) 0.083% IN NEBU
INHALATION_SOLUTION | RESPIRATORY_TRACT | Status: AC
  Administered 2018-02-19: 15 mg via RESPIRATORY_TRACT
  Filled 2018-02-19: qty 18

## 2018-02-19 MED ORDER — CARVEDILOL 3.125 MG PO TABS
3.1250 mg | ORAL_TABLET | Freq: Two times a day (BID) | ORAL | Status: DC
  Administered 2018-02-19 – 2018-02-22 (×5): 3.125 mg via ORAL
  Filled 2018-02-19 (×5): qty 1

## 2018-02-19 MED ORDER — IPRATROPIUM-ALBUTEROL 0.5-2.5 (3) MG/3ML IN SOLN
3.0000 mL | RESPIRATORY_TRACT | Status: DC
  Administered 2018-02-19 – 2018-02-22 (×17): 3 mL via RESPIRATORY_TRACT
  Filled 2018-02-19 (×17): qty 3

## 2018-02-19 MED ORDER — ENOXAPARIN SODIUM 40 MG/0.4ML ~~LOC~~ SOLN
40.0000 mg | Freq: Two times a day (BID) | SUBCUTANEOUS | Status: DC
  Administered 2018-02-19 – 2018-02-22 (×6): 40 mg via SUBCUTANEOUS
  Filled 2018-02-19 (×6): qty 0.4

## 2018-02-19 MED ORDER — IPRATROPIUM-ALBUTEROL 0.5-2.5 (3) MG/3ML IN SOLN: 3.0000 mL | Freq: Four times a day (QID) | RESPIRATORY_TRACT | Status: DC | PRN

## 2018-02-19 MED ORDER — IPRATROPIUM-ALBUTEROL 0.5-2.5 (3) MG/3ML IN SOLN
3.0000 mL | Freq: Once | RESPIRATORY_TRACT | Status: AC
  Administered 2018-02-19: 3 mL via RESPIRATORY_TRACT
  Filled 2018-02-19: qty 3

## 2018-02-19 MED ORDER — BUDESONIDE 0.25 MG/2ML IN SUSP
0.2500 mg | Freq: Two times a day (BID) | RESPIRATORY_TRACT | Status: DC
  Administered 2018-02-19 – 2018-02-20 (×2): 0.25 mg via RESPIRATORY_TRACT
  Filled 2018-02-19 (×2): qty 2

## 2018-02-19 MED ORDER — ORAL CARE MOUTH RINSE
15.0000 mL | Freq: Two times a day (BID) | OROMUCOSAL | Status: DC
  Administered 2018-02-19 – 2018-02-22 (×6): 15 mL via OROMUCOSAL

## 2018-02-19 MED ORDER — ACETAMINOPHEN 325 MG PO TABS: 650.0000 mg | ORAL_TABLET | Freq: Four times a day (QID) | ORAL | Status: DC | PRN

## 2018-02-19 MED ORDER — ONDANSETRON HCL 4 MG PO TABS: 4.0000 mg | ORAL_TABLET | Freq: Four times a day (QID) | ORAL | Status: DC | PRN

## 2018-02-19 MED ORDER — CITALOPRAM HYDROBROMIDE 20 MG PO TABS
40.0000 mg | ORAL_TABLET | Freq: Every day | ORAL | Status: DC
  Administered 2018-02-19 – 2018-02-22 (×4): 40 mg via ORAL
  Filled 2018-02-19 (×5): qty 2

## 2018-02-19 MED ORDER — FERROUS SULFATE 325 (65 FE) MG PO TABS
325.0000 mg | ORAL_TABLET | Freq: Every day | ORAL | Status: DC
  Administered 2018-02-19 – 2018-02-22 (×4): 325 mg via ORAL
  Filled 2018-02-19 (×5): qty 1

## 2018-02-19 MED ORDER — ACETAMINOPHEN 650 MG RE SUPP: 650.0000 mg | Freq: Four times a day (QID) | RECTAL | Status: DC | PRN

## 2018-02-19 MED ORDER — TURMERIC 450 MG PO CAPS: 450.0000 mg | ORAL_CAPSULE | Freq: Every day | ORAL | Status: DC

## 2018-02-19 MED ORDER — ONDANSETRON HCL 4 MG/2ML IJ SOLN: 4.0000 mg | Freq: Four times a day (QID) | INTRAMUSCULAR | Status: DC | PRN

## 2018-02-19 NOTE — ED Triage Notes (Signed)
Patient coming [facility] from [facility] for respiratory distress. Patient 58% oxygen saturation on CPAP at EMS arrival per EMS. Patient placed on EMS CPAP machine with PEEP of 10 and increased to 96%.   Patient has hx COPD and respiratory failure.   Patient was given 1 gram Magnesium and 125 MG solumedrol in transport.  EMS vitals: BP 142/70, HR 100

## 2018-02-19 NOTE — H&P (Signed)
Rebecca Bird is an 57 y.o. female.   Chief Complaint: Respiratory distress HPI: The patient with past medical history of COPD, obesity hypoventilation syndrome, sleep apnea and CHF presents to the emergency department in respiratory distress.  She reports worsening dyspnea throughout the day.  The patient was found to have oxygen saturations of 58%.  She was transported via EMS on CPAP.  She achieved Solu-Medrol 125 mg and magnesium 1 g in route.  She was transitioned to BiPAP in the emergency department by her to the emergency department staff calling the hospitalist service for admission  Past Medical History:  Diagnosis Date  . Anemia   . CHF (congestive heart failure) ([place])   . COPD (chronic obstructive pulmonary disease) ([place])   . Depression   . Hypertension   . Obesity hypoventilation syndrome ([place])   . Sleep apnea   . Thyroid disease     Past Surgical History:  Procedure Laterality Date  . ROTATOR CUFF REPAIR      Family History  Problem Relation Age of Onset  . Hypertension Mother    Social History:  reports that she has quit smoking. She quit after 10.00 years of use. She has never used smokeless tobacco. She reports that she drank alcohol. She reports that she has current or past drug history.  Allergies:  Allergies  Allergen Reactions  . Levaquin [Levofloxacin In D5w] Itching and Rash    Prior to Admission medications   Medication Sig Start Date End Date Taking? Authorizing Provider  budesonide (PULMICORT) 0.25 MG/2ML nebulizer solution Take 2 mLs (0.25 mg total) by nebulization 2 (two) times daily. 01/01/18   Vaughan Basta, MD  carvedilol (COREG) 3.125 MG tablet Take 3.125 mg by mouth 2 (two) times daily with a meal.    [provider]  citalopram (CELEXA) 40 MG tablet Take 40 mg by mouth daily.     [provider]  ferrous sulfate 325 (65 FE) MG tablet Take 325 mg by mouth daily with breakfast.    [provider]  furosemide  (LASIX) 40 MG tablet Take 1 tablet (40 mg total) by mouth 2 (two) times daily. 11/25/17   Demetrios Loll, MD  ipratropium-albuterol (DUONEB) 0.5-2.5 (3) MG/3ML SOLN Take 3 mLs by nebulization every 6 (six) hours as needed (shortness of breath). Patient taking differently: Take 3 mLs by nebulization every 6 (six) hours as needed (shortness of breath/ wheezing).  11/05/17   Gladstone Lighter, MD  levothyroxine (SYNTHROID, LEVOTHROID) 100 MCG tablet Take 1 tablet (100 mcg total) by mouth daily before breakfast. 11/06/17   Gladstone Lighter, MD  Turmeric 450 MG CAPS Take 450 mg by mouth daily.    [provider]     Results for orders placed or performed during the hospital encounter of 02/19/18 (from the past 48 hour(s))  Blood gas, venous     Status: Abnormal (Preliminary result)   Collection Time: 02/19/18  4:01 AM  Result Value Ref Range   pH, Ven 7.37 7.250 - 7.430   pCO2, Ven 120 (HH) 44.0 - 60.0 mmHg    Comment: NMDCR, [place]   pO2, Ven 56.0 (H) 32.0 - 45.0 mmHg   Bicarbonate PENDING 20.0 - 28.0 mmol/L   O2 Saturation PENDING %   Patient temperature 37.0    Collection site VEIN    Sample type VENOUS     Comment: Performed at [facility], [address] [place], [place] [address]  Comprehensive metabolic panel     Status: Abnormal  Collection Time: 02/19/18  4:01 AM  Result Value Ref Range   Sodium 144 135 - 145 mmol/L   Potassium 5.1 3.5 - 5.1 mmol/L    Comment: HEMOLYSIS AT THIS LEVEL MAY AFFECT RESULT   Chloride 81 (L) 98 - 111 mmol/L   CO2 45 (H) 22 - 32 mmol/L   Glucose, Bld 122 (H) 70 - 99 mg/dL   BUN 20 6 - 20 mg/dL   Creatinine, Ser 0.49 0.44 - 1.00 mg/dL   Calcium 8.6 (L) 8.9 - 10.3 mg/dL   Total Protein 6.5 6.5 - 8.1 g/dL   Albumin 3.4 (L) 3.5 - 5.0 g/dL   AST 40 15 - 41 U/L    Comment: HEMOLYSIS AT THIS LEVEL MAY AFFECT RESULT   ALT 28 0 - 44 U/L    Comment: HEMOLYSIS AT THIS LEVEL MAY AFFECT RESULT   Alkaline Phosphatase 71 38 - 126 U/L    Total Bilirubin 1.9 (H) 0.3 - 1.2 mg/dL    Comment: HEMOLYSIS AT THIS LEVEL MAY AFFECT RESULT   GFR calc non Af Amer >60 >60 mL/min   GFR calc Af Amer >60 >60 mL/min    Comment: (NOTE) The eGFR has been calculated using the CKD EPI equation. This calculation has not been validated in all clinical situations. eGFR's persistently <60 mL/min signify possible Chronic Kidney Disease.    Anion gap 18 (H) 5 - 15    Comment: Performed at [facility], [address] [place], [place] [address]  Troponin I - Once     Status: None   Collection Time: 02/19/18  4:01 AM  Result Value Ref Range   Troponin I <0.03 <0.03 ng/mL    Comment: Performed at [facility], [address] [place], [place] [address]  CBC with Differential     Status: Abnormal   Collection Time: 02/19/18  4:01 AM  Result Value Ref Range   WBC 6.5 4.0 - 10.5 K/uL   RBC 4.33 3.87 - 5.11 MIL/uL   Hemoglobin 13.4 12.0 - 15.0 g/dL   HCT 46.0 36.0 - 46.0 %   MCV 106.2 (H) 80.0 - 100.0 fL   MCH 30.9 26.0 - 34.0 pg   MCHC 29.1 (L) 30.0 - 36.0 g/dL   RDW 16.2 (H) 11.5 - 15.5 %   Platelets 169 150 - 400 K/uL   nRBC 0.0 0.0 - 0.2 %   Neutrophils Relative % 68 %   Neutro Abs 4.4 1.7 - 7.7 K/uL   Lymphocytes Relative 19 %   Lymphs Abs 1.3 0.7 - 4.0 K/uL   Monocytes Relative 9 %   Monocytes Absolute 0.6 0.1 - 1.0 K/uL   Eosinophils Relative 2 %   Eosinophils Absolute 0.1 0.0 - 0.5 K/uL   Basophils Relative 1 %   Basophils Absolute 0.0 0.0 - 0.1 K/uL   Immature Granulocytes 1 %   Abs Immature Granulocytes 0.09 (H) 0.00 - 0.07 K/uL    Comment: Performed at [facility], [address] [place], [place] [address]  Brain natriuretic peptide     Status: Abnormal   Collection Time: 02/19/18  4:02 AM  Result Value Ref Range   B Natriuretic Peptide 154.0 (H) 0.0 - 100.0 pg/mL    Comment: Performed at [facility], [address], [place], [place] [address]   Dg Chest Port 1 View  Result  Date: 02/19/2018 CLINICAL DATA:  Acute onset of respiratory distress. EXAM: PORTABLE CHEST 1 VIEW COMPARISON:  Chest radiograph performed  02/05/2018 FINDINGS: The lungs are well-aerated. Vascular congestion is noted. Patchy bilateral central airspace opacities may reflect pulmonary edema or possibly pneumonia. A small left pleural effusion is suspected. There is no evidence of pneumothorax. The cardiomediastinal silhouette is borderline enlarged. No acute osseous abnormalities are seen. IMPRESSION: Vascular congestion and borderline cardiomegaly. Patchy bilateral central airspace opacities may reflect pulmonary edema or possibly pneumonia. Suspect small left pleural effusion. Electronically Signed   By: Garald Balding M.D.   On: 02/19/2018 04:29    Review of Systems  Constitutional: Negative for chills and fever.  HENT: Negative for sore throat and tinnitus.   Eyes: Negative for blurred vision and redness.  Respiratory: Positive for shortness of breath. Negative for cough.   Cardiovascular: Negative for chest pain, palpitations, orthopnea and PND.  Gastrointestinal: Negative for abdominal pain, diarrhea, nausea and vomiting.  Genitourinary: Negative for dysuria, frequency and urgency.  Musculoskeletal: Positive for joint pain (Shoulder due to chronic dislocation). Negative for myalgias.  Skin: Negative for rash.       No lesions  Neurological: Positive for weakness. Negative for speech change and focal weakness.  Endo/Heme/Allergies: Does not bruise/bleed easily.       No temperature intolerance  Psychiatric/Behavioral: Negative for depression and suicidal ideas.    Blood pressure 107/68, pulse 75, temperature 97.9 F (36.6 C), temperature source Axillary, resp. rate (!) 25, height _0  (1.651 m), weight (!) 201 kg, SpO2 93 %. Physical Exam  Vitals reviewed. Constitutional: She is oriented to person, place, and time. She appears well-developed and well-nourished. No distress.  HENT:   Head: Normocephalic and atraumatic.  Mouth/Throat: Oropharynx is clear and moist.  Eyes: Pupils are equal, round, and reactive to light. Conjunctivae and EOM are normal. No scleral icterus.  Neck: Normal range of motion. Neck supple. No tracheal deviation present. No thyromegaly present.  Cardiovascular: Normal rate, regular rhythm and normal heart sounds. Exam reveals no gallop and no friction rub.  No murmur heard. Respiratory: Effort normal. She has wheezes.  GI: Soft. Bowel sounds are normal. She exhibits no distension. There is no tenderness.  Genitourinary:  Genitourinary Comments: Deferred  Musculoskeletal: Normal range of motion. She exhibits edema.  Lymphadenopathy:    She has no cervical adenopathy.  Neurological: She is alert and oriented to person, place, and time. No cranial nerve deficit. She exhibits normal muscle tone.  Skin: Skin is warm and dry. No rash noted. No erythema.  Psychiatric: She has a normal mood and affect. Her behavior is normal. Judgment and thought content normal.     Assessment/Plan This is a 57 year old female admitted for respiratory failure. 1.  Respiratory failure: Acute on chronic; with hypoxia and hypercapnia.   Continue BiPAP. The patient does not want intubation.  Also continue inhaled corticosteroid.  Schedule albuterol.  Continue ceftriaxone and azithromycin. 2.  COPD: The patient's PCO2 is dramatically elevated and surprisingly she is still conscious. Plan as above 3.  Hypertension: Controlled; continue carvedilol 4.  CHF: Acute on chronic; diastolic.  BNP is elevated.  Continue Lasix per home regimen 5.  DVT prophylaxis: Lovenox 6.  GI prophylaxis: None Patient is a DNR.  I personally spent 45 minutes with critical care time.  Harrie Foreman, MD 02/19/2018, 9:03 AM

## 2018-02-19 NOTE — ED Notes (Signed)
This RN attempted to call report to floor. Charge nurse informed this RN that receiving RN would call right back to take report.

## 2018-02-19 NOTE — ED Notes (Signed)
Pt moved to hospital bed for patient comfort. Pt noted to have pulled IV out of R hand, 20g to R hand replaced. Pt noted to have disconnected herself from BiPap, BiPap replaced, initial O2 sats 64%, O2 sats now 97% back on BiPap. Dr. Sheryle Hailiamond made aware. This RN and Pattricia BossAnnie, RN introduced selves to patient. Pt now resting comfortably in bed at this time.

## 2018-02-19 NOTE — ED Provider Notes (Signed)
[facility] Emergency Department Provider Note  ____________________________________________   First MD Initiated Contact with Patient 02/19/18 0354     (approximate)  I have reviewed the triage vital signs and the nursing notes.   HISTORY  Chief Complaint Respiratory Distress   HPI Rebecca Bird is a 57 y.o. female comes to the emergency department via EMS with shortness of breath and respiratory distress.  Patient symptoms began insidiously over the past 24 hours and have been slowly progressive.  They are now severe.  When EMS arrived at the patient's nursing home they noted that she was saturating 58% on her home CPAP.  They placed her on their own CPAP and her oxygen saturation increased to 96%.  They noted that her initial CO2 was about 90 and over the course of transport went down to 40.  She was given 1 g of magnesium along with 125 mg of Solu-Medrol and 1 DuoNeb in route.  The patient has a complex past medical history including COPD, morbid obesity with pickwickian syndrome, and diastolic dysfunction.  She denies fevers or chills.  She denies productive cough.  Her shortness of breath has been progressive now severe worse with exertion and worse off of oxygen and improved with positive pressure and oxygen.    Past Medical History:  Diagnosis Date  . Anemia   . CHF (congestive heart failure) (HCC)   . COPD (chronic obstructive pulmonary disease) (HCC)   . Depression   . Hypertension   . Obesity hypoventilation syndrome (HCC)   . Sleep apnea   . Thyroid disease     Patient Active Problem List   Diagnosis Date Noted  . Acute on chronic respiratory failure with hypoxia and hypercapnia (HCC) 02/19/2018  . Acute respiratory failure with hypoxia (HCC) 02/05/2018  . COPD with acute exacerbation (HCC) 12/27/2017  . HTN (hypertension) 12/27/2017  . Hypothyroidism 12/27/2017  . OSA (obstructive sleep apnea)   . Obesity hypoventilation syndrome (HCC)     . Palliative care by specialist   . Goals of care, counseling/discussion   . Acute on chronic respiratory failure with hypoxemia (HCC) 11/01/2017    Past Surgical History:  Procedure Laterality Date  . ROTATOR CUFF REPAIR      Prior to Admission medications   Medication Sig Start Date End Date Taking? Authorizing Provider  budesonide (PULMICORT) 0.25 MG/2ML nebulizer solution Take 2 mLs (0.25 mg total) by nebulization 2 (two) times daily. 01/01/18   Altamese DillingVachhani, Vaibhavkumar, MD  carvedilol (COREG) 3.125 MG tablet Take 3.125 mg by mouth 2 (two) times daily with a meal.    [provider]  citalopram (CELEXA) 40 MG tablet Take 40 mg by mouth daily.     [provider]  ferrous sulfate 325 (65 FE) MG tablet Take 325 mg by mouth daily with breakfast.    [provider]  furosemide (LASIX) 40 MG tablet Take 1 tablet (40 mg total) by mouth 2 (two) times daily. 11/25/17   Shaune Pollackhen, Qing, MD  ipratropium-albuterol (DUONEB) 0.5-2.5 (3) MG/3ML SOLN Take 3 mLs by nebulization every 6 (six) hours as needed (shortness of breath). Patient taking differently: Take 3 mLs by nebulization every 6 (six) hours as needed (shortness of breath/ wheezing).  11/05/17   Enid BaasKalisetti, Radhika, MD  levothyroxine (SYNTHROID, LEVOTHROID) 100 MCG tablet Take 1 tablet (100 mcg total) by mouth daily before breakfast. 11/06/17   Enid BaasKalisetti, Radhika, MD  Turmeric 450 MG CAPS Take 450 mg by mouth daily.  [provider]    Allergies Levaquin [levofloxacin in d5w]  Family History  Problem Relation Age of Onset  . Hypertension Mother     Social History Social History   Tobacco Use  . Smoking status: Former Smoker    Years: 10.00  . Smokeless tobacco: Never Used  Substance Use Topics  . Alcohol use: Not Currently  . Drug use: Not Currently    Review of Systems Constitutional: No fever/chills Eyes: No visual changes. ENT: No sore throat. Cardiovascular: Positive for chest  pain. Respiratory: Positive for shortness of breath. Gastrointestinal: No abdominal pain.  No nausea, no vomiting.  No diarrhea.  No constipation. Genitourinary: Negative for dysuria. Musculoskeletal: Negative for back pain. Skin: Negative for rash. Neurological: Negative for headaches, focal weakness or numbness.   ____________________________________________   PHYSICAL EXAM:  VITAL SIGNS: ED Triage Vitals  Enc Vitals Group     BP      Pulse      Resp      Temp      Temp src      SpO2      Weight      Height      Head Circumference      Peak Flow      Pain Score      Pain Loc      Pain Edu?      Excl. in GC?     Constitutional: Alert and oriented x4.  Morbidly obese and pickwickian and in mild to moderate respiratory distress Eyes: PERRL EOMI. Head: Atraumatic. Nose: No congestion/rhinnorhea. Mouth/Throat: No trismus Neck: No stridor.   Cardiovascular: Normal rate, regular rhythm. Grossly normal heart sounds.  Good peripheral circulation. Respiratory: Mild to moderate respiratory distress although surprisingly comfortable on BiPAP.  Diffuse inspiratory expiratory wheezing in all fields Gastrointestinal: Morbidly obese Musculoskeletal: Legs are equal in size Neurologic:  Normal speech and language. No gross focal neurologic deficits are appreciated. Skin:  Skin is warm, dry and intact. No rash noted. Psychiatric: Mood and affect are normal. Speech and behavior are normal.    ____________________________________________   DIFFERENTIAL includes but not limited to  Hypercapnia, COPD exacerbation, pneumonia, pneumothorax, pulmonary embolism, pulmonary edema ____________________________________________   LABS (all labs ordered are listed, but only abnormal results are displayed)  Labs Reviewed  BLOOD GAS, VENOUS - Abnormal; Notable for the following components:      Result Value   pCO2, Ven 120 (*)    pO2, Ven 56.0 (*)    All other components within normal  limits  COMPREHENSIVE METABOLIC PANEL - Abnormal; Notable for the following components:   Chloride 81 (*)    CO2 45 (*)    Glucose, Bld 122 (*)    Calcium 8.6 (*)    Albumin 3.4 (*)    Total Bilirubin 1.9 (*)    Anion gap 18 (*)    All other components within normal limits  BRAIN NATRIURETIC PEPTIDE - Abnormal; Notable for the following components:   B Natriuretic Peptide 154.0 (*)    All other components within normal limits  CBC WITH DIFFERENTIAL/PLATELET - Abnormal; Notable for the following components:   MCV 106.2 (*)    MCHC 29.1 (*)    RDW 16.2 (*)    Abs Immature Granulocytes 0.09 (*)    All other components within normal limits  TROPONIN I    Lab work reviewed by me shows an extremely elevated PCO2.  High serum bicarbonate likely chronic compensation from prolonged elevated PCO2 __________________________________________  EKG   ____________________________________________  RADIOLOGY  Chest x-ray reviewed by me concerning for possible pneumonia ____________________________________________   PROCEDURES  Procedure(s) performed: no  .Critical Care Performed by: Merrily Brittle, MD Authorized by: Merrily Brittle, MD   Critical care provider statement:    Critical care time (minutes):  35   Critical care time was exclusive of:  Separately billable procedures and treating other patients   Critical care was necessary to treat or prevent imminent or life-threatening deterioration of the following conditions:  Respiratory failure   Critical care was time spent personally by me on the following activities:  Development of treatment plan with patient or surrogate, discussions with consultants, evaluation of patient's response to treatment, examination of patient, obtaining history from patient or surrogate, ordering and performing treatments and interventions, ordering and review of laboratory studies, ordering and review of radiographic studies, pulse oximetry,  re-evaluation of patient's condition and review of old charts    Critical Care performed: Yes  ____________________________________________   INITIAL IMPRESSION / ASSESSMENT AND PLAN / ED COURSE  Pertinent labs & imaging results that were available during my care of the patient were reviewed by me and considered in my medical decision making (see chart for details).   As part of my medical decision making, I reviewed the following data within the electronic MEDICAL RECORD NUMBER History obtained from family if available, nursing notes, old chart and ekg, as well as notes from prior ED visits.  The patient comes to the emergency department requiring BiPAP for respiratory support.  While transitioning her from the EMS BiPAP 2 hours she desaturated to the 50s extremely quickly.  Her lungs sound extremely wheezy so in addition to the pressure support we will give her an additional DuoNeb and then 15 mg of albuterol.  We will also give her a dose of a azithromycin as this is at the minimum COPD exacerbation.  Broad lab work including a venous gas are pending.  The patient's VBG shows an astonishingly high PCO2.  I discussed with the patient the importance of positive pressure ventilation to help her breathe off the CO2 and she emphasized to me that under no circumstances would she want to be intubated.  I think this is reasonable at this point as she is tolerating the BiPAP without issues.  Her chest x-ray is actually concerning for possible pneumonia so we will add on ceftriaxone for strep pneumo coverage and she will require inpatient admission.  Discussed with the hospitalist Dr. Sheryle Hail who has graciously agreed to admit the patient to his service.      ____________________________________________   FINAL CLINICAL IMPRESSION(S) / ED DIAGNOSES  Final diagnoses:  COPD exacerbation (HCC)  Acute respiratory failure with hypoxia and hypercarbia (HCC)  Community acquired pneumonia, unspecified  laterality      NEW MEDICATIONS STARTED DURING THIS VISIT:  New Prescriptions   No medications on file     Note:  This document was prepared using Dragon voice recognition software and may include unintentional dictation errors.     Merrily Brittle, MD 02/19/18 [phone]

## 2018-02-19 NOTE — Progress Notes (Signed)
Anticoagulation monitoring(Lovenox):  57yo  female ordered Lovenox 40 mg Q24h for DVT prevention  Filed Weights   02/19/18 0359 02/19/18 0946  Weight: (!) 443 lb 2 oz (201 kg) (!) 416 lb 0.1 oz (188.7 kg)   BMI 67   Lab Results  Component Value Date   CREATININE 0.49 02/19/2018   CREATININE 0.63 02/06/2018   CREATININE 0.71 02/05/2018   Estimated Creatinine Clearance: 136.1 mL/min (by C-G formula based on SCr of 0.49 mg/dL). Hemoglobin & Hematocrit     Component Value Date/Time   HGB 13.4 02/19/2018 0401   HCT 46.0 02/19/2018 0401     Per Protocol for Patient with estCrcl > 30 ml/min and BMI > 40, will transition to Lovenox 40 mg Q12h.     Clovia CuffLisa Equan Cogbill, PharmD, BCPS 02/19/2018 8:16 PM

## 2018-02-19 NOTE — Progress Notes (Signed)
Patient seen and examined.  Admitted for acute on chronic hypoxic respiratory failure currently off BiPAP.  Reviewed H&P, lab work and current medications.  We will continue the same. Patient has poor prognosis with a DNR/DNI status

## 2018-02-19 NOTE — Consult Note (Signed)
Name: Rebecca Bird MRN: [id number] DOB: 11-16-1960     CONSULTATION DATE: 11.17.19 REFERRING MD : diamond  CHIEF COMPLAINT: SOB  STUDIES:     CXR independently reviewed by Me today B/l infiltrates pulmonary edema    HISTORY OF PRESENT ILLNESS:  57 yo morbidly obese WF admitted to ICU for acute on chronic resp failure Patient is alert and awake, follows commands On biPAP  Has had multiple admission for resp distress Patient is DNR/DNI  Poor prognosis  Based on ABG findings, her baseline PCO2 is 100-110 Will wean off biPAP   she has OSA/OHS, COPD, CHF   PAST MEDICAL HISTORY :   has a past medical history of Anemia, CHF (congestive heart failure) (HCC), COPD (chronic obstructive pulmonary disease) (HCC), Depression, Hypertension, Obesity hypoventilation syndrome (HCC), Sleep apnea, and Thyroid disease.  has a past surgical history that includes Rotator cuff repair. Prior to Admission medications   Medication Sig Start Date End Date Taking? Authorizing Provider  budesonide (PULMICORT) 0.25 MG/2ML nebulizer solution Take 2 mLs (0.25 mg total) by nebulization 2 (two) times daily. 01/01/18   Altamese DillingVachhani, Vaibhavkumar, MD  carvedilol (COREG) 3.125 MG tablet Take 3.125 mg by mouth 2 (two) times daily with a meal.    [provider]  citalopram (CELEXA) 40 MG tablet Take 40 mg by mouth daily.     [provider]  ferrous sulfate 325 (65 FE) MG tablet Take 325 mg by mouth daily with breakfast.    [provider]  furosemide (LASIX) 40 MG tablet Take 1 tablet (40 mg total) by mouth 2 (two) times daily. 11/25/17   Shaune Pollackhen, Qing, MD  ipratropium-albuterol (DUONEB) 0.5-2.5 (3) MG/3ML SOLN Take 3 mLs by nebulization every 6 (six) hours as needed (shortness of breath). Patient taking differently: Take 3 mLs by nebulization every 6 (six) hours as needed (shortness of breath/ wheezing).  11/05/17   Enid BaasKalisetti, Radhika, MD  levothyroxine (SYNTHROID, LEVOTHROID) 100 MCG  tablet Take 1 tablet (100 mcg total) by mouth daily before breakfast. 11/06/17   Enid BaasKalisetti, Radhika, MD  Turmeric 450 MG CAPS Take 450 mg by mouth daily.    [provider]   Allergies  Allergen Reactions  . Levaquin [Levofloxacin In D5w] Itching and Rash    FAMILY HISTORY:  family history includes Hypertension in her mother. SOCIAL HISTORY:  reports that she has quit smoking. She quit after 10.00 years of use. She has never used smokeless tobacco. She reports that she drank alcohol. She reports that she has current or past drug history.  REVIEW OF SYSTEMS:   Constitutional: Negative for fever, chills, weight loss, malaise/fatigue and diaphoresis.  HENT: Negative for hearing loss, ear pain, nosebleeds, congestion, sore throat, neck pain, tinnitus and ear discharge.   Eyes: Negative for blurred vision, double vision, photophobia, pain, discharge and redness.  Respiratory: +SOB Cardiovascular: Negative for chest pain, palpitations, orthopnea, claudication, +leg swelling and PND.  Gastrointestinal: Negative for heartburn, nausea, vomiting, abdominal pain, diarrhea, constipation, blood in stool and melena.  Genitourinary: Negative for dysuria, urgency, frequency, hematuria and flank pain.  Musculoskeletal: Negative for myalgias, back pain, joint pain and falls.  Skin: Negative for itching and rash.  Neurological: Negative for dizziness, tingling, tremors, sensory change, speech change, focal weakness, seizures, loss of consciousness, weakness and headaches.  Endo/Heme/Allergies: Negative for environmental allergies and polydipsia. Does not bruise/bleed easily.  ALL OTHER ROS ARE NEGATIVE    VITAL SIGNS: Temp:  [97.9 F (36.6 C)] 97.9 F (36.6 C) (11/17 0358)  Pulse Rate:  [73-90] 84 (11/17 0915) Resp:  [18-27] 23 (11/17 0915) BP: (107-148)/(58-91) 138/77 (11/17 0915) SpO2:  [84 %-97 %] 92 % (11/17 0915) Weight:  [201 kg] 201 kg (11/17 0359)  Physical Examination:    GENERAL:minimal resp distress HEAD: Normocephalic, atraumatic.  EYES: Pupils equal, round, reactive to light. Extraocular muscles intact. No scleral icterus.  MOUTH: Moist mucosal membrane.   EAR, NOSE, THROAT: Clear without exudates. No external lesions.  NECK: Supple. No thyromegaly. No nodules. No JVD.  PULMONARY:CTA B/L no wheezes, no crackles, no rhonchi CARDIOVASCULAR: S1 and S2. Regular rate and rhythm. No murmurs, rubs, or gallops. No edema.  GASTROINTESTINAL: Soft, nontender, nondistended. No masses. Positive bowel sounds.  MUSCULOSKELETAL: +edema. Range of motion full in all extremities.  NEUROLOGIC: Cranial nerves II through XII are intact. No gross focal neurological deficits.  SKIN: No ulceration, lesions, rashes, or cyanosis. Skin warm and dry. Turgor intact.  PSYCHIATRIC: Mood, affect within normal limits. The patient is awake, alert and oriented x 3. Insight, judgment intact.      ASSESSMENT / PLAN:  57 yo morbidly obese white female with acute on chronic resp failure from acute pulm edema from acute systolic CHF with COPD exacerbation and underlying OSA/OHS  1.lasix as tolerated 2.stop ABX 3.wean off biPAP 4. Oxygen as needed  Patient is DNR/DNI  Recommend LTACH REFERRAL  Patient  satisfied with Plan of action and management. All questions answered  Lucie Leather, M.D.  [facility]  Medical Director [facility] Medical Director [facility] Cardio-Pulmonary Department

## 2018-02-20 LAB — BASIC METABOLIC PANEL
BUN: 28 mg/dL — AB (ref 6–20)
CO2: >50 mmol/L — ABNORMAL HIGH (ref 22–32)
CREATININE: 0.65 mg/dL (ref 0.44–1.00)
Calcium: 8.7 mg/dL — ABNORMAL LOW (ref 8.9–10.3)
Chloride: 80 mmol/L — ABNORMAL LOW (ref 98–111)
GFR calc Af Amer: >60 mL/min (ref >60)
GLUCOSE: 112 mg/dL — AB (ref 70–99)
POTASSIUM: 3.7 mmol/L (ref 3.5–5.1)
Sodium: 145 mmol/L (ref 135–145)

## 2018-02-20 LAB — BLOOD GAS, ARTERIAL
FIO2: 0.36
PATIENT TEMPERATURE: 37
PO2 ART: 68 mmHg — AB (ref 83.0–108.0)
pCO2 arterial: 120 mmHg (ref 32.0–48.0)
pH, Arterial: 7.32 — ABNORMAL LOW (ref 7.350–7.450)

## 2018-02-20 LAB — PHOSPHORUS: Phosphorus: 4.7 mg/dL — ABNORMAL HIGH (ref 2.5–4.6)

## 2018-02-20 LAB — MAGNESIUM: Magnesium: 2 mg/dL (ref 1.7–2.4)

## 2018-02-20 MED ORDER — BUDESONIDE 0.25 MG/2ML IN SUSP
0.5000 mg | Freq: Two times a day (BID) | RESPIRATORY_TRACT | Status: DC
  Administered 2018-02-20 – 2018-02-22 (×4): 0.5 mg via RESPIRATORY_TRACT
  Filled 2018-02-20 (×3): qty 4

## 2018-02-20 NOTE — Progress Notes (Signed)
Please note patient is currently followed by outpatient PALLIATIVE at [facility]. CSW LoaMonica Marra and CMRN Robbie LisJeanna Creech made aware. Hospice palliative team notified as well. Thank you. Dayna BarkerKaren Robertson RN, BSN, [facility], hospital Liaison [phone]

## 2018-02-20 NOTE — Progress Notes (Signed)
Chaplain responded to an OR for support and prayer. Daughter and son-in-law was at the bedside. Chaplain remembered this Pt from a previous visit. Pt is lively and enjoys a visit. Chaplain practiced active listing and pastoral presence. Chaplain purchased oil and annointed  pt head. Daughter continued rubbing her head as she said it felt good.    02/20/18 1300  Clinical Encounter Type  Visited With Patient and family together  Visit Type Spiritual support  Referral From Nurse  Spiritual Encounters  Spiritual Needs Prayer;Emotional

## 2018-02-20 NOTE — Progress Notes (Signed)
Advanced Care Plan.  Purpose of Encounter: Palliative care and hospice care. Parties in Attendance: The patient, her daughter, son-in-law and me. Patient's Decisional Capacity: Yes. Medical Story: Rebecca Bird is an 57 y.o. female with history of anemia, CHF, COPD, hypertension, sleep apnea, morbid obesity with hypoventilation syndrome, thyroid disease and depression.  The patient has recurrent admission for acute on chronic respiratory failure.  She is admitted to ICU for acute on chronic respiratory failure with hypoxia and hypercapnia due to COPD exacerbation and hypoventilation syndrome this time.  I discussed with patient and her daughter about her current condition, poor prognosis, palliative care and hospice care.  They understand and want to have a meeting with palliative care team and prefer to get hospice care. Goals of Care Determinations: Palliative care/hospice care. Plan:  Code Status: DNR. Time spent discussing advance care planning: 20 minutes.

## 2018-02-20 NOTE — Progress Notes (Addendum)
[employer] [facility] [place] [facility]   PATIENT NAME: Rebecca Bird    MR#:  [id number]  DATE OF BIRTH:  [birth year]  SUBJECTIVE:  CHIEF COMPLAINT:   Chief Complaint  Patient presents with  . Respiratory Distress   Patient is off BiPAP now, on oxygen by nasal cannula on 5 L.  She complains of shortness of breath but denies other symptoms. REVIEW OF SYSTEMS:  Review of Systems  Constitutional: Negative for chills, fever and malaise/fatigue.  HENT: Negative for sore throat.   Eyes: Negative for blurred vision and double vision.  Respiratory: Positive for shortness of breath. Negative for cough, hemoptysis, wheezing and stridor.   Cardiovascular: Negative for chest pain, palpitations, orthopnea and leg swelling.  Gastrointestinal: Negative for abdominal pain, blood in stool, diarrhea, melena, nausea and vomiting.  Genitourinary: Negative for dysuria, flank pain and hematuria.  Musculoskeletal: Negative for back pain and joint pain.  Skin: Negative for rash.  Neurological: Negative for dizziness, sensory change, focal weakness, seizures, loss of consciousness, weakness and headaches.  Endo/Heme/Allergies: Negative for polydipsia.  Psychiatric/Behavioral: Negative for depression. The patient is not nervous/anxious.     DRUG ALLERGIES:   Allergies  Allergen Reactions  . Levaquin [Levofloxacin In D5w] Itching and Rash   VITALS:  Blood pressure 119/66, pulse 83, temperature 99.1 F (37.3 C), temperature source Oral, resp. rate (!) 28, height 5\' 6"  (1.676 m), weight (!) 188.7 kg, SpO2 95 %. PHYSICAL EXAMINATION:  Physical Exam  Constitutional: She is oriented to person, place, and time. No distress.  Morbid obesity.  HENT:  Head: Normocephalic.  Mouth/Throat: Oropharynx is clear and moist.  Eyes: Pupils are equal, round, and reactive to light. Conjunctivae and EOM are normal. No scleral icterus.  Neck: Normal range of motion. Neck supple. No JVD present. No  tracheal deviation present.  Cardiovascular: Normal rate, regular rhythm and normal heart sounds. Exam reveals no gallop.  No murmur heard. Pulmonary/Chest: Effort normal. No respiratory distress. She has no wheezes. She has no rales.  Diminished breath sounds  Abdominal: Soft. Bowel sounds are normal. She exhibits no distension. There is no tenderness. There is no rebound.  Musculoskeletal: Normal range of motion. She exhibits no edema or tenderness.  Neurological: She is alert and oriented to person, place, and time. No cranial nerve deficit.  Skin: No rash noted. No erythema.  Psychiatric: She has a normal mood and affect.   LABORATORY PANEL:  Female CBC Recent Labs  Lab 02/19/18 0401  WBC 6.5  HGB 13.4  HCT 46.0  PLT 169   ------------------------------------------------------------------------------------------------------------------ Chemistries  Recent Labs  Lab 02/19/18 0401 02/20/18 0614  NA 144 145  K 5.1 3.7  CL 81* 80*  CO2 45* >50*  GLUCOSE 122* 112*  BUN 20 28*  CREATININE 0.49 0.65  CALCIUM 8.6* 8.7*  MG  --  2.0  AST 40  --   ALT 28  --   ALKPHOS 71  --   BILITOT 1.9*  --    RADIOLOGY:  No results found. ASSESSMENT AND PLAN:   This is a 57 year old female admitted for respiratory failure.  1.    Acute on chronic respiratory failure with hypoxia and hypercapnia. Off BiPAP now. On O2 [place] 5 L.  She is on BiPAP as needed in skilled nursing facility. Continue inhaled corticosteroid.  Schedule albuterol.  She was given ceftriaxone and azithromycin.  2.  COPD exacerbation: The patient's PCO2 is dramatically elevated and surprisingly she is still conscious.  PCO2  120. Treatment as above.  3.  Hypertension: Controlled; continue carvedilol 4.  CHF: Acute on chronic; diastolic.  Continue Lasix per home regimen.  Hypertension.  Controlled.  Morbid obesity and OSA, hypoventilation syndrome.  BiPAP was PRN.  Poor prognosis.  Waiting for palliative care  consult, may need hospice care. Discussed with palliative care staff. All the records are reviewed and case discussed with Care Management/Social Worker. Management plans discussed with the patient, her daughter and they are in agreement.  CODE STATUS: DNR  TOTAL TIME TAKING CARE OF THIS PATIENT: 28 minutes.   More than 50% of the time was spent in counseling/coordination of care: YES  POSSIBLE D/C IN 3 DAYS, DEPENDING ON CLINICAL CONDITION.   Shaune Pollack M.D on 02/20/2018 at 4:09 PM  Between 7am to 6pm - Pager - [phone]  After 6pm go to [email or web address] - Therapist, nutritional Hospitalists

## 2018-02-20 NOTE — Progress Notes (Signed)
Name: Rebecca Bird MRN: [id number] DOB: 12/22/1960     CONSULTATION DATE: 02/19/2018  Subjective & objectives: Tolerating nasal cannula and has been off BiPAP.  PAST MEDICAL HISTORY :   has a past medical history of Anemia, CHF (congestive heart failure) (HCC), COPD (chronic obstructive pulmonary disease) (HCC), Depression, Hypertension, Obesity hypoventilation syndrome (HCC), Sleep apnea, and Thyroid disease.  has a past surgical history that includes Rotator cuff repair. Prior to Admission medications   Medication Sig Start Date End Date Taking? Authorizing Provider  budesonide (PULMICORT) 0.25 MG/2ML nebulizer solution Take 2 mLs (0.25 mg total) by nebulization 2 (two) times daily. 01/01/18  Yes Altamese DillingVachhani, Vaibhavkumar, MD  carvedilol (COREG) 3.125 MG tablet Take 3.125 mg by mouth 2 (two) times daily with a meal.   Yes [provider]  citalopram (CELEXA) 40 MG tablet Take 40 mg by mouth daily.    Yes [provider]  ferrous sulfate 325 (65 FE) MG tablet Take 325 mg by mouth daily with breakfast.   Yes [provider]  furosemide (LASIX) 40 MG tablet Take 1 tablet (40 mg total) by mouth 2 (two) times daily. Patient taking differently: Take 80 mg by mouth 3 (three) times daily.  11/25/17  Yes Shaune Pollackhen, Qing, MD  ipratropium-albuterol (DUONEB) 0.5-2.5 (3) MG/3ML SOLN Take 3 mLs by nebulization every 6 (six) hours as needed (shortness of breath). Patient taking differently: Take 3 mLs by nebulization every 6 (six) hours as needed (shortness of breath/ wheezing).  11/05/17  Yes Enid BaasKalisetti, Radhika, MD  levothyroxine (SYNTHROID, LEVOTHROID) 100 MCG tablet Take 1 tablet (100 mcg total) by mouth daily before breakfast. 11/06/17  Yes Enid BaasKalisetti, Radhika, MD  Turmeric 450 MG CAPS Take 450 mg by mouth daily.   Yes [provider]   Allergies  Allergen Reactions  . Levaquin [Levofloxacin In D5w] Itching and Rash    FAMILY HISTORY:  family history includes  Hypertension in her mother. SOCIAL HISTORY:  reports that she has quit smoking. She quit after 10.00 years of use. She has never used smokeless tobacco. She reports that she drank alcohol. She reports that she has current or past drug history.  REVIEW OF SYSTEMS:   Unable to obtain due to critical illness   VITAL SIGNS: Temp:  [98.9 F (37.2 C)-99.5 F (37.5 C)] 99.1 F (37.3 C) (11/17 2000) Pulse Rate:  [73-95] 79 (11/18 0600) Resp:  [13-30] 23 (11/18 0600) BP: (98-138)/(53-95) 134/70 (11/18 0600) SpO2:  [86 %-99 %] 95 % (11/18 0600) Weight:  [188.7 kg] 188.7 kg (11/17 0946)  Physical Examination:  Somnolent, verbally arousable, no focal neurological deficits Tolerating nasal cannula, no distress, able to talk in full sentences, bilateral equal air entry and no adventitious sounds S1 & S2 are audible with no murmur Benign abdomen with feeble peristalsis No leg edema   ASSESSMENT / PLAN:  Acute on chronic respiratory failure with baseline obesity hypoventilation syndrome and obstructive sleep apnea.  Off BiPAP and tolerating nasal cannula 4-5 L/min -Monitor ABG and work of breathing -Nocturnal CPAP  Acute exacerbation of COPD -Bronchodilators of + inhaled steroids  Atelectasis.  Bibasilar airspace disease right > left.  MRSA PCR negative and procalcitonin less than 0.1 -Monitor CXR + CBC + FiO2. -Incentive spirometer  Prerenal azotemia, hypochloremia, contraction alkalosis due to diuresis on top of compensated chronic respiratory failure. -Optimize hydration/diuresis, monitor renal panel and urine output  Hypertension -Optimize antihypertensives and monitor hemodynamics  Hypothyroidism -Optimize levothyroxine and monitor free T4  DNR  DVT &  GI prophylaxis.  Continue with supportive care  Critical care time 40 minutes

## 2018-02-20 NOTE — Care Management Note (Signed)
Case Management Note  Patient Details  Name: Rebecca Bird MRN: [id number] Date of Birth: 13-Jun-1960  Subjective/Objective:     Patient admitted with acute on chronic respiratory failure.  Patient has end stage CHF and COPD.  Daughter Rebecca Bird is at the bedside with the patient.  Patient was admitted from [facility] and has has multiple recurrent admissions for the same thing over the past 6 months.  Patient has been living at [facility] since July 2019, before that she was living at home by herself.  She no longer has a house it was foreclosed.  [place] and [place].  The patient and family are  leaning toward hospice care and [place], the daughter would rather the patient go to a hospice home than have hospice at her home.  Palliative consult has been placed.  Daughter reports that she just wants her mother to be comfortable and she states she promised this for her mother.  I briefly mentioned NIV, patient reports that she does not want this, and reports that she has not been wearing her bipap at night because it is so uncomfortable.   RNCM will cont to follow for updates in GOC and discharge planning.   Robbie LisJeanna Audwin Semper RN BSN [phone]                Action/Plan:   Expected Discharge Date:                  Expected Discharge Plan:     In-House Referral:     Discharge planning Services  CM Consult  Post Acute Care Choice:    Choice offered to:     DME Arranged:    DME Agency:     HH Arranged:    HH Agency:     Status of Service:  In process, will continue to follow  If discussed at Long Length of Stay Meetings, dates discussed:    Additional Comments:  Allayne ButcherJeanna M Shyla Gayheart, RN 02/20/2018, 12:28 PM

## 2018-02-21 ENCOUNTER — Inpatient Hospital Stay: Payer: Medicare Other

## 2018-02-21 DIAGNOSIS — J9622 Acute and chronic respiratory failure with hypercapnia: Secondary | ICD-10-CM

## 2018-02-21 DIAGNOSIS — J9601 Acute respiratory failure with hypoxia: Secondary | ICD-10-CM

## 2018-02-21 DIAGNOSIS — J189 Pneumonia, unspecified organism: Secondary | ICD-10-CM

## 2018-02-21 DIAGNOSIS — J441 Chronic obstructive pulmonary disease with (acute) exacerbation: Secondary | ICD-10-CM

## 2018-02-21 DIAGNOSIS — J9621 Acute and chronic respiratory failure with hypoxia: Principal | ICD-10-CM

## 2018-02-21 DIAGNOSIS — Z7189 Other specified counseling: Secondary | ICD-10-CM

## 2018-02-21 DIAGNOSIS — J9811 Atelectasis: Secondary | ICD-10-CM

## 2018-02-21 DIAGNOSIS — Z515 Encounter for palliative care: Secondary | ICD-10-CM

## 2018-02-21 DIAGNOSIS — J9602 Acute respiratory failure with hypercapnia: Secondary | ICD-10-CM

## 2018-02-21 DIAGNOSIS — Z66 Do not resuscitate: Secondary | ICD-10-CM

## 2018-02-21 LAB — BASIC METABOLIC PANEL
BUN: 22 mg/dL — ABNORMAL HIGH (ref 6–20)
Calcium: 8.9 mg/dL (ref 8.9–10.3)
Chloride: 78 mmol/L — ABNORMAL LOW (ref 98–111)
Creatinine, Ser: 0.47 mg/dL (ref 0.44–1.00)
Glucose, Bld: 110 mg/dL — ABNORMAL HIGH (ref 70–99)
POTASSIUM: 4.3 mmol/L (ref 3.5–5.1)
Sodium: 147 mmol/L — ABNORMAL HIGH (ref 135–145)

## 2018-02-21 LAB — T4, FREE: Free T4: 0.85 ng/dL (ref 0.82–1.77)

## 2018-02-21 LAB — PHOSPHORUS: PHOSPHORUS: 4 mg/dL (ref 2.5–4.6)

## 2018-02-21 LAB — MAGNESIUM: Magnesium: 1.9 mg/dL (ref 1.7–2.4)

## 2018-02-21 MED ORDER — TRAZODONE HCL 50 MG PO TABS
50.0000 mg | ORAL_TABLET | Freq: Every evening | ORAL | Status: DC | PRN
  Administered 2018-02-21: 50 mg via ORAL
  Filled 2018-02-21: qty 1

## 2018-02-21 NOTE — Care Management Note (Signed)
Case Management Note  Patient Details  Name: Manley Masonammy G Nicolini MRN: [id number] Date of Birth: 12/22/1960  Subjective/Objective:         RNCM following up on discharge planning.  Patient and daughter have spoken with palliative care team and would like to go [facility] with palliative.  Dayna BarkerKaren Robertson with [facility] made aware.  Patient's daughter states that they would [facility] until her SNF days are up or if her health declines severely then she would like to [place].  [facility].  Robbie LisJeanna Charae Depaolis RN BSN [phone]             Action/Plan:   Expected Discharge Date:                  Expected Discharge Plan:     In-House Referral:     Discharge planning Services  CM Consult  Post Acute Care Choice:    Choice offered to:     DME Arranged:    DME Agency:     HH Arranged:    HH Agency:     Status of Service:  In process, will continue to follow  If discussed at Long Length of Stay Meetings, dates discussed:    Additional Comments:  Allayne ButcherJeanna M Prestin Munch, RN 02/21/2018, 12:12 PM

## 2018-02-21 NOTE — Consult Note (Signed)
Consultation Note Date: 02/21/2018   Patient Name: Rebecca Bird  DOB: Sep 02, 1960  MRN: [id number]  Age / Sex: 57 y.o., female  PCP: Donato Schultz, MD Referring Physician: Demetrios Loll, MD  Reason for Consultation: Establishing goals of care  HPI/Patient Profile: 57 y.o. female admitted on 02/19/2018 from [facility] with complaints of worsening shortness of breath. She has a past medical history of obesity hypoventilation syndrome, sleep apnea, congestive heart failure, COPD, depression, hypertension, and thyroid disease.  Patient was admitted a week ago with similar presentation requiring BiPAP and IV steroids.  He was discharged to facility with orders for oxygen via nasal cannula during the daytime and BiPAP at bedtime and oral Lasix.  She continues to have frequent admissions for respiratory distress.  During her ED course patient reported worsening dyspnea.  At the facility she was found to have oxygen saturations of 58%.  She was transported to the ED via EMS with CPAP support.  Patient received Solu-Medrol 125 mg and magnesium 1 g by EMS team during transport.  Patient was transitioned to BiPAP during her ED course.  Patient was alert and oriented x3.  ABG showed PCO2 120, PO2 56, potassium 5.1, glucose 122, albumin 3.4, troponin 0.03, BNP 154, WBC 6.5, TSH 2.747.  Chest x-ray showed vascular congestion with borderline cardiomegaly, patchy bilateral airspace opacities which may reflect pulmonary edema or possible pneumonia, suspect small left pleural effusion.  Since admission patient is receiving inhaled corticosteroids, scheduled albuterol, ceftriaxone, azithromycin, and continues on home medications.  She is obtaining oxygen saturations during the day via nasal cannula 5 L with BiPAP use at bedtime.  Palliative medicine team consulted for goals of care discussion.  Clinical Assessment and Goals of  Care: I have reviewed medical records including lab results, imaging, Epic notes, and MAR, received report from the bedside RN, and assessed the patient. I then met at the bedside with the patient, her daughter, Ivan Croft and her son-in-law  to discuss diagnosis prognosis, GOC, EOL wishes, disposition and options.  Patient is alert and oriented x3.  She was able to engage in goals of care discussion with her family and I.  I re-introduced Palliative Medicine as specialized medical care for people living with serious illness. It focuses on providing relief from the symptoms and stress of a serious illness. The goal is to improve quality of life for both the patient and the family.  Patient is well-known to our palliative team as we have been involved in her care during her frequent admissions.   Patient reports prior to this admission she continues to reside at [facility] for rehabilitation with plans to transition to long-term care.  Patient states prior to admission she was participating in rehab at least 2-3 times per day.  She states her appetite remains good.  Her daughter reports her frustration with staff at times due to limitations on her diet.  Patient reports she gets somewhat short of breath with exertion however she was able to sit on  the side of the bed most of the time with assistance.  Continues to require assistance with ADLs.  Patient and family verbalizes concerns at the facility with not placing on BiPAP when she is napping during the day and continuity of care. I attempted to discuss usage of BiPAP and if patient feels short of breath to continue contacting the nurse in those situations and making a request to have placed. Patient and daughter verbalized understanding.   We discussed her current illness and what it means in the larger context of her on-going co-morbidities.  Natural disease trajectory and expectations at EOL were discussed.  Since patient's current  illness, with a direct focus on her frequent hospitalizations, respiratory distress, CHF exacerbation, and overall decrease in functional status.  Patient and family verbalized understanding.  I educated patient on her trajectory and effects that multiple hospitalizations have on her chances of any signs of improvement and how it also contributes to her decline.  Both patient and family again verbalized understanding.  Patient expressed she is tired of coming back and forth to the hospital.  I attempted to elicit values and goals of care important to the patient.    The difference between aggressive medical intervention and comfort care was considered in light of the patient's goals of care.  This time patient is requesting to continue to treat the treatable with limitations to her care.  She reports she is not prepared to shift care to comfort however she does not want certain aggressive measures either. She expresses while hospitalized she would like to approach each situation as they arise.   Patient reports that she would like to transition to the hospice house or have hospice come in to the facility for care. Support given, however patient has expressed wishes does not allow with her expressed goals for her care.  Educated patient and her family in great detail regarding the goals and philosophy of both hospice and palliative services.  Patient is aware that with hospice services she would discontinue any forms of aggressive medical interventions and all care will be focused on comfort.  Patient also expressed her goal is to return back to the facility and continue with her remaining rehabilitation days.  Patient educated with her intent to continue with rehabilitation this would also disqualify her for hospice services.  I educated patient and family all criteria for hospice home admission with a life expectancy of 2 weeks or less.  Explained that this time patient does not meet criteria for residential  hospice however if she chose to discontinue rehabilitation and aggressive measures she would meet criteria to have outpatient hospice services at her facility.  Patient and family are tearful as patient states "I am tired of coming back and forth to the hospital and I am tired of going through all the health trials continue to have."  Support and comfort given.  Discussed in detail what comfort care would look like.  Family aware that medical team will continue to provide care to patient however aggressive interventions such as continuous vital signs, ICU admissions, lab work, radiology testing, and some medications not focused on symptom management and comfort with no longer be given.  Care the patient received will be focused solely on the patient and how she is feeling on a comfort level.  Symptoms will be aggressively managed to control any signs of agitation, anxiety, shortness of breath, pain, nausea, and excessive secretions etc.  Patient and family verbalized understanding and appreciation.  Patient  and family verbalized at this time there wishes are to return back to [facility] to go home of continuing with her remaining rehabilitation days.  They also expressed continuation of outpatient palliative support.  Patient states once she has completed rehabilitation days she would like to transition to hospice services with a goal of end-of-life care.  Patient states she would not like to return back to the hospital and in the event her health declines and she develops respiratory distress again post discharge she would like to be kept comfortable at the facility until able to transport to the hospice home in [place].  States [facility] is close to her family and asked where she would prefer to be for end-of-life care.  Support given, however encourage patient to discuss her goal of continuing with rehabilitation days with in goal of transitioning to hospice.  Patient and daughter stated  they would like to continue with rehabilitation with hopes of allowing her a decent quality for the days that she has little.  They verbalized understanding of there are in goals of hospice care and comfort.  Patient and daughter confirms DNR/DNI.  I also discussed patient's recently completed MOLST form during her last admission.  I educated patient and family given her goals have changed at her request is not to be rehospitalized but to be kept comfortable she should consider completing a new MOLST with her new expressed wishes.  Patient and family verbalized understanding and expressed wishes to complete form at this time.  MOLST form completed at patient and family's request with patient's documented wishes of DNR, comfort care measures with no rehospitalization.  Patient expressed in her comments section wishes to transition to [facility] hospice home for end-of-life care once deemed appropriate by her outpatient palliative/hospice team.  Patient also indicated no IV fluids, no antibiotics, and no PEG tube placement on the most form.  MOST form completed by her daughter and signed by myself and patient's daughter at patient's request.  Again patient's expressed wishes is to discharge back to the facility with her current outpatient palliative team until she no longer is under rehabilitation days. Once she transitions from rehab to long-term care she would like to also transition to hospice care.   Questions and concerns were addressed. The family was encouraged to call with questions or concerns.  PMT will continue to support holistically.   Primary Decision Maker: Patient is alert and oriented x3.  She is currently capable of making her own medical decisions.  In the event patient is unable to make her medical decisions her daughter Ivan Croft is her documented [facility] POA. HCPOA    SUMMARY OF RECOMMENDATIONS    DNR/DNI-patient/daughter confirmed   MOST form completed and placed on  chart (DNR, Comfort Measures with no rehospitalizations, no IVF, no ABX, and No PEG.   Continue to treat the treatable while hospitalized without escalation of care.  Patient expressed wishes are to return to [facility] continue out her remaining rehabilitation days.  Patient counseled to continue to follow her during this time.  Patient is transition to long-term care at this facility she would also like to transition to hospice care.  Patient expressed her wishes are to transfer to the hospice home of [facility] for end-of-life care once deemed appropriate by her outpatient palliative/hospice team.  Continue with outpatient palliative.  Palliative Medicine team will continue to support patient, family, and medical team during hospitalization.  Code Status/Advance Care Planning:  DNR/DNI   Palliative  Prophylaxis:   Aspiration, Bowel Regimen, Frequent Pain Assessment and Turn Reposition  Additional Recommendations (Limitations, Scope, Preferences):  Full Scope Treatment-continue to treat the treatable without escalation of care  Psycho-social/Spiritual:   Desire for further Chaplaincy support: No  Additional Recommendations: Education on Hospice  Prognosis:   Unable to determine-guarded to poor in the setting of acute on chronic respiratory failure with hypoxia and hypercapnia, congestive heart failure, COPD, OSA, intermittent use of BiPAP, hypertension, depression, deconditioning, immobility, obesity hypoventilation syndrome, and hypothyroidism.  Discharge Planning: [place] for rehab with Palliative care service follow-up      Primary Diagnoses: Present on Admission: . Acute on chronic respiratory failure with hypoxia and hypercapnia (HCC)   I have reviewed the medical record, interviewed the patient and family, and examined the patient. The following aspects are pertinent.  Past Medical History:  Diagnosis Date  . Anemia   . CHF  (congestive heart failure) ([place])   . COPD (chronic obstructive pulmonary disease) ([place])   . Depression   . Hypertension   . Obesity hypoventilation syndrome ([place])   . Sleep apnea   . Thyroid disease    Social History   Socioeconomic History  . Marital status: Widowed    Spouse name: Not on file  . Number of children: Not on file  . Years of education: Not on file  . Highest education level: Not on file  Occupational History  . Not on file  Social Needs  . Financial resource strain: Patient refused  . Food insecurity:    Worry: Patient refused    Inability: Patient refused  . Transportation needs:    Medical: Patient refused    Non-medical: Patient refused  Tobacco Use  . Smoking status: Former Smoker    Years: 10.00  . Smokeless tobacco: Never Used  Substance and Sexual Activity  . Alcohol use: Not Currently  . Drug use: Not Currently  . Sexual activity: Not Currently  Lifestyle  . Physical activity:    Days per week: Patient refused    Minutes per session: Patient refused  . Stress: Patient refused  Relationships  . Social connections:    Talks on phone: Patient refused    Gets together: Patient refused    Attends religious service: Patient refused    Active member of club or organization: Patient refused    Attends meetings of clubs or organizations: Patient refused    Relationship status: Patient refused  Other Topics Concern  . Not on file  Social History Narrative   From nursing facility   Family History  Problem Relation Age of Onset  . Hypertension Mother    Scheduled Meds: . budesonide  0.5 mg Nebulization BID  . carvedilol  3.125 mg Oral BID WC  . citalopram  40 mg Oral Daily  . docusate sodium  100 mg Oral BID  . enoxaparin (LOVENOX) injection  40 mg Subcutaneous Q12H  . ferrous sulfate  325 mg Oral Q breakfast  . furosemide  40 mg Oral BID  . ipratropium-albuterol  3 mL Nebulization Q4H  . levothyroxine  100 mcg Oral QAC breakfast  . mouth  rinse  15 mL Mouth Rinse BID   Continuous Infusions: PRN Meds:.acetaminophen **OR** acetaminophen, ondansetron **OR** ondansetron (ZOFRAN) IV, traZODone Medications Prior to Admission:  Prior to Admission medications   Medication Sig Start Date End Date Taking? Authorizing Provider  budesonide (PULMICORT) 0.25 MG/2ML nebulizer solution Take 2 mLs (0.25 mg total) by nebulization 2 (two) times daily. 01/01/18  Yes Vaughan Basta, MD  carvedilol (COREG) 3.125 MG tablet Take 3.125 mg by mouth 2 (two) times daily with a meal.   Yes [provider]  citalopram (CELEXA) 40 MG tablet Take 40 mg by mouth daily.    Yes [provider]  ferrous sulfate 325 (65 FE) MG tablet Take 325 mg by mouth daily with breakfast.   Yes [provider]  furosemide (LASIX) 40 MG tablet Take 1 tablet (40 mg total) by mouth 2 (two) times daily. Patient taking differently: Take 80 mg by mouth 3 (three) times daily.  11/25/17  Yes Demetrios Loll, MD  ipratropium-albuterol (DUONEB) 0.5-2.5 (3) MG/3ML SOLN Take 3 mLs by nebulization every 6 (six) hours as needed (shortness of breath). Patient taking differently: Take 3 mLs by nebulization every 6 (six) hours as needed (shortness of breath/ wheezing).  11/05/17  Yes Gladstone Lighter, MD  levothyroxine (SYNTHROID, LEVOTHROID) 100 MCG tablet Take 1 tablet (100 mcg total) by mouth daily before breakfast. 11/06/17  Yes Gladstone Lighter, MD  Turmeric 450 MG CAPS Take 450 mg by mouth daily.   Yes [provider]   Allergies  Allergen Reactions  . Levaquin [Levofloxacin In D5w] Itching and Rash   Review of Systems  Constitutional: Positive for activity change and fatigue.  Respiratory: Positive for cough, shortness of breath and wheezing.   Musculoskeletal: Positive for arthralgias.  Neurological: Positive for weakness.  All other systems reviewed and are negative.   Physical Exam  Constitutional: She is oriented to person, place, and  time. Vital signs are normal. She is cooperative. She appears ill.  Obese, chronically ill appearing   Cardiovascular: Normal rate, regular rhythm, normal heart sounds and normal pulses.  Pulmonary/Chest: Effort normal. She has decreased breath sounds.  5L/[place] with intermittent BiPAP use at bedtime   Neurological: She is alert and oriented to person, place, and time.  Skin: Skin is warm, dry and intact. Bruising noted.  Psychiatric: She has a normal mood and affect. Her speech is normal and behavior is normal. Judgment and thought content normal. Cognition and memory are normal.  Nursing note and vitals reviewed.   Vital Signs: BP 95/60   Pulse 77   Temp 98 F (36.7 C) (Oral)   Resp 20   Ht _0  (1.676 m)   Wt (!) 231 kg   SpO2 92%   BMI 82.20 kg/m  Pain Scale: 0-10   Pain Score: 0-No pain   SpO2: SpO2: 92 % O2 Device:SpO2: 92 % O2 Flow Rate: .O2 Flow Rate (L/min): 5 L/min  IO: Intake/output summary:   Intake/Output Summary (Last 24 hours) at 02/21/2018 1439 Last data filed at 02/21/2018 0844 Gross per 24 hour  Intake 240 ml  Output 2850 ml  Net -2610 ml    LBM: Last BM Date: 02/19/18 Baseline Weight: Weight: (!) 201 kg Most recent weight: Weight: (!) 231 kg     Palliative Assessment/Data:PPS 30 %   Time In: 1000 Time Out: 1130 Time Total: 90 min.   Greater than 50%  of this time was spent counseling and coordinating care related to the above assessment and plan.  Signed by:  Alda Lea, AGPCNP-BC Palliative Medicine Team  Phone: [phone] Fax: [phone] Pager: [phone] Amion: Bjorn Pippin    Please contact Palliative Medicine Team phone at [phone] for questions and concerns.  For individual provider: See Shea Evans

## 2018-02-21 NOTE — Clinical Social Work Note (Signed)
CSW received consult that patient is from [facility], CSW spoke to [facility], and they can accept patient back once she is medically ready for discharge and orders have been received.  Ervin KnackEric R. Hassan Rowannterhaus, MSW, Theresia MajorsLCSWA [phone]  02/21/2018 5:38 PM

## 2018-02-21 NOTE — Plan of Care (Signed)

## 2018-02-21 NOTE — Progress Notes (Addendum)
[employer] - [place] [facility]   PATIENT NAME: Shelbie Hutchingammy Pha    MR#:  [id number]  DATE OF BIRTH:  Oct 30, 1960  SUBJECTIVE:  CHIEF COMPLAINT:   Chief Complaint  Patient presents with  . Respiratory Distress   The patient has no complaints, on oxygen by nasal cannula on 4 L.   REVIEW OF SYSTEMS:  Review of Systems  Constitutional: Positive for malaise/fatigue. Negative for chills and fever.  HENT: Negative for sore throat.   Eyes: Negative for blurred vision and double vision.  Respiratory: Negative for cough, hemoptysis, shortness of breath, wheezing and stridor.   Cardiovascular: Negative for chest pain, palpitations, orthopnea and leg swelling.  Gastrointestinal: Negative for abdominal pain, blood in stool, diarrhea, melena, nausea and vomiting.  Genitourinary: Negative for dysuria, flank pain and hematuria.  Musculoskeletal: Negative for back pain and joint pain.  Skin: Negative for rash.  Neurological: Negative for dizziness, sensory change, focal weakness, seizures, loss of consciousness, weakness and headaches.  Endo/Heme/Allergies: Negative for polydipsia.  Psychiatric/Behavioral: Negative for depression. The patient is not nervous/anxious.     DRUG ALLERGIES:   Allergies  Allergen Reactions  . Levaquin [Levofloxacin In D5w] Itching and Rash   VITALS:  Blood pressure 101/60, pulse 84, temperature 98.4 F (36.9 C), temperature source Oral, resp. rate 19, height 5\' 6"  (1.676 m), weight (!) 226 kg, SpO2 92 %. PHYSICAL EXAMINATION:  Physical Exam  Constitutional: She is oriented to person, place, and time. No distress.  Morbid obesity.  HENT:  Head: Normocephalic.  Mouth/Throat: Oropharynx is clear and moist.  Eyes: Pupils are equal, round, and reactive to light. Conjunctivae and EOM are normal. No scleral icterus.  Neck: Normal range of motion. Neck supple. No JVD present. No tracheal deviation present.  Cardiovascular: Normal rate, regular  rhythm and normal heart sounds. Exam reveals no gallop.  No murmur heard. Pulmonary/Chest: Effort normal. No respiratory distress. She has no wheezes. She has no rales.  Diminished breath sounds  Abdominal: Soft. Bowel sounds are normal. She exhibits no distension. There is no tenderness. There is no rebound.  Musculoskeletal: Normal range of motion. She exhibits no edema or tenderness.  Neurological: She is alert and oriented to person, place, and time. No cranial nerve deficit.  Skin: No rash noted. No erythema.  Psychiatric: She has a normal mood and affect.   LABORATORY PANEL:  Female CBC Recent Labs  Lab 02/19/18 0401  WBC 6.5  HGB 13.4  HCT 46.0  PLT 169   ------------------------------------------------------------------------------------------------------------------ Chemistries  Recent Labs  Lab 02/19/18 0401  02/21/18 0511  NA 144   < > 147*  K 5.1   < > 4.3  CL 81*   < > 78*  CO2 45*   < > >50*  GLUCOSE 122*   < > 110*  BUN 20   < > 22*  CREATININE 0.49   < > 0.47  CALCIUM 8.6*   < > 8.9  MG  --    < > 1.9  AST 40  --   --   ALT 28  --   --   ALKPHOS 71  --   --   BILITOT 1.9*  --   --    < > = values in this interval not displayed.   RADIOLOGY:  Dg Chest Port 1 View  Result Date: 02/21/2018 CLINICAL DATA:  Atelectasis. EXAM: PORTABLE CHEST 1 VIEW COMPARISON:  Radiograph February 19, 2018. FINDINGS: Stable cardiomegaly with central pulmonary vascular congestion  is noted. No pneumothorax or pleural effusion is noted. No consolidative process is noted. Chronic dislocation of right shoulder is noted. IMPRESSION: Stable cardiomegaly with central pulmonary vascular congestion. Electronically Signed   By: Lupita Raider, M.D.   On: 02/21/2018 07:31   ASSESSMENT AND PLAN:   This is a 57 year old female admitted for respiratory failure.  1.    Acute on chronic respiratory failure with hypoxia and hypercapnia. Off BiPAP. On O2 [place] 4 L.  She is on BiPAP as needed in  skilled nursing facility. Continue inhaled corticosteroid.  Schedule albuterol.  She was given ceftriaxone and azithromycin.  2.  COPD exacerbation: The patient's PCO2 is dramatically elevated and surprisingly she is still conscious. Treatment as above.  3.  Hypertension: Controlled; continue carvedilol, hold if blood pressure is low.  4.  CHF: Acute on chronic; diastolic.  Continue Lasix per home regimen.  Hypertension.  Controlled.  Morbid obesity and OSA, hypoventilation syndrome. BiPAP PRN.  Poor prognosis.  The patient will be discharged to [facility] with hospice care once she is relatively stable.  All the records are reviewed and case discussed with Care Management/Social Worker. Management plans discussed with the patient, her daughter and they are in agreement.  CODE STATUS: DNR  TOTAL TIME TAKING CARE OF THIS PATIENT: 26 minutes.   More than 50% of the time was spent in counseling/coordination of care: YES  POSSIBLE D/C IN 2 DAYS, DEPENDING ON CLINICAL CONDITION.   Shaune Pollack M.D on 02/21/2018 at 5:30 PM  Between 7am to 6pm - Pager - [phone]  After 6pm go to [email or web address] - Therapist, nutritional Hospitalists

## 2018-02-22 LAB — BASIC METABOLIC PANEL
BUN: 19 mg/dL (ref 6–20)
CHLORIDE: 79 mmol/L — AB (ref 98–111)
CO2: >50 mmol/L — ABNORMAL HIGH (ref 22–32)
CREATININE: 0.47 mg/dL (ref 0.44–1.00)
Calcium: 8.9 mg/dL (ref 8.9–10.3)
GFR calc Af Amer: >60 mL/min (ref >60)
Glucose, Bld: 99 mg/dL (ref 70–99)
POTASSIUM: 3.7 mmol/L (ref 3.5–5.1)
Sodium: 146 mmol/L — ABNORMAL HIGH (ref 135–145)

## 2018-02-22 LAB — BLOOD GAS, VENOUS
PATIENT TEMPERATURE: 37
PH VEN: 7.37 (ref 7.250–7.430)
pCO2, Ven: 120 mmHg (ref 44.0–60.0)
pO2, Ven: 56 mmHg — ABNORMAL HIGH (ref 32.0–45.0)

## 2018-02-22 LAB — CALCIUM, IONIZED: CALCIUM, IONIZED, SERUM: 4.6 mg/dL (ref 4.5–5.6)

## 2018-02-22 MED ORDER — IPRATROPIUM-ALBUTEROL 0.5-2.5 (3) MG/3ML IN SOLN
3.0000 mL | Freq: Four times a day (QID) | RESPIRATORY_TRACT | Status: DC
  Administered 2018-02-22: 3 mL via RESPIRATORY_TRACT
  Filled 2018-02-22: qty 3

## 2018-02-22 NOTE — [place] FL2 (Signed)
[place] MEDICAID FL2 LEVEL OF CARE SCREENING TOOL     IDENTIFICATION  Patient Name: Rebecca Bird Birthdate: 1960-11-29 Sex: female Admission Date (Current Location): 02/19/2018  [place]  [place] [id number] T Facility and Address:  [facility], [address], [place], [place] [address]      Provider Number: [id number]  Attending Physician Name and Address:  Enedina FinnerPatel, Sona, MD  Relative Name and Phone Number:  Dorothey BasemanDanielle Pennington- daughter [phone]    Current Level of Care: Hospital Recommended Level of Care: Skilled Nursing Facility Prior Approval Number:    Date Approved/Denied:   PASRR Number: [id number]  Discharge Plan: SNF    Current Diagnoses: Patient Active Problem List   Diagnosis Date Noted  . Acute on chronic respiratory failure with hypoxia and hypercapnia (HCC) 02/19/2018  . Acute respiratory failure with hypoxia (HCC) 02/05/2018  . COPD with acute exacerbation (HCC) 12/27/2017  . HTN (hypertension) 12/27/2017  . Hypothyroidism 12/27/2017  . OSA (obstructive sleep apnea)   . Obesity hypoventilation syndrome (HCC)   . Palliative care by specialist   . Goals of care, counseling/discussion   . Acute on chronic respiratory failure with hypoxemia (HCC) 11/01/2017    Orientation RESPIRATION BLADDER Height & Weight     Self, Time, Situation, Place  O2(3L) Incontinent Weight: (bed weight inaccurate, pt refused to be weighed on lift) Height:  5\' 6"  (167.6 cm)  BEHAVIORAL SYMPTOMS/MOOD NEUROLOGICAL BOWEL NUTRITION STATUS      Continent Diet(Cardiac)  AMBULATORY STATUS COMMUNICATION OF NEEDS Skin   Extensive Assist Verbally Normal                       Personal Care Assistance Level of Assistance  Bathing, Feeding, Dressing Bathing Assistance: Limited assistance Feeding assistance: Independent Dressing Assistance: Limited assistance     Functional Limitations Info  Sight, Hearing, Speech Sight  Info: Adequate Hearing Info: Adequate Speech Info: Adequate    SPECIAL CARE FACTORS FREQUENCY  PT (By licensed PT)     PT Frequency: 5x a week              Contractures Contractures Info: Not present    Additional Factors Info  Code Status, Allergies, Psychotropic Code Status Info: DNR Allergies Info: LEVAQUIN LEVOFLOXACIN IN D5W  Psychotropic Info: citalopram (CELEXA) tablet 40 mg          Current Medications (02/22/2018):  This is the current hospital active medication list Current Facility-Administered Medications  Medication Dose Route Frequency Provider Last Rate Last Dose  . acetaminophen (TYLENOL) tablet 650 mg  650 mg Oral Q6H PRN Arnaldo Nataliamond, Michael S, MD       Or  . acetaminophen (TYLENOL) suppository 650 mg  650 mg Rectal Q6H PRN Arnaldo Nataliamond, Michael S, MD      . budesonide (PULMICORT) nebulizer solution 0.5 mg  0.5 mg Nebulization BID Uvaldo RisingSamaan, Maged, MD   0.5 mg at 02/22/18 0942  . carvedilol (COREG) tablet 3.125 mg  3.125 mg Oral BID WC Arnaldo Nataliamond, Michael S, MD   3.125 mg at 02/22/18 1011  . citalopram (CELEXA) tablet 40 mg  40 mg Oral Daily Arnaldo Nataliamond, Michael S, MD   40 mg at 02/22/18 1011  . docusate sodium (COLACE) capsule 100 mg  100 mg Oral BID Arnaldo Nataliamond, Michael S, MD   100 mg at 02/22/18 1011  . enoxaparin (LOVENOX) injection 40 mg  40 mg Subcutaneous Q12H Shaune Pollackhen, Qing, MD   40 mg at 02/22/18 1012  . ferrous sulfate  tablet 325 mg  325 mg Oral Q breakfast Arnaldo Natal, MD   325 mg at 02/22/18 1025  . furosemide (LASIX) tablet 40 mg  40 mg Oral BID Arnaldo Natal, MD   40 mg at 02/22/18 1011  . ipratropium-albuterol (DUONEB) 0.5-2.5 (3) MG/3ML nebulizer solution 3 mL  3 mL Nebulization Q6H Enedina Finner, MD      . levothyroxine (SYNTHROID, LEVOTHROID) tablet 100 mcg  100 mcg Oral QAC breakfast Arnaldo Natal, MD   100 mcg at 02/22/18 0726  . MEDLINE mouth rinse  15 mL Mouth Rinse BID Erin Fulling, MD   15 mL at 02/22/18 0025  . ondansetron (ZOFRAN) tablet 4 mg   4 mg Oral Q6H PRN Arnaldo Natal, MD       Or  . ondansetron [facility]) injection 4 mg  4 mg Intravenous Q6H PRN Arnaldo Natal, MD      . traZODone (DESYREL) tablet 50 mg  50 mg Oral QHS PRN Eugenie Norrie, NP   50 mg at 02/21/18 0231     Discharge Medications: Please see discharge summary for a list of discharge medications.  Relevant Imaging Results:  Relevant Lab Results:   Additional Information ZO#[id number]  Darleene Cleaver, LCSWA

## 2018-02-22 NOTE — Progress Notes (Signed)
Patient tidal volumes around per breath, patient stated she felt uncomfortable, IPAP setting changed to 14, patient pulling more appropriate tidal volumes now and seems more comfortable at 14/8 Bipap Settings. Will continue to monitor.

## 2018-02-22 NOTE — Clinical Social Work Note (Signed)
Patient to be d/c'ed today to [facility] room 11B.   Patient and family agreeable to plans will transport via ems RN to call report [phone].  Windell MouldingEric Alaycia Eardley, MSW, Theresia MajorsLCSWA [phone]

## 2018-02-22 NOTE — Care Management Important Message (Signed)
Copy of signed IM left with patient in room.  

## 2018-02-22 NOTE — Progress Notes (Signed)
Pt to transfer to [facility],  Report called to [place] @ [phone].  AKingBSNRN

## 2018-02-22 NOTE — Clinical Social Work Note (Signed)
Clinical Social Work Assessment  Patient Details  Name: Rebecca Bird MRN: [id number] Date of Birth: 10-06-1960  Date of referral:  02/22/18               Reason for consult:  Facility Placement                Permission sought to share information with:  Facility Medical sales representativeContact Representative, Family Supports Permission granted to share information::  Yes, Verbal Permission Granted  Name::     Dorothey Basemanennington, Danielle Daughter   [phone] or Rosann AuerbachRoberts, Judy Aunt   [phone] or Berkley HarveyLookabill, Wanda Relative   [phone] or Milana KidneyYounts,Terry Son   [phone]   Agency::  SNF admissions  Relationship::     Contact Information:     Housing/Transportation Living arrangements for the past 2 months:  Skilled Nursing Facility Source of Information:  Patient, Medical Team Patient Interpreter Needed:  None Criminal Activity/Legal Involvement Pertinent to Current Situation/Hospitalization:  No - Comment as needed Significant Relationships:  Adult Children Lives with:  Facility Resident Do you feel safe going back to the place where you live?  Yes Need for family participation in patient care:  Yes (Comment)  Care giving concerns:  Patient did not express any concerns with returning back to [facility].   Social Worker assessment / plan:  Patient is a long term care resident at [facility].  Patient states she is ready to return back to SNF.  CSW also spoke to patient's daughter Duwayne HeckDanielle who confirmed they are satisfied at [facility].  Patient did not express any other concerns neither did patient's daughter.  CSW to send updated clinicals to [facility].   Employment status:  Retired Health and safety inspectornsurance information:  Medicare PT Recommendations:  Skilled Nursing Facility Information / Referral to community resources:  Skilled Nursing Facility  Patient/Family's Response to care: Patient and family agreeable to going back to [facility].  Patient and her family would  like to have palliative to continue to follow her, if she continues to decline, they are going to consider [place].  Patient/Family's Understanding of and Emotional Response to Diagnosis, Current Treatment, and Prognosis:  Patient is aware of her current treatment plan and prognosis.  Emotional Assessment Appearance:  Appears stated age Attitude/Demeanor/Rapport:    Affect (typically observed):  Accepting, Pleasant Orientation:  Oriented to Self, Oriented to Place, Oriented to  Time, Oriented to Situation Alcohol / Substance use:  Not Applicable Psych involvement (Current and /or in the community):  No (Comment)  Discharge Needs  Concerns to be addressed:  Care Coordination Readmission within the last 30 days:  Yes Current discharge risk:  None Barriers to Discharge:  No Barriers Identified   Darleene Cleavernterhaus, Aanika Defoor R, LCSWA 02/22/2018, 2:25 PM

## 2018-02-22 NOTE — Discharge Summary (Signed)
[place] [facility] [place] NAME: Rebecca Bird    MR#:  [id number]  DATE OF BIRTH:  29-Dec-1960  DATE OF ADMISSION:  02/19/2018 ADMITTING PHYSICIAN: Harrie Foreman, MD  DATE OF DISCHARGE: 02/22/2018  PRIMARY CARE PHYSICIAN: Donato Schultz, MD    ADMISSION DIAGNOSIS:  COPD exacerbation ([place]) [J44.1] Acute respiratory failure with hypoxia and hypercarbia ([place]) [J96.01, J96.02] Community acquired pneumonia, unspecified laterality [J18.9]  DISCHARGE DIAGNOSIS:  Acute on Chronic hypoxic/Hypercarbic respiratory failure Chronic Hypercarbia Morbid obesity  SECONDARY DIAGNOSIS:   Past Medical History:  Diagnosis Date  . Anemia   . CHF (congestive heart failure) ([place])   . COPD (chronic obstructive pulmonary disease) ([place])   . Depression   . Hypertension   . Obesity hypoventilation syndrome ([place])   . Sleep apnea   . Thyroid disease     HOSPITAL COURSE:   57 year old female admitted for respiratory failure.  1.   Acute on chronic respiratory failure with hypoxia and hypercapnia. Off BiPAP. On O2 [place] 4 L.  She is on BiPAP as needed in skilled nursing facility. Continue inhaled corticosteroid. Schedule albuterol.   2. Acute on [place] COPD exacerbation: The patient's PCO2 is chronically elevated with chornically low chloride suspected due to chronic respiraotry failure  Treatment as above.  3. Hypertension: Controlled; continue carvedilol, hold if blood pressure is low.  4. CHF: Acute on chronic; diastolic.  Continue Lasix per home regimen.  5.Hypertension.  Controlled.  6.Morbid obesity and OSA, hypoventilation syndrome. BiPAP PRN.  7. Right shoulder dislocation -in the past -sling  -prn pain meds  Pt and dter Rebecca Bird met with palliative care. She will return to [facility] with Palliative care to follow. If pt happens to decline, then she does not want to return to the hospital and would like to get transferred to  hospice home. This was re-confirmed with dter Rebecca Bird over the phone today.  Overall pt caries a Poor prognosis.  The patient will be discharged to [facility] with palliative care to follow. CONSULTS OBTAINED:  Treatment Team:  Demetrios Loll, MD  DRUG ALLERGIES:   Allergies  Allergen Reactions  . Levaquin [Levofloxacin In D5w] Itching and Rash    DISCHARGE MEDICATIONS:   Allergies as of 02/22/2018      Reactions   Levaquin [levofloxacin In D5w] Itching, Rash      Medication List    TAKE these medications   budesonide 0.25 MG/2ML nebulizer solution Commonly known as:  PULMICORT Take 2 mLs (0.25 mg total) by nebulization 2 (two) times daily.   carvedilol 3.125 MG tablet Commonly known as:  COREG Take 3.125 mg by mouth 2 (two) times daily with a meal.   citalopram 40 MG tablet Commonly known as:  CELEXA Take 40 mg by mouth daily.   ferrous sulfate 325 (65 FE) MG tablet Take 325 mg by mouth daily with breakfast.   furosemide 40 MG tablet Commonly known as:  LASIX Take 1 tablet (40 mg total) by mouth 2 (two) times daily. What changed:    how much to take  when to take this   ipratropium-albuterol 0.5-2.5 (3) MG/3ML Soln Commonly known as:  DUONEB Take 3 mLs by nebulization every 6 (six) hours as needed (shortness of breath). What changed:  reasons to take this   levothyroxine 100 MCG tablet Commonly known as:  SYNTHROID, LEVOTHROID Take 1 tablet (100 mcg total) by mouth daily before breakfast.   Turmeric 450 MG Caps Take 450  mg by mouth daily.       If you experience worsening of your admission symptoms, develop shortness of breath, life threatening emergency, suicidal or homicidal thoughts you must seek medical attention immediately by calling 911 or calling your MD immediately  if symptoms less severe.  You Must read complete instructions/literature along with all the possible adverse reactions/side effects for all the Medicines you take and  that have been prescribed to you. Take any new Medicines after you have completely understood and accept all the possible adverse reactions/side effects.   Please note  You were cared for by a hospitalist during your hospital stay. If you have any questions about your discharge medications or the care you received while you were in the hospital after you are discharged, you can call the unit and asked to speak with the hospitalist on call if the hospitalist that took care of you is not available. Once you are discharged, your primary care physician will handle any further medical issues. Please note that NO REFILLS for any discharge medications will be authorized once you are discharged, as it is imperative that you return to your primary care physician (or establish a relationship with a primary care physician if you do not have one) for your aftercare needs so that they can reassess your need for medications and monitor your lab values. Today   SUBJECTIVE  I feel at baseline. My right shoulder is hurting   VITAL SIGNS:  Blood pressure (!) 114/58, pulse 88, temperature 97.8 F (36.6 C), temperature source Oral, resp. rate 20, height 5' 6"  (1.676 m), weight (!) 226 kg, SpO2 90 %.  I/O:    Intake/Output Summary (Last 24 hours) at 02/22/2018 1034 Last data filed at 02/22/2018 1018 Gross per 24 hour  Intake 600 ml  Output 1300 ml  Net -700 ml    PHYSICAL EXAMINATION:  GENERAL:  57 y.o.-year-old patient lying in the bed with no acute distress. Morbid obeisty EYES: Pupils equal, round, reactive to light and accommodation. No scleral icterus. Extraocular muscles intact.  HEENT: Head atraumatic, normocephalic. Oropharynx and nasopharynx clear. BIPAP+ NECK:  Supple, no jugular venous distention. No thyroid enlargement, no tenderness.  LUNGS: Normal breath sounds bilaterally, no wheezing, rales,rhonchi or crepitation. No use of accessory muscles of respiration.  CARDIOVASCULAR: S1, S2 normal.  No murmurs, rubs, or gallops.  ABDOMEN: Soft, non-tender, non-distended. Bowel sounds present. No organomegaly or mass.  EXTREMITIES: chronic pedal edema, no cyanosis, or clubbing.  NEUROLOGIC: Cranial nerves II through XII are intact. Grossly intact PSYCHIATRIC: The patient is alert and oriented x 3.  SKIN: No obvious rash, lesion, or ulcer. Dry skin  DATA REVIEW:   CBC  Recent Labs  Lab 02/19/18 0401  WBC 6.5  HGB 13.4  HCT 46.0  PLT 169    Chemistries  Recent Labs  Lab 02/19/18 0401  02/21/18 0511 02/22/18 0253  NA 144   < > 147* 146*  K 5.1   < > 4.3 3.7  CL 81*   < > 78* 79*  CO2 45*   < > >50* >50*  GLUCOSE 122*   < > 110* 99  BUN 20   < > 22* 19  CREATININE 0.49   < > 0.47 0.47  CALCIUM 8.6*   < > 8.9 8.9  MG  --    < > 1.9  --   AST 40  --   --   --   ALT 28  --   --   --  ALKPHOS 71  --   --   --   BILITOT 1.9*  --   --   --    < > = values in this interval not displayed.    Microbiology Results   No results found for this or any previous visit (from the past 240 hour(s)).  RADIOLOGY:  Dg Chest Port 1 View  Result Date: 02/21/2018 CLINICAL DATA:  Atelectasis. EXAM: PORTABLE CHEST 1 VIEW COMPARISON:  Radiograph February 19, 2018. FINDINGS: Stable cardiomegaly with central pulmonary vascular congestion is noted. No pneumothorax or pleural effusion is noted. No consolidative process is noted. Chronic dislocation of right shoulder is noted. IMPRESSION: Stable cardiomegaly with central pulmonary vascular congestion. Electronically Signed   By: Marijo Conception, M.D.   On: 02/21/2018 07:31     Management plans discussed with the patient, family and they are in agreement.  CODE STATUS:     Code Status Orders  (From admission, onward)         Start     Ordered   02/19/18 0940  Do not attempt resuscitation (DNR)  Continuous    Question Answer Comment  In the event of cardiac or respiratory ARREST Do not call a "code blue"   In the event of cardiac or  respiratory ARREST Do not perform Intubation, CPR, defibrillation or ACLS   In the event of cardiac or respiratory ARREST Use medication by any route, position, wound care, and other measures to relive pain and suffering. May use oxygen, suction and manual treatment of airway obstruction as needed for comfort.   Comments Patient wishes for one cycle of CPR/ACLS. If she is not revived, do not attempt further CPR/ACLS.      02/19/18 0939        Code Status History    Date Active Date Inactive Code Status Order ID Comments User Context   02/05/2018 2304 02/06/2018 1810 DNR [id number]  Lance Coon, MD Inpatient   01/20/2018 0455 01/22/2018 1915 DNR [id number]  Harrie Foreman, MD Inpatient   12/28/2017 0125 01/01/2018 2044 DNR [id number]  Lance Coon, MD ED   11/21/2017 1408 11/25/2017 2055 Partial Code [id number]  Basilio Cairo, NP Inpatient   11/16/2017 0328 11/21/2017 1408 Full Code [id number]  Harrie Foreman, MD Inpatient   11/01/2017 0850 11/05/2017 1903 Full Code [id number]  Harrie Foreman, MD Inpatient    Advance Directive Documentation     Most Recent Value  Type of Advance Directive  Healthcare Power of Attorney, Out of facility DNR (pink MOST or yellow form)  Pre-existing out of facility DNR order (yellow form or pink MOST form)  -  "MOST" Form in Place?  -      TOTAL TIME TAKING CARE OF THIS PATIENT:40 minutes.    Fritzi Mandes M.D on 02/22/2018 at 10:34 AM  Between 7am to 6pm - Pager - [phone] After 6pm go to [email or web address] - password [email or web address] [place] [facility]  Office  [phone]  CC: Primary care physician; Donato Schultz, MD

## 2018-02-22 NOTE — Care Management Note (Signed)
Case Management Note  Patient Details  Name: Rebecca Bird MRN: [id number] Date of Birth: 1960/08/08  Subjective/Objective:    Patient discharging today to [facility].  Clydie BraunKaren with Outpatient Palliative Aware.                   Action/Plan:   Expected Discharge Date:  02/22/18               Expected Discharge Plan:  Rest Home  In-House Referral:  Clinical Social Work  Discharge planning Services  CM Consult  Post Acute Care Choice:    Choice offered to:     DME Arranged:    DME Agency:     HH Arranged:    HH Agency:     Status of Service:  Completed, signed off  If discussed at [employer] of [employer] Meetings, dates discussed:    Additional Comments:  Sherren KernsJennifer L Aragorn Recker, RN 02/22/2018, 11:52 AM

## 2018-02-22 NOTE — Discharge Instructions (Signed)
Use you CPAP, oxygen  nd nebulizer as before

## 2018-02-27 ENCOUNTER — Ambulatory Visit: Payer: Medicare Other

## 2018-02-28 ENCOUNTER — Ambulatory Visit: Payer: Medicare Other

## 2018-07-05 DEATH — deceased

## 2019-05-16 IMAGING — DX DG CHEST 1V PORT
1 series · 1 of 1 positions shown · non-contrast
Comparison: 11/15/2017.

CLINICAL DATA: Hypoxia.

EXAM:
PORTABLE CHEST 1 VIEW

[chest ap]
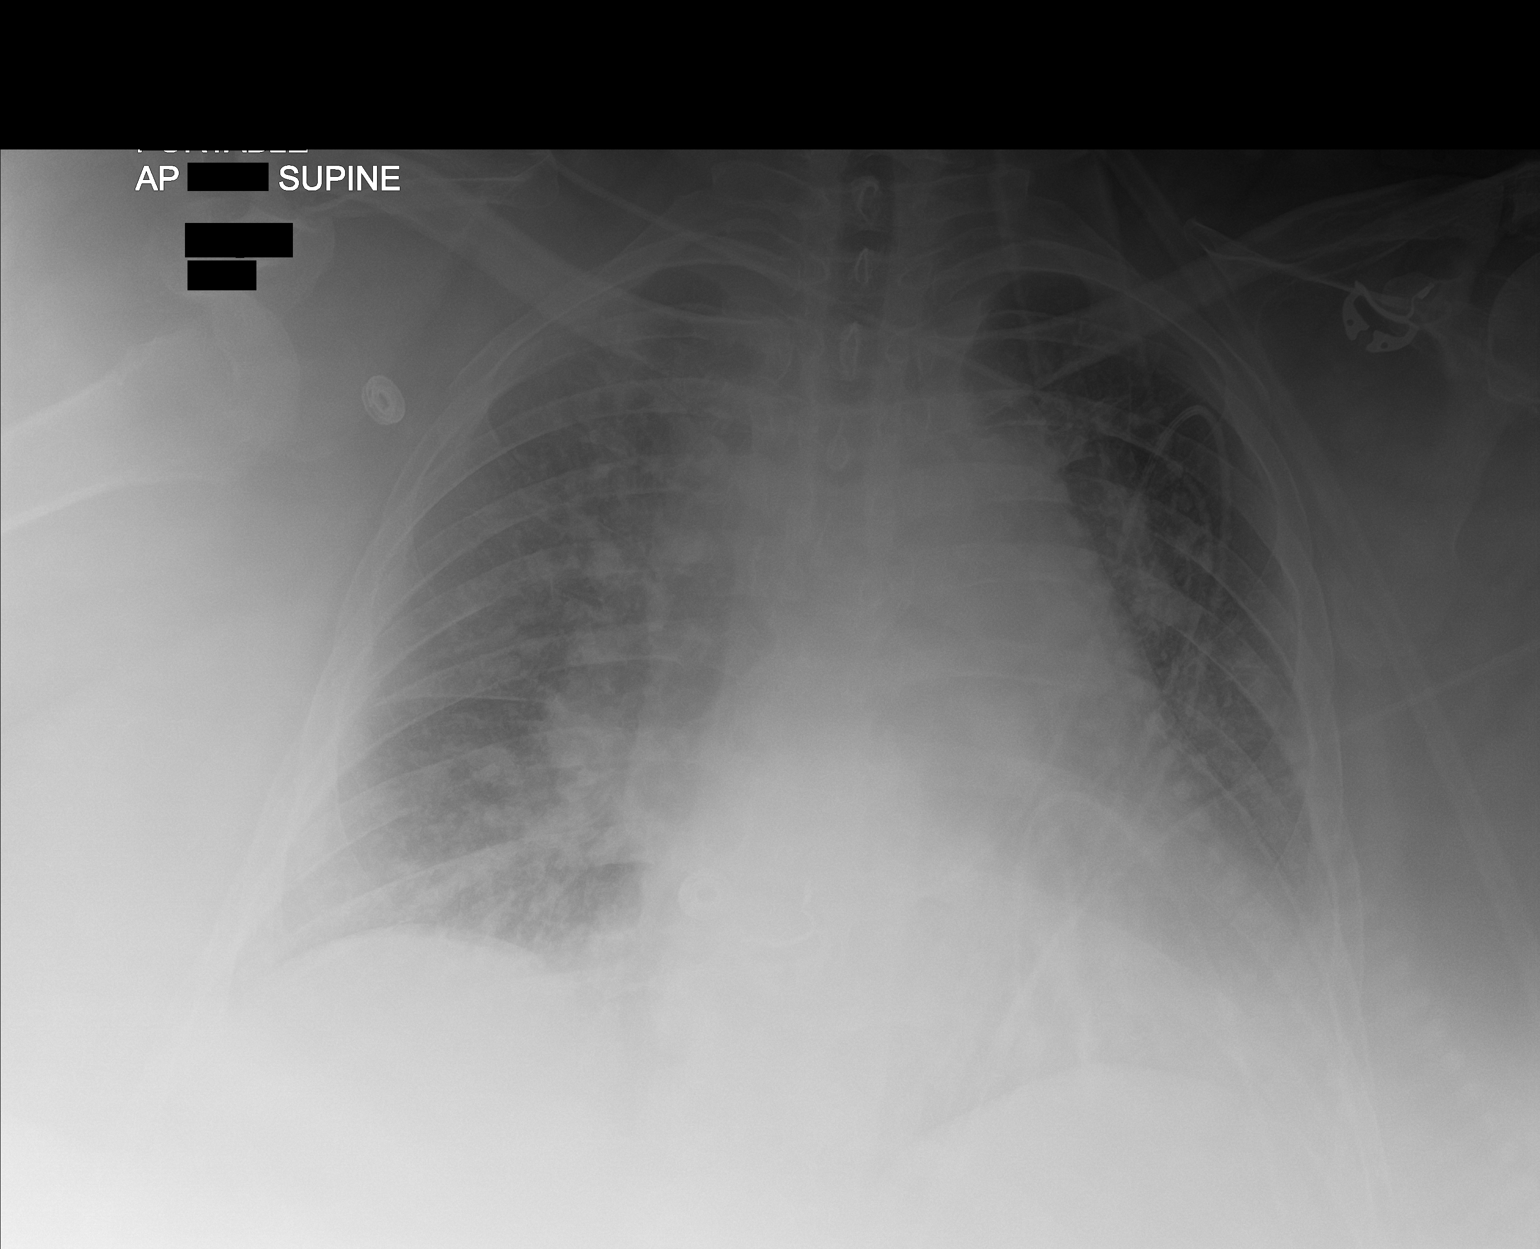

[1 of 1 positions shown; findings below may reference images not displayed]

FINDINGS: Cardiomegaly again noted. Diffuse interstitial prominence again
noted. Interstitial prominence has increased slightly from prior
exam. Findings suggest CHF. Similar findings noted on prior exam.
Small bilateral pleural effusions cannot be excluded. No
pneumothorax.
IMPRESSION: Cardiomegaly with mild pulmonary interstitial prominence again
noted. Interstitial prominence has increased slightly from prior
exam. Findings suggest CHF. Similar findings noted on prior exam.
Small bilateral pleural effusions cannot be excluded.

## 2019-08-03 IMAGING — DX DG CHEST 1V PORT
1 series · 1 of 1 positions shown · non-contrast
Comparison: Radiograph 01/21/2018

CLINICAL DATA: Pt arrived from [REDACTED] with resp
distress with O2 sats in the 60's at the faciliity. Pt is normally
on 3L. Pt arrived here with sats in the low 80's. Pt has hx of right
shoulder dislocation. Pt NAD but is obese. Hx -...*comment was
truncated*resp distress

EXAM:
PORTABLE CHEST 1 VIEW

[chest ap]
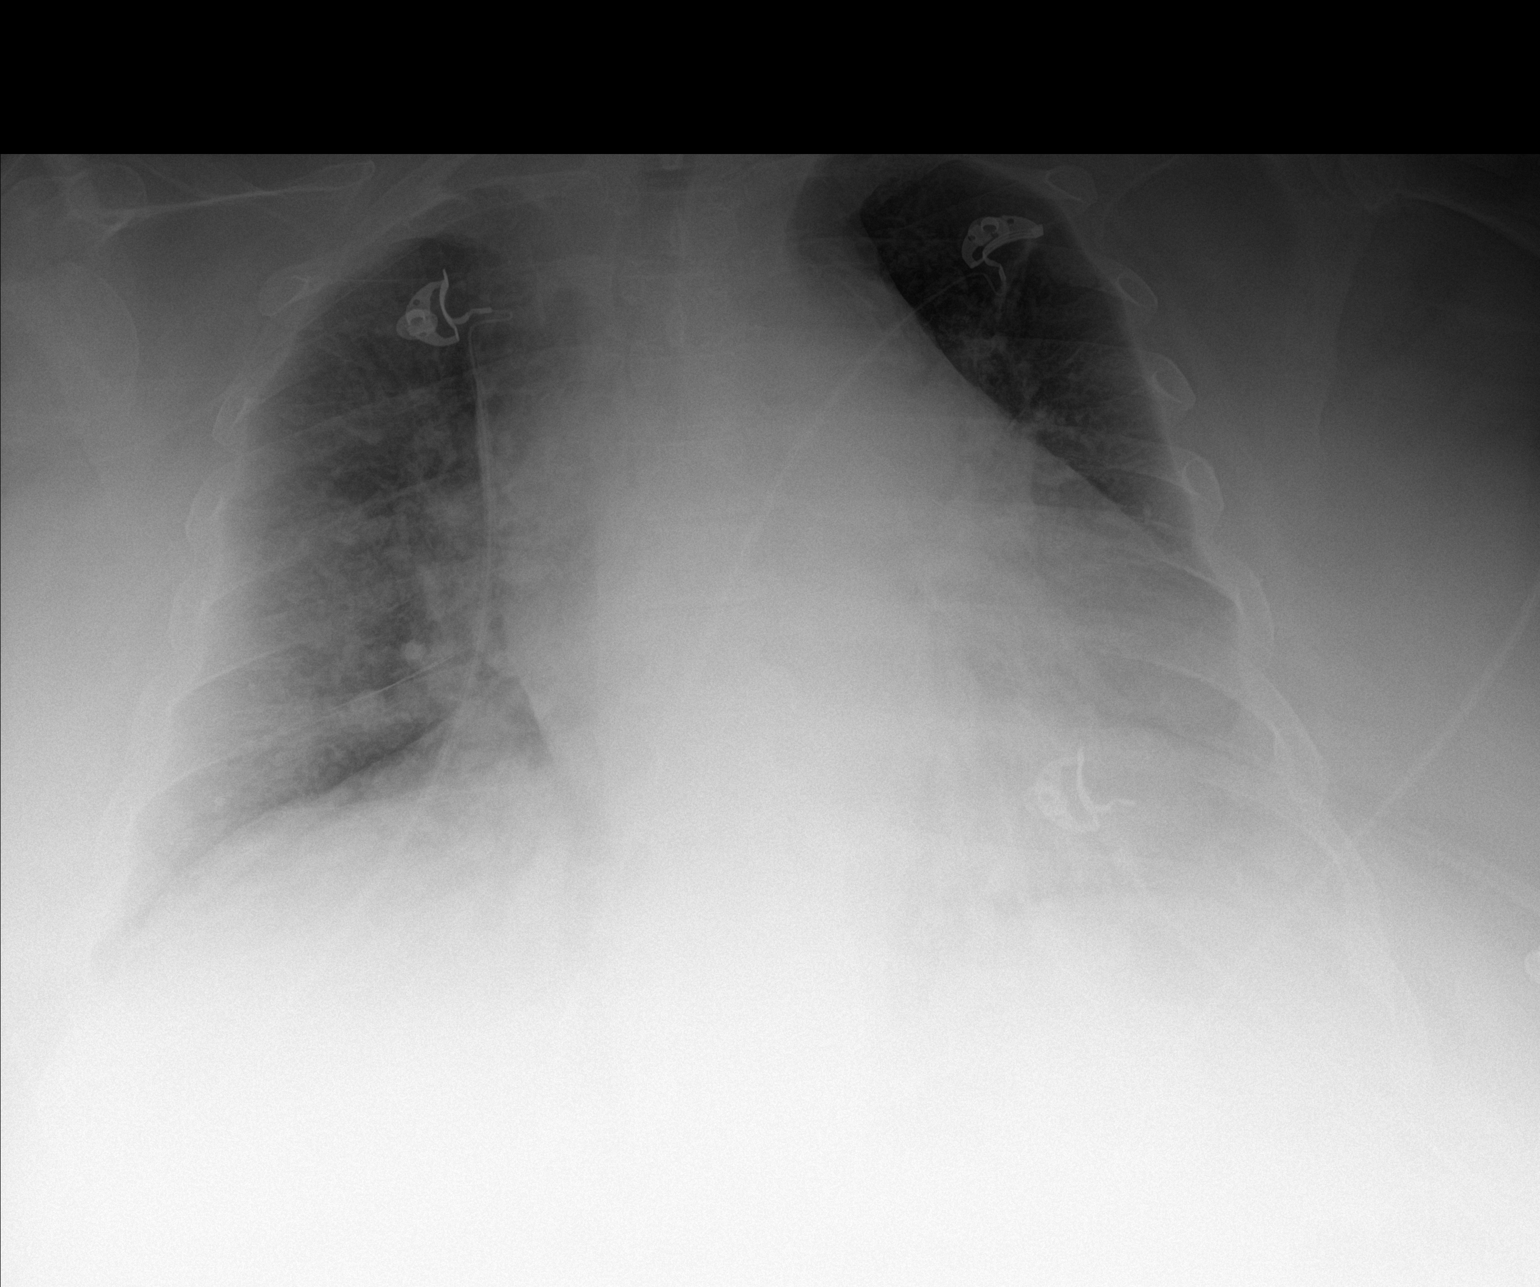

[1 of 1 positions shown; findings below may reference images not displayed]

FINDINGS: Exam is lordotic. Stable enlarged cardiac silhouette. Lung bases
poorly evaluated. Central venous congestion noted. No overt
pulmonary edema. No infiltrate identified.
IMPRESSION: Somewhat limited exam.  Cardiomegaly and central venous congestion.

## 2019-08-19 IMAGING — DX DG CHEST 1V PORT
1 series · 1 of 1 positions shown · non-contrast
Comparison: Radiograph February 19, 2018.

CLINICAL DATA: Atelectasis.

EXAM:
PORTABLE CHEST 1 VIEW

[chest ap]
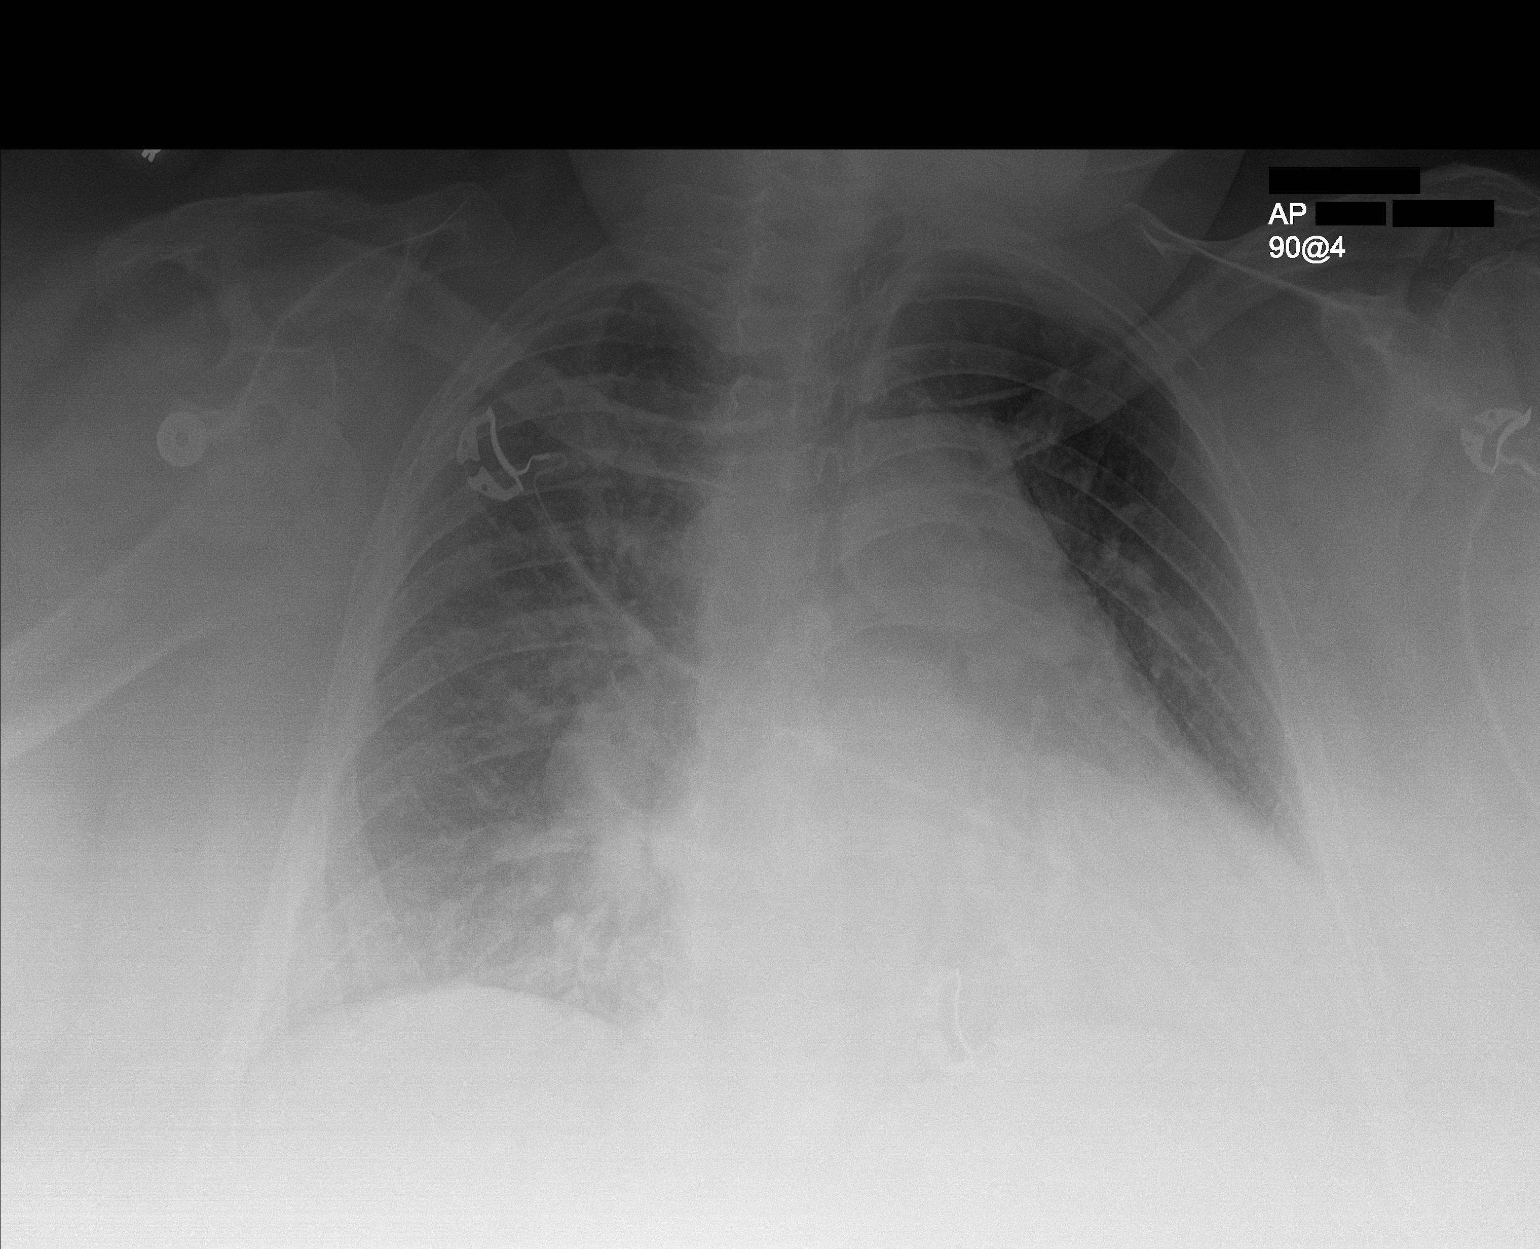

[1 of 1 positions shown; findings below may reference images not displayed]

FINDINGS: Stable cardiomegaly with central pulmonary vascular congestion is
noted. No pneumothorax or pleural effusion is noted. No
consolidative process is noted. Chronic dislocation of right
shoulder is noted.
IMPRESSION: Stable cardiomegaly with central pulmonary vascular congestion.
